# Patient Record
Sex: Female | Born: 1980
Health system: Southern US, Community
[De-identification: ages and names within clinical notes are randomized; demographics above are authoritative.]

## PROBLEM LIST (undated history)

## (undated) DIAGNOSIS — Z8669 Personal history of other diseases of the nervous system and sense organs: Secondary | ICD-10-CM

## (undated) DIAGNOSIS — I10 Essential (primary) hypertension: Secondary | ICD-10-CM

## (undated) DIAGNOSIS — N200 Calculus of kidney: Secondary | ICD-10-CM

## (undated) DIAGNOSIS — R519 Headache, unspecified: Secondary | ICD-10-CM

## (undated) HISTORY — PX: KIDNEY STONE SURGERY: SHX686

## (undated) HISTORY — DX: Personal history of other diseases of the nervous system and sense organs: Z86.69

---

## 2010-08-22 HISTORY — PX: TUBAL LIGATION: SHX77

## 2015-12-14 DIAGNOSIS — D259 Leiomyoma of uterus, unspecified: Secondary | ICD-10-CM | POA: Insufficient documentation

## 2015-12-14 DIAGNOSIS — Z8679 Personal history of other diseases of the circulatory system: Secondary | ICD-10-CM | POA: Insufficient documentation

## 2015-12-14 DIAGNOSIS — N83209 Unspecified ovarian cyst, unspecified side: Secondary | ICD-10-CM | POA: Insufficient documentation

## 2015-12-14 DIAGNOSIS — M222X1 Patellofemoral disorders, right knee: Secondary | ICD-10-CM | POA: Insufficient documentation

## 2016-04-28 ENCOUNTER — Other Ambulatory Visit: Payer: Self-pay | Admitting: Internal Medicine

## 2016-04-28 DIAGNOSIS — N6310 Unspecified lump in the right breast, unspecified quadrant: Secondary | ICD-10-CM

## 2016-05-03 ENCOUNTER — Ambulatory Visit
Admission: RE | Admit: 2016-05-03 | Discharge: 2016-05-03 | Disposition: A | Discharge status: Home / Self Care | Payer: Commercial Managed Care - PPO | Source: Ambulatory Visit | Attending: Internal Medicine | Admitting: Internal Medicine

## 2016-05-03 ENCOUNTER — Other Ambulatory Visit: Payer: Self-pay | Admitting: Internal Medicine

## 2016-05-03 DIAGNOSIS — N6310 Unspecified lump in the right breast, unspecified quadrant: Secondary | ICD-10-CM

## 2016-05-26 ENCOUNTER — Other Ambulatory Visit: Payer: Self-pay | Admitting: Internal Medicine

## 2016-05-26 DIAGNOSIS — N6310 Unspecified lump in the right breast, unspecified quadrant: Secondary | ICD-10-CM

## 2016-05-27 ENCOUNTER — Ambulatory Visit
Admission: RE | Admit: 2016-05-27 | Discharge: 2016-05-27 | Disposition: A | Discharge status: Home / Self Care | Payer: Commercial Managed Care - PPO | Source: Ambulatory Visit | Attending: Internal Medicine | Admitting: Internal Medicine

## 2016-05-27 DIAGNOSIS — N6310 Unspecified lump in the right breast, unspecified quadrant: Secondary | ICD-10-CM

## 2016-06-06 DIAGNOSIS — D241 Benign neoplasm of right breast: Secondary | ICD-10-CM | POA: Insufficient documentation

## 2016-11-04 DIAGNOSIS — G43719 Chronic migraine without aura, intractable, without status migrainosus: Secondary | ICD-10-CM | POA: Diagnosis not present

## 2016-11-04 DIAGNOSIS — M791 Myalgia: Secondary | ICD-10-CM | POA: Diagnosis not present

## 2016-11-04 DIAGNOSIS — M542 Cervicalgia: Secondary | ICD-10-CM | POA: Diagnosis not present

## 2016-11-04 DIAGNOSIS — R51 Headache: Secondary | ICD-10-CM | POA: Diagnosis not present

## 2016-12-05 DIAGNOSIS — Z124 Encounter for screening for malignant neoplasm of cervix: Secondary | ICD-10-CM | POA: Diagnosis not present

## 2016-12-05 DIAGNOSIS — Z01419 Encounter for gynecological examination (general) (routine) without abnormal findings: Secondary | ICD-10-CM | POA: Diagnosis not present

## 2017-01-04 DIAGNOSIS — N760 Acute vaginitis: Secondary | ICD-10-CM | POA: Diagnosis not present

## 2017-01-04 DIAGNOSIS — E559 Vitamin D deficiency, unspecified: Secondary | ICD-10-CM | POA: Diagnosis not present

## 2017-01-09 DIAGNOSIS — R05 Cough: Secondary | ICD-10-CM | POA: Diagnosis not present

## 2017-01-09 DIAGNOSIS — M79643 Pain in unspecified hand: Secondary | ICD-10-CM | POA: Diagnosis not present

## 2017-01-09 DIAGNOSIS — I10 Essential (primary) hypertension: Secondary | ICD-10-CM | POA: Diagnosis not present

## 2017-01-09 DIAGNOSIS — R7309 Other abnormal glucose: Secondary | ICD-10-CM | POA: Diagnosis not present

## 2017-01-09 DIAGNOSIS — M79642 Pain in left hand: Secondary | ICD-10-CM | POA: Diagnosis not present

## 2017-01-09 DIAGNOSIS — M79641 Pain in right hand: Secondary | ICD-10-CM | POA: Diagnosis not present

## 2017-01-31 DIAGNOSIS — M791 Myalgia: Secondary | ICD-10-CM | POA: Diagnosis not present

## 2017-01-31 DIAGNOSIS — R51 Headache: Secondary | ICD-10-CM | POA: Diagnosis not present

## 2017-01-31 DIAGNOSIS — G43719 Chronic migraine without aura, intractable, without status migrainosus: Secondary | ICD-10-CM | POA: Diagnosis not present

## 2017-01-31 DIAGNOSIS — M542 Cervicalgia: Secondary | ICD-10-CM | POA: Diagnosis not present

## 2017-02-02 DIAGNOSIS — G5600 Carpal tunnel syndrome, unspecified upper limb: Secondary | ICD-10-CM | POA: Diagnosis not present

## 2017-04-10 DIAGNOSIS — E559 Vitamin D deficiency, unspecified: Secondary | ICD-10-CM | POA: Diagnosis not present

## 2017-05-01 DIAGNOSIS — N760 Acute vaginitis: Secondary | ICD-10-CM | POA: Diagnosis not present

## 2017-05-01 DIAGNOSIS — Z0000 Encounter for general adult medical examination without abnormal findings: Secondary | ICD-10-CM | POA: Diagnosis not present

## 2017-05-05 DIAGNOSIS — M542 Cervicalgia: Secondary | ICD-10-CM | POA: Diagnosis not present

## 2017-05-05 DIAGNOSIS — R51 Headache: Secondary | ICD-10-CM | POA: Diagnosis not present

## 2017-05-05 DIAGNOSIS — G43719 Chronic migraine without aura, intractable, without status migrainosus: Secondary | ICD-10-CM | POA: Diagnosis not present

## 2017-05-05 DIAGNOSIS — M791 Myalgia: Secondary | ICD-10-CM | POA: Diagnosis not present

## 2017-08-11 DIAGNOSIS — M791 Myalgia, unspecified site: Secondary | ICD-10-CM | POA: Diagnosis not present

## 2017-08-11 DIAGNOSIS — M542 Cervicalgia: Secondary | ICD-10-CM | POA: Diagnosis not present

## 2017-08-11 DIAGNOSIS — G43719 Chronic migraine without aura, intractable, without status migrainosus: Secondary | ICD-10-CM | POA: Diagnosis not present

## 2017-08-11 DIAGNOSIS — R51 Headache: Secondary | ICD-10-CM | POA: Diagnosis not present

## 2017-10-12 DIAGNOSIS — G43719 Chronic migraine without aura, intractable, without status migrainosus: Secondary | ICD-10-CM | POA: Diagnosis not present

## 2017-10-12 DIAGNOSIS — M542 Cervicalgia: Secondary | ICD-10-CM | POA: Diagnosis not present

## 2017-10-12 DIAGNOSIS — R51 Headache: Secondary | ICD-10-CM | POA: Diagnosis not present

## 2017-10-12 DIAGNOSIS — M791 Myalgia, unspecified site: Secondary | ICD-10-CM | POA: Diagnosis not present

## 2017-11-21 DIAGNOSIS — M791 Myalgia, unspecified site: Secondary | ICD-10-CM | POA: Diagnosis not present

## 2017-11-21 DIAGNOSIS — M542 Cervicalgia: Secondary | ICD-10-CM | POA: Diagnosis not present

## 2017-11-21 DIAGNOSIS — G43719 Chronic migraine without aura, intractable, without status migrainosus: Secondary | ICD-10-CM | POA: Diagnosis not present

## 2017-11-21 DIAGNOSIS — R51 Headache: Secondary | ICD-10-CM | POA: Diagnosis not present

## 2017-12-11 DIAGNOSIS — Z01419 Encounter for gynecological examination (general) (routine) without abnormal findings: Secondary | ICD-10-CM | POA: Diagnosis not present

## 2017-12-11 DIAGNOSIS — Z124 Encounter for screening for malignant neoplasm of cervix: Secondary | ICD-10-CM | POA: Diagnosis not present

## 2017-12-11 DIAGNOSIS — E559 Vitamin D deficiency, unspecified: Secondary | ICD-10-CM | POA: Diagnosis not present

## 2017-12-11 DIAGNOSIS — R87610 Atypical squamous cells of undetermined significance on cytologic smear of cervix (ASC-US): Secondary | ICD-10-CM | POA: Diagnosis not present

## 2018-03-13 DIAGNOSIS — R51 Headache: Secondary | ICD-10-CM | POA: Diagnosis not present

## 2018-03-13 DIAGNOSIS — G43719 Chronic migraine without aura, intractable, without status migrainosus: Secondary | ICD-10-CM | POA: Diagnosis not present

## 2018-03-13 DIAGNOSIS — M542 Cervicalgia: Secondary | ICD-10-CM | POA: Diagnosis not present

## 2018-03-13 DIAGNOSIS — M791 Myalgia, unspecified site: Secondary | ICD-10-CM | POA: Diagnosis not present

## 2018-05-04 ENCOUNTER — Other Ambulatory Visit: Payer: Self-pay

## 2018-05-04 ENCOUNTER — Emergency Department (HOSPITAL_COMMUNITY)
Admission: EM | Admit: 2018-05-04 | Discharge: 2018-05-05 | Disposition: A | Discharge status: Home / Self Care | Payer: Commercial Managed Care - PPO | Attending: Emergency Medicine | Admitting: Emergency Medicine

## 2018-05-04 ENCOUNTER — Encounter (HOSPITAL_COMMUNITY): Payer: Self-pay | Admitting: Emergency Medicine

## 2018-05-04 DIAGNOSIS — R1031 Right lower quadrant pain: Secondary | ICD-10-CM | POA: Diagnosis present

## 2018-05-04 DIAGNOSIS — N200 Calculus of kidney: Secondary | ICD-10-CM | POA: Diagnosis not present

## 2018-05-04 HISTORY — DX: Calculus of kidney: N20.0

## 2018-05-04 LAB — LIPASE, BLOOD: LIPASE: 27 U/L (ref 11–51)

## 2018-05-04 LAB — COMPREHENSIVE METABOLIC PANEL
ALT: 17 U/L (ref 0–44)
AST: 23 U/L (ref 15–41)
Albumin: 4.1 g/dL (ref 3.5–5.0)
Alkaline Phosphatase: 63 U/L (ref 38–126)
Anion gap: 12 (ref 5–15)
BILIRUBIN TOTAL: 0.7 mg/dL (ref 0.3–1.2)
BUN: 14 mg/dL (ref 6–20)
CHLORIDE: 111 mmol/L (ref 98–111)
CO2: 17 mmol/L — ABNORMAL LOW (ref 22–32)
Calcium: 9.3 mg/dL (ref 8.9–10.3)
Creatinine, Ser: 1.24 mg/dL — ABNORMAL HIGH (ref 0.44–1.00)
GFR calc Af Amer: >60 mL/min (ref >60)
GFR calc non Af Amer: 55 mL/min — ABNORMAL LOW (ref >60)
GLUCOSE: 146 mg/dL — AB (ref 70–99)
POTASSIUM: 3.3 mmol/L — AB (ref 3.5–5.1)
Sodium: 140 mmol/L (ref 135–145)
TOTAL PROTEIN: 7.4 g/dL (ref 6.5–8.1)

## 2018-05-04 LAB — URINALYSIS, ROUTINE W REFLEX MICROSCOPIC
Bacteria, UA: NONE SEEN
Bilirubin Urine: NEGATIVE
GLUCOSE, UA: NEGATIVE mg/dL
Ketones, ur: NEGATIVE mg/dL
Leukocytes, UA: NEGATIVE
NITRITE: NEGATIVE
Protein, ur: NEGATIVE mg/dL
SPECIFIC GRAVITY, URINE: 1.013 (ref 1.005–1.030)
pH: 7 (ref 5.0–8.0)

## 2018-05-04 LAB — I-STAT BETA HCG BLOOD, ED (MC, WL, AP ONLY): I-stat hCG, quantitative: <5 m[IU]/mL (ref <5)

## 2018-05-04 LAB — CBC
HEMATOCRIT: 38.4 % (ref 36.0–46.0)
HEMOGLOBIN: 12.3 g/dL (ref 12.0–15.0)
MCH: 30.2 pg (ref 26.0–34.0)
MCHC: 32 g/dL (ref 30.0–36.0)
MCV: 94.3 fL (ref 78.0–100.0)
Platelets: 296 10*3/uL (ref 150–400)
RBC: 4.07 MIL/uL (ref 3.87–5.11)
RDW: 11.7 % (ref 11.5–15.5)
WBC: 15.7 10*3/uL — ABNORMAL HIGH (ref 4.0–10.5)

## 2018-05-04 MED ORDER — ONDANSETRON 4 MG PO TBDP
4.0000 mg | ORAL_TABLET | Freq: Once | ORAL | Status: AC | PRN
  Administered 2018-05-04: 4 mg via ORAL
  Filled 2018-05-04: qty 1

## 2018-05-04 MED ORDER — OXYCODONE-ACETAMINOPHEN 5-325 MG PO TABS
1.0000 | ORAL_TABLET | ORAL | Status: DC | PRN
  Administered 2018-05-04: 1 via ORAL
  Filled 2018-05-04: qty 1

## 2018-05-04 NOTE — ED Triage Notes (Signed)
Pt reports sharp, shooting RLQ pain, radiating R flank pain. Pt reports 13 episodes of emesis. Pt reports hx of kidney stones. Pt reports son was sick 1 week ago with same symptoms.

## 2018-05-05 ENCOUNTER — Emergency Department (HOSPITAL_COMMUNITY): Payer: Commercial Managed Care - PPO

## 2018-05-05 DIAGNOSIS — K449 Diaphragmatic hernia without obstruction or gangrene: Secondary | ICD-10-CM | POA: Diagnosis not present

## 2018-05-05 DIAGNOSIS — N200 Calculus of kidney: Secondary | ICD-10-CM | POA: Diagnosis not present

## 2018-05-05 MED ORDER — HYDROCODONE-ACETAMINOPHEN 5-325 MG PO TABS: 1.0000 | ORAL_TABLET | Freq: Four times a day (QID) | ORAL | 0 refills | Status: DC | PRN

## 2018-05-05 MED ORDER — KETOROLAC TROMETHAMINE 30 MG/ML IJ SOLN
30.0000 mg | Freq: Once | INTRAMUSCULAR | Status: AC
  Administered 2018-05-05: 30 mg via INTRAVENOUS
  Filled 2018-05-05: qty 1

## 2018-05-05 MED ORDER — ONDANSETRON HCL 4 MG/2ML IJ SOLN
4.0000 mg | Freq: Once | INTRAMUSCULAR | Status: AC
  Administered 2018-05-05: 4 mg via INTRAVENOUS
  Filled 2018-05-05: qty 2

## 2018-05-05 MED ORDER — ONDANSETRON 4 MG PO TBDP: 4.0000 mg | ORAL_TABLET | Freq: Three times a day (TID) | ORAL | 0 refills | Status: DC | PRN

## 2018-05-05 MED ORDER — HYDROMORPHONE HCL 1 MG/ML IJ SOLN
1.0000 mg | Freq: Once | INTRAMUSCULAR | Status: AC
  Administered 2018-05-05: 1 mg via INTRAVENOUS
  Filled 2018-05-05: qty 1

## 2018-05-05 NOTE — ED Provider Notes (Signed)
[facility] EMERGENCY DEPARTMENT Provider Note   CSN: [id number] Arrival date & time: 05/04/18  2116     History   Chief Complaint Chief Complaint  Patient presents with  . Abdominal Pain  . Emesis    HPI Wendy Parks is a 37 y.o. female.  Patient presents to the emergency department with a chief complaint of abdominal pain.  She reports having right flank pain.  States that the symptoms started today.  She rates her pain is a 10 out of 10.  She states that it feels like prior kidney stone.  She reports urinary hesitancy.  She denies any dysuria.  She reports associated nausea, but denies any vomiting or diarrhea.  She denies any vaginal discharge or bleeding.  She has not taken anything for symptoms.  She reports that her prior kidney stones required surgical intervention.  The history is provided by the patient. No language interpreter was used.    Past Medical History:  Diagnosis Date  . Kidney stone     There are no active problems to display for this patient.   Past Surgical History:  Procedure Laterality Date  . KIDNEY STONE SURGERY    . TUBAL LIGATION  2012     OB History   None      Home Medications    Prior to Admission medications   Not on File    Family History History reviewed. No pertinent family history.  Social History Social History   Tobacco Use  . Smoking status: Never Smoker  . Smokeless tobacco: Never Used  Substance Use Topics  . Alcohol use: Yes    Comment: occasionally  . Drug use: Never     Allergies   Patient has no known allergies.   Review of Systems Review of Systems  All other systems reviewed and are negative.    Physical Exam Updated Vital Signs BP 130/81   Pulse 71   Temp 98.6 F (37 C)   Resp 18   Ht 5\' 6"  (1.676 m)   Wt 79.8 kg   LMP 04/03/2018   SpO2 100%   BMI 28.41 kg/m   Physical Exam  Constitutional: She is oriented to person, place, and time. She appears  well-developed and well-nourished.  HENT:  Head: Normocephalic and atraumatic.  Eyes: Pupils are equal, round, and reactive to light. Conjunctivae and EOM are normal.  Neck: Normal range of motion. Neck supple.  Cardiovascular: Normal rate and regular rhythm. Exam reveals no gallop and no friction rub.  No murmur heard. Pulmonary/Chest: Effort normal and breath sounds normal. No respiratory distress. She has no wheezes. She has no rales. She exhibits no tenderness.  Abdominal: Soft. Bowel sounds are normal. She exhibits no distension and no mass. There is tenderness. There is no rebound and no guarding.  Generalized abdominal discomfort Right CVA tenderness  Musculoskeletal: Normal range of motion. She exhibits no edema or tenderness.  Neurological: She is alert and oriented to person, place, and time.  Skin: Skin is warm and dry.  Psychiatric: She has a normal mood and affect. Her behavior is normal. Judgment and thought content normal.  Nursing note and vitals reviewed.    ED Treatments / Results  Labs (all labs ordered are listed, but only abnormal results are displayed) Labs Reviewed  COMPREHENSIVE METABOLIC PANEL - Abnormal; Notable for the following components:      Result Value   Potassium 3.3 (*)    CO2 17 (*)    Glucose,  Bld 146 (*)    Creatinine, Ser 1.24 (*)    GFR calc non Af Amer 55 (*)    All other components within normal limits  CBC - Abnormal; Notable for the following components:   WBC 15.7 (*)    All other components within normal limits  URINALYSIS, ROUTINE W REFLEX MICROSCOPIC - Abnormal; Notable for the following components:   APPearance CLOUDY (*)    Hgb urine dipstick SMALL (*)    All other components within normal limits  LIPASE, BLOOD  I-STAT BETA HCG BLOOD, ED ([facility], [facility], [facility] ONLY)    EKG None  Radiology Ct Renal Stone Study  Result Date: 05/05/2018 CLINICAL DATA:  Right lower quadrant and right flank pain. Emesis. History kidney stones. EXAM: CT  ABDOMEN AND PELVIS WITHOUT CONTRAST TECHNIQUE: Multidetector CT imaging of the abdomen and pelvis was performed following the standard protocol without IV contrast. COMPARISON:  None. FINDINGS: Lower chest: Clear lung bases. Normal heart size without pericardial or pleural effusion. Hepatobiliary: Normal liver. Normal gallbladder, without biliary ductal dilatation. Pancreas: Normal, without mass or ductal dilatation. Spleen: Normal in size, without focal abnormality. Adrenals/Urinary Tract: Normal adrenal glands. No renal calculi. Mild to moderate right renal and perirenal edema. Right hydroureter and Periureteric edema. Hydroureter is followed to the level of a distal right ureteric 5 mm stone on image 79/3 and coronal image 58. No bladder calculi. Stomach/Bowel: Tiny hiatal hernia. Normal colon, appendix, and terminal ileum. Normal small bowel. Vascular/Lymphatic: Normal caliber of the aorta and branch vessels. No abdominopelvic adenopathy. Reproductive: Lobulated uterus, consistent with underlying fibroids. A uterine body lesion is exophytic inferiorly and measures 5.2 x 3.2 cm on sagittal image 60. No adnexal mass. Other: Trace free pelvic fluid is likely physiologic. Prior tubal ligation. Musculoskeletal: No acute osseous abnormality. IMPRESSION: 1. Mild to moderate urinary tract obstruction secondary to a distal right ureteric 5 mm stone. 2. Fibroid uterus. 3.  Tiny hiatal hernia. Electronically Signed   By: Abigail Miyamoto M.D.   On: 05/05/2018 03:02    Procedures Procedures (including critical care time)  Medications Ordered in ED Medications  oxyCODONE-acetaminophen (PERCOCET/ROXICET) 5-325 MG per tablet 1 tablet (1 tablet Oral Given 05/04/18 2132)  ondansetron (ZOFRAN-ODT) disintegrating tablet 4 mg (4 mg Oral Given 05/04/18 2132)  HYDROmorphone (DILAUDID) injection 1 mg (1 mg Intravenous Given 05/05/18 0233)  ondansetron (ZOFRAN) injection 4 mg (4 mg Intravenous Given 05/05/18 0233)     Initial  Impression / Assessment and Plan / ED Course  I have reviewed the triage vital signs and the nursing notes.  Pertinent labs & imaging results that were available during my care of the patient were reviewed by me and considered in my medical decision making (see chart for details).     Patient with right flank pain and generalized abdominal pain.  History of kidney stones. States that this feels similar.  Leukocytosis to 15.7.  He does have hemoglobin and RBCs in her urine.  Given that she has required surgical intervention for kidney stones in the past, and she does have significant abdominal tenderness, feel that CT is indicated for further differentiation.  CT shows right 5 mm ureteral stone.  Pain significantly improved in ED.  Urinalysis is not consistent with infection.  Leukocytosis likely reactive.  Will refer to urology.  Will discharge home with pain and nausea medicine.  Return precautions given.  Final Clinical Impressions(s) / ED Diagnoses   Final diagnoses:  Kidney stone    ED Discharge Orders  Ordered    HYDROcodone-acetaminophen (NORCO/VICODIN) 5-325 MG tablet  Every 6 hours PRN     05/05/18 0334    ondansetron (ZOFRAN ODT) 4 MG disintegrating tablet  Every 8 hours PRN     05/05/18 0334           Montine Circle, PA-C 05/05/18 0335    Veryl Speak, MD 05/05/18 [phone]

## 2018-05-08 DIAGNOSIS — Z09 Encounter for follow-up examination after completed treatment for conditions other than malignant neoplasm: Secondary | ICD-10-CM | POA: Diagnosis not present

## 2018-05-08 DIAGNOSIS — N200 Calculus of kidney: Secondary | ICD-10-CM | POA: Diagnosis not present

## 2018-05-08 DIAGNOSIS — Z1231 Encounter for screening mammogram for malignant neoplasm of breast: Secondary | ICD-10-CM | POA: Diagnosis not present

## 2018-05-08 DIAGNOSIS — Z0000 Encounter for general adult medical examination without abnormal findings: Secondary | ICD-10-CM | POA: Diagnosis not present

## 2018-05-09 DIAGNOSIS — N201 Calculus of ureter: Secondary | ICD-10-CM | POA: Diagnosis not present

## 2018-06-15 DIAGNOSIS — R51 Headache: Secondary | ICD-10-CM | POA: Diagnosis not present

## 2018-06-15 DIAGNOSIS — M542 Cervicalgia: Secondary | ICD-10-CM | POA: Diagnosis not present

## 2018-06-15 DIAGNOSIS — G43719 Chronic migraine without aura, intractable, without status migrainosus: Secondary | ICD-10-CM | POA: Diagnosis not present

## 2018-06-15 DIAGNOSIS — M791 Myalgia, unspecified site: Secondary | ICD-10-CM | POA: Diagnosis not present

## 2018-09-07 DIAGNOSIS — G518 Other disorders of facial nerve: Secondary | ICD-10-CM | POA: Diagnosis not present

## 2018-09-07 DIAGNOSIS — M542 Cervicalgia: Secondary | ICD-10-CM | POA: Diagnosis not present

## 2018-09-07 DIAGNOSIS — M791 Myalgia, unspecified site: Secondary | ICD-10-CM | POA: Diagnosis not present

## 2018-09-07 DIAGNOSIS — G43719 Chronic migraine without aura, intractable, without status migrainosus: Secondary | ICD-10-CM | POA: Diagnosis not present

## 2018-10-17 DIAGNOSIS — H04123 Dry eye syndrome of bilateral lacrimal glands: Secondary | ICD-10-CM | POA: Diagnosis not present

## 2018-10-17 DIAGNOSIS — H02824 Cysts of left upper eyelid: Secondary | ICD-10-CM | POA: Diagnosis not present

## 2018-11-29 DIAGNOSIS — H0014 Chalazion left upper eyelid: Secondary | ICD-10-CM | POA: Diagnosis not present

## 2018-11-29 DIAGNOSIS — D485 Neoplasm of uncertain behavior of skin: Secondary | ICD-10-CM | POA: Diagnosis not present

## 2018-12-07 DIAGNOSIS — M791 Myalgia, unspecified site: Secondary | ICD-10-CM | POA: Diagnosis not present

## 2018-12-07 DIAGNOSIS — M542 Cervicalgia: Secondary | ICD-10-CM | POA: Diagnosis not present

## 2018-12-07 DIAGNOSIS — G43719 Chronic migraine without aura, intractable, without status migrainosus: Secondary | ICD-10-CM | POA: Diagnosis not present

## 2018-12-07 DIAGNOSIS — G518 Other disorders of facial nerve: Secondary | ICD-10-CM | POA: Diagnosis not present

## 2018-12-25 DIAGNOSIS — Z124 Encounter for screening for malignant neoplasm of cervix: Secondary | ICD-10-CM | POA: Diagnosis not present

## 2018-12-25 DIAGNOSIS — E559 Vitamin D deficiency, unspecified: Secondary | ICD-10-CM | POA: Diagnosis not present

## 2018-12-25 DIAGNOSIS — B009 Herpesviral infection, unspecified: Secondary | ICD-10-CM | POA: Insufficient documentation

## 2018-12-25 DIAGNOSIS — R87610 Atypical squamous cells of undetermined significance on cytologic smear of cervix (ASC-US): Secondary | ICD-10-CM | POA: Diagnosis not present

## 2018-12-25 DIAGNOSIS — Z01419 Encounter for gynecological examination (general) (routine) without abnormal findings: Secondary | ICD-10-CM | POA: Diagnosis not present

## 2018-12-25 DIAGNOSIS — N920 Excessive and frequent menstruation with regular cycle: Secondary | ICD-10-CM | POA: Diagnosis not present

## 2018-12-25 DIAGNOSIS — Z113 Encounter for screening for infections with a predominantly sexual mode of transmission: Secondary | ICD-10-CM | POA: Diagnosis not present

## 2018-12-25 DIAGNOSIS — D259 Leiomyoma of uterus, unspecified: Secondary | ICD-10-CM | POA: Diagnosis not present

## 2018-12-25 HISTORY — DX: Herpesviral infection, unspecified: B00.9

## 2019-01-07 ENCOUNTER — Other Ambulatory Visit: Payer: Self-pay | Admitting: Obstetrics and Gynecology

## 2019-01-07 DIAGNOSIS — N920 Excessive and frequent menstruation with regular cycle: Secondary | ICD-10-CM | POA: Diagnosis not present

## 2019-01-21 DIAGNOSIS — Z1159 Encounter for screening for other viral diseases: Secondary | ICD-10-CM | POA: Diagnosis not present

## 2019-01-21 HISTORY — PX: ENDOMETRIAL ABLATION: SHX621

## 2019-01-25 ENCOUNTER — Other Ambulatory Visit: Payer: Self-pay | Admitting: Obstetrics and Gynecology

## 2019-01-25 DIAGNOSIS — N920 Excessive and frequent menstruation with regular cycle: Secondary | ICD-10-CM | POA: Diagnosis not present

## 2019-03-08 DIAGNOSIS — M791 Myalgia, unspecified site: Secondary | ICD-10-CM | POA: Diagnosis not present

## 2019-03-08 DIAGNOSIS — G518 Other disorders of facial nerve: Secondary | ICD-10-CM | POA: Diagnosis not present

## 2019-03-08 DIAGNOSIS — M542 Cervicalgia: Secondary | ICD-10-CM | POA: Diagnosis not present

## 2019-03-08 DIAGNOSIS — G43719 Chronic migraine without aura, intractable, without status migrainosus: Secondary | ICD-10-CM | POA: Diagnosis not present

## 2019-05-23 ENCOUNTER — Other Ambulatory Visit: Payer: Self-pay

## 2019-05-23 ENCOUNTER — Telehealth: Payer: Commercial Managed Care - PPO | Admitting: Nurse Practitioner

## 2019-05-23 DIAGNOSIS — Z20822 Contact with and (suspected) exposure to covid-19: Secondary | ICD-10-CM

## 2019-05-23 DIAGNOSIS — Z20828 Contact with and (suspected) exposure to other viral communicable diseases: Secondary | ICD-10-CM | POA: Diagnosis not present

## 2019-05-24 LAB — NOVEL CORONAVIRUS, NAA: SARS-CoV-2, NAA: NOT DETECTED

## 2019-05-27 ENCOUNTER — Encounter: Payer: Commercial Managed Care - PPO | Admitting: Internal Medicine

## 2019-06-14 DIAGNOSIS — M791 Myalgia, unspecified site: Secondary | ICD-10-CM | POA: Diagnosis not present

## 2019-06-14 DIAGNOSIS — G43719 Chronic migraine without aura, intractable, without status migrainosus: Secondary | ICD-10-CM | POA: Diagnosis not present

## 2019-06-14 DIAGNOSIS — M542 Cervicalgia: Secondary | ICD-10-CM | POA: Diagnosis not present

## 2019-06-14 DIAGNOSIS — G518 Other disorders of facial nerve: Secondary | ICD-10-CM | POA: Diagnosis not present

## 2019-06-19 ENCOUNTER — Encounter: Payer: Self-pay | Admitting: Internal Medicine

## 2019-06-20 ENCOUNTER — Other Ambulatory Visit: Payer: Self-pay

## 2019-06-20 ENCOUNTER — Other Ambulatory Visit: Payer: BC Managed Care – PPO

## 2019-06-24 ENCOUNTER — Other Ambulatory Visit: Payer: Self-pay

## 2019-06-24 ENCOUNTER — Ambulatory Visit: Payer: BC Managed Care – PPO | Admitting: Internal Medicine

## 2019-06-24 ENCOUNTER — Encounter: Payer: Self-pay | Admitting: Internal Medicine

## 2019-06-24 VITALS — BP 110/84 | HR 90 | Temp 98.7°F | Ht 66.5 in | Wt 192.2 lb

## 2019-06-24 DIAGNOSIS — Z0000 Encounter for general adult medical examination without abnormal findings: Secondary | ICD-10-CM

## 2019-06-24 DIAGNOSIS — N200 Calculus of kidney: Secondary | ICD-10-CM | POA: Insufficient documentation

## 2019-06-24 LAB — COMPREHENSIVE METABOLIC PANEL
ALT: 9 IU/L (ref 0–32)
AST: 12 IU/L (ref 0–40)
Albumin/Globulin Ratio: 1.6 (ref 1.2–2.2)
Albumin: 4.6 g/dL (ref 3.8–4.8)
Alkaline Phosphatase: 79 IU/L (ref 39–117)
BUN/Creatinine Ratio: 18 (ref 9–23)
BUN: 17 mg/dL (ref 6–20)
Bilirubin Total: 0.4 mg/dL (ref 0.0–1.2)
CO2: 19 mmol/L — ABNORMAL LOW (ref 20–29)
Calcium: 9.6 mg/dL (ref 8.7–10.2)
Chloride: 108 mmol/L — ABNORMAL HIGH (ref 96–106)
Creatinine, Ser: 0.96 mg/dL (ref 0.57–1.00)
GFR calc Af Amer: 87 mL/min/{1.73_m2} (ref >59)
GFR calc non Af Amer: 75 mL/min/{1.73_m2} (ref >59)
Globulin, Total: 2.9 g/dL (ref 1.5–4.5)
Glucose: 95 mg/dL (ref 65–99)
Potassium: 4.1 mmol/L (ref 3.5–5.2)
Sodium: 140 mmol/L (ref 134–144)
Total Protein: 7.5 g/dL (ref 6.0–8.5)

## 2019-06-24 LAB — LIPID PANEL W/O CHOL/HDL RATIO
Cholesterol, Total: 181 mg/dL (ref 100–199)
HDL: 65 mg/dL (ref >39)
LDL Chol Calc (NIH): 102 mg/dL — ABNORMAL HIGH (ref 0–99)
Triglycerides: 74 mg/dL (ref 0–149)
VLDL Cholesterol Cal: 14 mg/dL (ref 5–40)

## 2019-06-24 LAB — CBC
Hematocrit: 38.6 % (ref 34.0–46.6)
Hemoglobin: 13.1 g/dL (ref 11.1–15.9)
MCH: 29.7 pg (ref 26.6–33.0)
MCHC: 33.9 g/dL (ref 31.5–35.7)
MCV: 88 fL (ref 79–97)
Platelets: 302 10*3/uL (ref 150–450)
RBC: 4.41 x10E6/uL (ref 3.77–5.28)
RDW: 11.8 % (ref 11.7–15.4)
WBC: 11.1 10*3/uL — ABNORMAL HIGH (ref 3.4–10.8)

## 2019-06-24 LAB — POCT URINALYSIS DIPSTICK
Bilirubin, UA: NEGATIVE
Blood, UA: NEGATIVE
Glucose, UA: NEGATIVE
Ketones, UA: NEGATIVE
Leukocytes, UA: NEGATIVE
Nitrite, UA: NEGATIVE
Protein, UA: NEGATIVE
Spec Grav, UA: 1.025 (ref 1.010–1.025)
Urobilinogen, UA: 0.2 E.U./dL
pH, UA: 6.5 (ref 5.0–8.0)

## 2019-06-24 LAB — HGB A1C W/O EAG: Hgb A1c MFr Bld: 5.7 % — ABNORMAL HIGH (ref 4.8–5.6)

## 2019-06-24 NOTE — Progress Notes (Signed)
Subjective:     Patient ID: Wendy Parks , female    DOB: [birth year] , 38 y.o.   MRN: [id number]   Chief Complaint  Patient presents with  . Annual Exam    HPI  She is here today for a full physical examination.  She is followed by Dr. Mancel Bale for her Gyn care. She reports having pap smear performed Spring 2020 and had an endometrial ablation in June 2020. She has no specific concerns at this time. She does not wish to get the flu shot.     Past Medical History:  Diagnosis Date  . Kidney stone      History reviewed. No pertinent family history.   Current Outpatient Medications:  .  baclofen (LIORESAL) 10 MG tablet, Take 10 mg by mouth 2 (two) times daily as needed., Disp: , Rfl:  .  topiramate ER (QUDEXY XR) 200 MG CS24 sprinkle capsule, Qudexy XR 200 mg capsule sprinkle,extended release  TAKE 1 BY MOUTH ONCE DAILY, Disp: , Rfl:    No Known Allergies    The patient states she uses none for birth control. Last LMP was Patient's last menstrual period was 01/25/2019.. Negative for Dysmenorrhea @MAMMOFINDINGS @. Negative for: breast discharge, breast lump(s), breast pain and breast self exam. Associated symptoms include abnormal vaginal bleeding. Pertinent negatives include abnormal bleeding (hematology), anxiety, decreased libido, depression, difficulty falling sleep, dyspareunia, history of infertility, nocturia, sexual dysfunction, sleep disturbances, urinary incontinence, urinary urgency, vaginal discharge and vaginal itching. Diet regular.The patient states her exercise level is  intermittent.   . The patient's tobacco use is:  Social History   Tobacco Use  Smoking Status Never Smoker  Smokeless Tobacco Never Used  . She has been exposed to passive smoke. The patient's alcohol use is:  Social History   Substance and Sexual Activity  Alcohol Use Yes   Comment: occasionally  . Additional information: Last pap May 2020.    Review of Systems  Constitutional: Negative.    HENT: Negative.   Eyes: Negative.   Respiratory: Negative.   Cardiovascular: Negative.   Endocrine: Negative.   Genitourinary: Negative.   Musculoskeletal: Negative.   Skin: Negative.   Allergic/Immunologic: Negative.   Neurological: Negative.   Hematological: Negative.   Psychiatric/Behavioral: Negative.      Today's Vitals   06/24/19 1157  BP: 110/84  Pulse: 90  Temp: 98.7 F (37.1 C)  TempSrc: Oral  Weight: 192 lb 3.2 oz (87.2 kg)  Height: 5' 6.5" (1.689 m)   Body mass index is 30.56 kg/m.   Objective:  Physical Exam Vitals signs and nursing note reviewed.  Constitutional:      Appearance: Normal appearance.  HENT:     Head: Normocephalic and atraumatic.     Right Ear: Tympanic membrane, ear canal and external ear normal.     Left Ear: Tympanic membrane, ear canal and external ear normal.     Nose:     Comments: Deferred, masked    Mouth/Throat:     Comments: Deferred, masked.  Eyes:     Extraocular Movements: Extraocular movements intact.     Conjunctiva/sclera: Conjunctivae normal.     Pupils: Pupils are equal, round, and reactive to light.  Neck:     Musculoskeletal: Normal range of motion and neck supple.  Cardiovascular:     Rate and Rhythm: Normal rate and regular rhythm.     Pulses: Normal pulses.     Heart sounds: Normal heart sounds.  Pulmonary:     Effort: Pulmonary  effort is normal.     Breath sounds: Normal breath sounds.  Chest:     Breasts: Tanner Score is 5.        Right: Normal.        Left: Normal.  Abdominal:     General: Abdomen is flat. Bowel sounds are normal.     Palpations: Abdomen is soft.  Genitourinary:    Comments: deferred Musculoskeletal: Normal range of motion.  Skin:    General: Skin is warm and dry.  Neurological:     General: No focal deficit present.     Mental Status: She is alert and oriented to person, place, and time.  Psychiatric:        Mood and Affect: Mood normal.        Behavior: Behavior normal.          Assessment And Plan:     1. Routine general medical examination at health care facility  A full exam was performed.  Importance of monthly self breast exams was discussed with the patient. She had her labs drawn last week. We went over her results in full detail. All questions were answered to her satisfaction. She will rto in one year for her next physical exam, or sooner if needed.  PATIENT HAS BEEN ADVISED TO GET 30-45 MINUTES REGULAR EXERCISE NO LESS THAN FOUR TO FIVE DAYS PER WEEK - BOTH WEIGHTBEARING EXERCISES AND AEROBIC ARE RECOMMENDED.  HE/SHE WAS ADVISED TO FOLLOW A HEALTHY DIET WITH AT LEAST SIX FRUITS/VEGGIES PER DAY, DECREASE INTAKE OF RED MEAT, AND TO INCREASE FISH INTAKE TO TWO DAYS PER WEEK.  MEATS/FISH SHOULD NOT BE FRIED, BAKED OR BROILED IS PREFERABLE.  I SUGGEST WEARING SPF 50 SUNSCREEN ON EXPOSED PARTS AND ESPECIALLY WHEN IN THE DIRECT SUNLIGHT FOR AN EXTENDED PERIOD OF TIME.  PLEASE AVOID FAST FOOD RESTAURANTS AND INCREASE YOUR WATER INTAKE.  - POCT Urinalysis Dipstick (81002)   Maximino Greenland, MD    THE PATIENT IS ENCOURAGED TO PRACTICE SOCIAL DISTANCING DUE TO THE COVID-19 PANDEMIC.

## 2019-06-24 NOTE — Patient Instructions (Signed)
Health Maintenance, Female Adopting a healthy lifestyle and getting preventive care are important in promoting health and wellness. Ask your health care provider about:  The right schedule for you to have regular tests and exams.  Things you can do on your own to prevent diseases and keep yourself healthy. What should I know about diet, weight, and exercise? Eat a healthy diet   Eat a diet that includes plenty of vegetables, fruits, low-fat dairy products, and lean protein.  Do not eat a lot of foods that are high in solid fats, added sugars, or sodium. Maintain a healthy weight Body mass index (BMI) is used to identify weight problems. It estimates body fat based on height and weight. Your health care provider can help determine your BMI and help you achieve or maintain a healthy weight. Get regular exercise Get regular exercise. This is one of the most important things you can do for your health. Most adults should:  Exercise for at least 150 minutes each week. The exercise should increase your heart rate and make you sweat (moderate-intensity exercise).  Do strengthening exercises at least twice a week. This is in addition to the moderate-intensity exercise.  Spend less time sitting. Even light physical activity can be beneficial. Watch cholesterol and blood lipids Have your blood tested for lipids and cholesterol at 38 years of age, then have this test every 5 years. Have your cholesterol levels checked more often if:  Your lipid or cholesterol levels are high.  You are older than 38 years of age.  You are at high risk for heart disease. What should I know about cancer screening? Depending on your health history and family history, you may need to have cancer screening at various ages. This may include screening for:  Breast cancer.  Cervical cancer.  Colorectal cancer.  Skin cancer.  Lung cancer. What should I know about heart disease, diabetes, and high blood  pressure? Blood pressure and heart disease  High blood pressure causes heart disease and increases the risk of stroke. This is more likely to develop in people who have high blood pressure readings, are of African descent, or are overweight.  Have your blood pressure checked: ? Every 3-5 years if you are 18-39 years of age. ? Every year if you are 40 years old or older. Diabetes Have regular diabetes screenings. This checks your fasting blood sugar level. Have the screening done:  Once every three years after age 40 if you are at a normal weight and have a low risk for diabetes.  More often and at a younger age if you are overweight or have a high risk for diabetes. What should I know about preventing infection? Hepatitis B If you have a higher risk for hepatitis B, you should be screened for this virus. Talk with your health care provider to find out if you are at risk for hepatitis B infection. Hepatitis C Testing is recommended for:  Everyone born from 1945 through 1965.  Anyone with known risk factors for hepatitis C. Sexually transmitted infections (STIs)  Get screened for STIs, including gonorrhea and chlamydia, if: ? You are sexually active and are younger than 38 years of age. ? You are older than 38 years of age and your health care provider tells you that you are at risk for this type of infection. ? Your sexual activity has changed since you were last screened, and you are at increased risk for chlamydia or gonorrhea. Ask your health care provider if   you are at risk.  Ask your health care provider about whether you are at high risk for HIV. Your health care provider may recommend a prescription medicine to help prevent HIV infection. If you choose to take medicine to prevent HIV, you should first get tested for HIV. You should then be tested every 3 months for as long as you are taking the medicine. Pregnancy  If you are about to stop having your period (premenopausal) and  you may become pregnant, seek counseling before you get pregnant.  Take 400 to 800 micrograms (mcg) of folic acid every day if you become pregnant.  Ask for birth control (contraception) if you want to prevent pregnancy. Osteoporosis and menopause Osteoporosis is a disease in which the bones lose minerals and strength with aging. This can result in bone fractures. If you are 65 years old or older, or if you are at risk for osteoporosis and fractures, ask your health care provider if you should:  Be screened for bone loss.  Take a calcium or vitamin D supplement to lower your risk of fractures.  Be given hormone replacement therapy (HRT) to treat symptoms of menopause. Follow these instructions at home: Lifestyle  Do not use any products that contain nicotine or tobacco, such as cigarettes, e-cigarettes, and chewing tobacco. If you need help quitting, ask your health care provider.  Do not use street drugs.  Do not share needles.  Ask your health care provider for help if you need support or information about quitting drugs. Alcohol use  Do not drink alcohol if: ? Your health care provider tells you not to drink. ? You are pregnant, may be pregnant, or are planning to become pregnant.  If you drink alcohol: ? Limit how much you use to 0-1 drink a day. ? Limit intake if you are breastfeeding.  Be aware of how much alcohol is in your drink. In the [place] one drink equals one 12 oz bottle of beer (355 mL), one 5 oz glass of wine (148 mL), or one 1 oz glass of hard liquor (44 mL). General instructions  Schedule regular health, dental, and eye exams.  Stay current with your vaccines.  Tell your health care provider if: ? You often feel depressed. ? You have ever been abused or do not feel safe at home. Summary  Adopting a healthy lifestyle and getting preventive care are important in promoting health and wellness.  Follow your health care provider's instructions about healthy  diet, exercising, and getting tested or screened for diseases.  Follow your health care provider's instructions on monitoring your cholesterol and blood pressure. This information is not intended to replace advice given to you by your health care provider. Make sure you discuss any questions you have with your health care provider. Document Released: 02/21/2011 Document Revised: 08/01/2018 Document Reviewed: 08/01/2018 [employer]vier Patient Education  2020 [employer]  

## 2019-06-26 ENCOUNTER — Encounter: Payer: Self-pay | Admitting: Internal Medicine

## 2019-07-23 ENCOUNTER — Encounter: Payer: Commercial Managed Care - PPO | Admitting: Internal Medicine

## 2019-07-23 ENCOUNTER — Other Ambulatory Visit: Payer: Self-pay

## 2019-07-23 DIAGNOSIS — Z20822 Contact with and (suspected) exposure to covid-19: Secondary | ICD-10-CM

## 2019-07-25 ENCOUNTER — Encounter: Payer: Self-pay | Admitting: Internal Medicine

## 2019-07-25 ENCOUNTER — Other Ambulatory Visit: Payer: Self-pay

## 2019-07-25 ENCOUNTER — Telehealth (INDEPENDENT_AMBULATORY_CARE_PROVIDER_SITE_OTHER): Payer: BC Managed Care – PPO | Admitting: Internal Medicine

## 2019-07-25 VITALS — Temp 99.1°F

## 2019-07-25 DIAGNOSIS — U071 COVID-19: Secondary | ICD-10-CM | POA: Diagnosis not present

## 2019-07-25 DIAGNOSIS — R05 Cough: Secondary | ICD-10-CM

## 2019-07-25 DIAGNOSIS — R059 Cough, unspecified: Secondary | ICD-10-CM

## 2019-07-25 LAB — NOVEL CORONAVIRUS, NAA: SARS-CoV-2, NAA: DETECTED — AB

## 2019-07-25 MED ORDER — AZITHROMYCIN 250 MG PO TABS: ORAL_TABLET | ORAL | 0 refills | Status: AC

## 2019-07-25 NOTE — Patient Instructions (Signed)
Oscillococcinum - you may go to [facility]  COVID-19 Frequently Asked Questions COVID-19 (coronavirus disease) is an infection that is caused by a large family of viruses. Some viruses cause illness in people and others cause illness in animals like camels, cats, and bats. In some cases, the viruses that cause illness in animals can spread to humans. Where did the coronavirus come from? In December 2019, [place] told the [facility]) of several cases of lung disease (human respiratory illness). These cases were linked to an open seafood and livestock market in the city of [place]. The link to the seafood and livestock market suggests that the virus may have spread from animals to humans. However, since that first outbreak in December, the virus has also been shown to spread from person to person. What is the name of the disease and the virus? Disease name Early on, this disease was called novel coronavirus. This is because scientists determined that the disease was caused by a new (novel) respiratory virus. The World Health Organization [facility]) has now named the disease COVID-19, or coronavirus disease. Virus name The virus that causes the disease is called severe acute respiratory syndrome coronavirus 2 (SARS-CoV-2). More information on disease and virus naming World Health Organization [facility]): www.who.int/emergencies/diseases/novel-coronavirus-2019/technical-guidance/naming-the-coronavirus-disease-(covid-2019)-and-the-virus-that-causes-it Who is at risk for complications from coronavirus disease? Some people may be at higher risk for complications from coronavirus disease. This includes older adults and people who have chronic diseases, such as heart disease, diabetes, and lung disease. If you are at higher risk for complications, take these extra precautions:  Avoid close contact with people who are sick or have a fever or cough. Stay at least 3-6 ft (1-2 m) away from them, if  possible.  Wash your hands often with soap and water for at least 20 seconds.  Avoid touching your face, mouth, nose, or eyes.  Keep supplies on hand at home, such as food, medicine, and cleaning supplies.  Stay home as much as possible.  Avoid social gatherings and travel. How does coronavirus disease spread? The virus that causes coronavirus disease spreads easily from person to person (is contagious). There are also cases of community-spread disease. This means the disease has spread to:  People who have no known contact with other infected people.  People who have not traveled to areas where there are known cases. It appears to spread from one person to another through droplets from coughing or sneezing. Can I get the virus from touching surfaces or objects? There is still a lot that we do not know about the virus that causes coronavirus disease. Scientists are basing a lot of information on what they know about similar viruses, such as:  Viruses cannot generally survive on surfaces for long. They need a human body (host) to survive.  It is more likely that the virus is spread by close contact with people who are sick (direct contact), such as through: ? Shaking hands or hugging. ? Breathing in respiratory droplets that travel through the air. This can happen when an infected person coughs or sneezes on or near other people.  It is less likely that the virus is spread when a person touches a surface or object that has the virus on it (indirect contact). The virus may be able to enter the body if the person touches a surface or object and then touches his or her face, eyes, nose, or mouth. Can a person spread the virus without having symptoms of the disease? It may be possible  for the virus to spread before a person has symptoms of the disease, but this is most likely not the main way the virus is spreading. It is more likely for the virus to spread by being in close contact with  people who are sick and breathing in the respiratory droplets of a sick person's cough or sneeze. What are the symptoms of coronavirus disease? Symptoms vary from person to person and can range from mild to severe. Symptoms may include:  Fever.  Cough.  Tiredness, weakness, or fatigue.  Fast breathing or feeling short of breath. These symptoms can appear anywhere from 2 to 14 days after you have been exposed to the virus. If you develop symptoms, call your health care provider. People with severe symptoms may need hospital care. If I am exposed to the virus, how long does it take before symptoms start? Symptoms of coronavirus disease may appear anywhere from 2 to 14 days after a person has been exposed to the virus. If you develop symptoms, call your health care provider. Should I be tested for this virus? Your health care provider will decide whether to test you based on your symptoms, history of exposure, and your risk factors. How does a health care provider test for this virus? Health care providers will collect samples to send for testing. Samples may include:  Taking a swab of fluid from the nose.  Taking fluid from the lungs by having you cough up mucus (sputum) into a sterile cup.  Taking a blood sample.  Taking a stool or urine sample. Is there a treatment or vaccine for this virus? Currently, there is no vaccine to prevent coronavirus disease. Also, there are no medicines like antibiotics or antivirals to treat the virus. A person who becomes sick is given supportive care, which means rest and fluids. A person may also relieve his or her symptoms by using over-the-counter medicines that treat sneezing, coughing, and runny nose. These are the same medicines that a person takes for the common cold. If you develop symptoms, call your health care provider. People with severe symptoms may need hospital care. What can I do to protect myself and my family from this virus?     You  can protect yourself and your family by taking the same actions that you would take to prevent the spread of other viruses. Take the following actions:  Wash your hands often with soap and water for at least 20 seconds. If soap and water are not available, use alcohol-based hand sanitizer.  Avoid touching your face, mouth, nose, or eyes.  Cough or sneeze into a tissue, sleeve, or elbow. Do not cough or sneeze into your hand or the air. ? If you cough or sneeze into a tissue, throw it away immediately and wash your hands.  Disinfect objects and surfaces that you frequently touch every day.  Avoid close contact with people who are sick or have a fever or cough. Stay at least 3-6 ft (1-2 m) away from them, if possible.  Stay home if you are sick, except to get medical care. Call your health care provider before you get medical care.  Make sure your vaccines are up to date. Ask your health care provider what vaccines you need. What should I do if I need to travel? Follow travel recommendations from your local health authority, the CDC, and WHO. Travel information and advice  Centers for Disease Control and Prevention (CDC): BodyEditor.hu  World Health Organization [facility]): ThirdIncome.ca Know the  risks and take action to protect your health  You are at higher risk of getting coronavirus disease if you are traveling to areas with an outbreak or if you are exposed to travelers from areas with an outbreak.  Wash your hands often and practice good hygiene to lower the risk of catching or spreading the virus. What should I do if I am sick? General instructions to stop the spread of infection  Wash your hands often with soap and water for at least 20 seconds. If soap and water are not available, use alcohol-based hand sanitizer.  Cough or sneeze into a tissue, sleeve, or elbow. Do not cough or sneeze  into your hand or the air.  If you cough or sneeze into a tissue, throw it away immediately and wash your hands.  Stay home unless you must get medical care. Call your health care provider or local health authority before you get medical care.  Avoid public areas. Do not take public transportation, if possible.  If you can, wear a mask if you must go out of the house or if you are in close contact with someone who is not sick. Keep your home clean  Disinfect objects and surfaces that are frequently touched every day. This may include: ? Counters and tables. ? Doorknobs and light switches. ? Sinks and faucets. ? Electronics such as phones, remote controls, keyboards, computers, and tablets.  Wash dishes in hot, soapy water or use a dishwasher. Air-dry your dishes.  Wash laundry in hot water. Prevent infecting other household members  Let healthy household members care for children and pets, if possible. If you have to care for children or pets, wash your hands often and wear a mask.  Sleep in a different bedroom or bed, if possible.  Do not share personal items, such as razors, toothbrushes, deodorant, combs, brushes, towels, and washcloths. Where to find more information Centers for Disease Control and Prevention (CDC)  Information and news updates: https://www.butler-gonzalez.com/ World Health Organization [facility])  Information and news updates: MissExecutive.com.ee  Coronavirus health topic: https://www.castaneda.info/  Questions and answers on COVID-19: OpportunityDebt.at  Global tracker: who.sprinklr.com American Academy of Pediatrics (AAP)  Information for families: www.healthychildren.org/English/health-issues/conditions/chest-lungs/Pages/2019-Novel-Coronavirus.aspx The coronavirus situation is changing rapidly. Check your local health authority website or the CDC and [facility] websites for updates  and news. When should I contact a health care provider?  Contact your health care provider if you have symptoms of an infection, such as fever or cough, and you: ? Have been near anyone who is known to have coronavirus disease. ? Have come into contact with a person who is suspected to have coronavirus disease. ? Have traveled outside of the country. When should I get emergency medical care?  Get help right away by calling your local emergency services (911 in the [place] if you have: ? Trouble breathing. ? Pain or pressure in your chest. ? Confusion. ? Blue-tinged lips and fingernails. ? Difficulty waking from sleep. ? Symptoms that get worse. Let the emergency medical personnel know if you think you have coronavirus disease. Summary  A new respiratory virus is spreading from person to person and causing COVID-19 (coronavirus disease).  The virus that causes COVID-19 appears to spread easily. It spreads from one person to another through droplets from coughing or sneezing.  Older adults and those with chronic diseases are at higher risk of disease. If you are at higher risk for complications, take extra precautions.  There is currently no vaccine to prevent coronavirus  disease. There are no medicines, such as antibiotics or antivirals, to treat the virus.  You can protect yourself and your family by washing your hands often, avoiding touching your face, and covering your coughs and sneezes. This information is not intended to replace advice given to you by your health care provider. Make sure you discuss any questions you have with your health care provider. Document Released: 12/04/2018 Document Revised: 12/04/2018 Document Reviewed: 12/04/2018 Elsevier Patient Education  [place].

## 2019-07-25 NOTE — Progress Notes (Signed)
Virtual Visit via Video   This visit type was conducted due to national recommendations for restrictions regarding the COVID-19 Pandemic (e.g. social distancing) in an effort to limit this patient's exposure and mitigate transmission in our community.  Due to her co-morbid illnesses, this patient is at least at moderate risk for complications without adequate follow up.  This format is felt to be most appropriate for this patient at this time.  All issues noted in this document were discussed and addressed.  A limited physical exam was performed with this format.    This visit type was conducted due to national recommendations for restrictions regarding the COVID-19 Pandemic (e.g. social distancing) in an effort to limit this patient's exposure and mitigate transmission in our community.  Patients identity confirmed using two different identifiers.  This format is felt to be most appropriate for this patient at this time.  All issues noted in this document were discussed and addressed.  No physical exam was performed (except for noted visual exam findings with Video Visits).    Date:  07/25/2019   ID:  Wendy Parks, DOB 12-Apr-1981, MRN [id number]  Patient Location:  Home  Provider location:   Office     Chief Complaint:  "I have COVID"  History of Present Illness:    Wendy Parks is a 38 y.o. female who presents via video conferencing for a telehealth visit today.    The patient does have symptoms concerning for COVID-19 infection (fever, chills, cough, or new shortness of breath).   She presents today for virtual visit. She prefers this method of contact due to COVID-19 pandemic. She reports that she has tested positive for COVID-19. She reports having Thanksgiving dinner with family. The next day her cousin was diagnosed with COVID. She developed chest congestion, sinus congestion and headache on Friday. She works from home and lives with her husband.     Past Medical History:   Diagnosis Date  . Kidney stone    Past Surgical History:  Procedure Laterality Date  . KIDNEY STONE SURGERY    . TUBAL LIGATION  2012     No outpatient medications have been marked as taking for the 07/25/19 encounter (Telemedicine) with Glendale Chard, MD.     Allergies:   Patient has no known allergies.   Social History   Tobacco Use  . Smoking status: Never Smoker  . Smokeless tobacco: Never Used  Substance Use Topics  . Alcohol use: Yes    Comment: occasionally  . Drug use: Never     Family Hx: The patient's family history includes Cancer in her father; Dementia in her father; Healthy in her mother.  ROS:   Please see the history of present illness.    Review of Systems  Constitutional: Negative.   HENT: Positive for congestion and sinus pain.   Respiratory: Negative.   Cardiovascular: Negative.   Gastrointestinal: Negative.   Neurological: Negative.   Psychiatric/Behavioral: Negative.     All other systems reviewed and are negative.   Labs/Other Tests and Data Reviewed:    Recent Labs: 06/20/2019: ALT 9; BUN 17; Creatinine, Ser 0.96; Hemoglobin 13.1; Platelets 302; Potassium 4.1; Sodium 140   Recent Lipid Panel Lab Results  Component Value Date/Time   CHOL 181 06/20/2019 12:00 AM   TRIG 74 06/20/2019 12:00 AM   HDL 65 06/20/2019 12:00 AM   LDLCALC 102 (H) 06/20/2019 12:00 AM    Wt Readings from Last 3 Encounters:  06/24/19 192 lb 3.2 oz (87.2  kg)  05/04/18 176 lb (79.8 kg)     Exam:    Vital Signs:  Temp 99.1 F (37.3 C) Comment: pt provided    Physical Exam  Constitutional: She is oriented to person, place, and time and well-developed, well-nourished, and in no distress.  HENT:  Head: Normocephalic and atraumatic.  Neck: Normal range of motion.  Pulmonary/Chest: Effort normal.  Neurological: She is alert and oriented to person, place, and time.  Psychiatric: Affect normal.  Nursing note and vitals reviewed.   ASSESSMENT & PLAN:      1. COVID-19  She was given rx azithromycin, encouraged to take full abx course. She agrees to Mychart temperature monitoring. She is encouraged to go to ER if she develops worsening shortness of breath.  She is encouraged to quarantine from the rest of her family to keep virus from spreading within her household.  She agrees to touch base with me daily to let me know how she is doing. She was given a return to work note for 12/10. She will contact me at that time if she is still having symptoms.   - Temperature monitoring; Future  2. Cough  She will continue with Robitussin prn. She is encouraged to avoid dairy products as well.     COVID-19 Education: The signs and symptoms of COVID-19 were discussed with the patient and how to seek care for testing (follow up with PCP or arrange E-visit).  The importance of social distancing was discussed today.  Patient Risk:   After full review of this patients clinical status, I feel that they are at least moderate risk at this time.     Medication Adjustments/Labs and Tests Ordered: Current medicines are reviewed at length with the patient today.  Concerns regarding medicines are outlined above.   Tests Ordered: No orders of the defined types were placed in this encounter.   Medication Changes: Meds ordered this encounter  Medications  . azithromycin (ZITHROMAX) 250 MG tablet    Sig: Take 2 tablets (500 mg) on  Day 1,  followed by 1 tablet (250 mg) once daily on Days 2 through 5.    Dispense:  6 each    Refill:  0    Disposition:  Follow up prn  Signed, Maximino Greenland, MD

## 2019-07-26 ENCOUNTER — Encounter (INDEPENDENT_AMBULATORY_CARE_PROVIDER_SITE_OTHER): Payer: Self-pay

## 2019-07-26 ENCOUNTER — Telehealth: Payer: Self-pay | Admitting: *Deleted

## 2019-07-26 NOTE — Telephone Encounter (Signed)
Pt called due to BPA for worsening appetite. pt states worsening appetite has occurred since the start of symptoms. Pt states that she has been able to eat soup. Pt states that she had some nausea but not no vomiting. Pt encouraged to eat and drink fluids as tolerated. Pt advised to try food such as crackers, bread or boiled starches. Pt advised to notify PCP if unable to tolerate any foods or liquids and/or if vomiting occurs. Pt verbalized understanding. No other concerns or questions voiced at this time.

## 2019-07-27 ENCOUNTER — Encounter (INDEPENDENT_AMBULATORY_CARE_PROVIDER_SITE_OTHER): Payer: Self-pay

## 2019-07-28 ENCOUNTER — Encounter (INDEPENDENT_AMBULATORY_CARE_PROVIDER_SITE_OTHER): Payer: Self-pay

## 2019-07-28 ENCOUNTER — Telehealth: Payer: Self-pay

## 2019-07-28 ENCOUNTER — Encounter: Payer: Self-pay | Admitting: Internal Medicine

## 2019-07-28 ENCOUNTER — Other Ambulatory Visit: Payer: Self-pay | Admitting: Internal Medicine

## 2019-07-28 MED ORDER — ALBUTEROL SULFATE HFA 108 (90 BASE) MCG/ACT IN AERS: 2.0000 | INHALATION_SPRAY | Freq: Four times a day (QID) | RESPIRATORY_TRACT | 1 refills | Status: DC | PRN

## 2019-07-28 NOTE — Telephone Encounter (Signed)
Pt had 1 episode of vomiting last night. No more since. Denies nausea. Encouraged pt to drink fluids as tolerated and to slowly work up to a bland diet such as crackers, applesauce, toast, pretzels and boiled statrches. Advised pt that if she is unable to tolerate any food or fluids to call her PCP. Advised pt that if vomiting become severe (>6 times per day and/or greater than 8 hours) and/or having severe abdominal pain to call 911 and seek treatment in the ED. Pt verbalized understanding.

## 2019-07-28 NOTE — Telephone Encounter (Signed)
This encounter was created in error - please disregard.

## 2019-07-29 ENCOUNTER — Encounter (INDEPENDENT_AMBULATORY_CARE_PROVIDER_SITE_OTHER): Payer: Self-pay

## 2019-07-30 ENCOUNTER — Telehealth: Payer: Self-pay

## 2019-07-30 ENCOUNTER — Encounter (INDEPENDENT_AMBULATORY_CARE_PROVIDER_SITE_OTHER): Payer: Self-pay

## 2019-07-30 ENCOUNTER — Encounter: Payer: Self-pay | Admitting: Internal Medicine

## 2019-07-30 NOTE — Telephone Encounter (Signed)
Patient advise on diarrhea per protocol:   If diarrhea remains the same: encourage patient to drink oral fluids and bland foods.   Avoid alcohol, spicy foods, caffeine or fatty foods that could make diarrhea worse.   Continue to monitor for signs of dehydration (increased thirst decreased urine output, yellow urine, dry skin, headache or dizziness).   Advise patient to try OTC medication (Imodium, kaopectate, Pepto-Bismol) as per manufacturer's instructions.    If worsening diarrhea occurs and becomes severe (6-7 bowel movements a day): notify PCP   If diarrhea last greater than 7 days: notify PCP   IF SIGNS OF DEHYDRATION OCCUR (INCREASED THIRST, DECREASED URINE OUTPUT, YELLOW URINE, DRY SKIN, HEADACHE OR DIZZINESS) ADVISE PATIENT TO CALL 911 AND SEEK TREATMENT IN THE ED  Patient states that she has 3 bowel movements yesterday. Patient will continue to monitor and verbalized understanding

## 2019-07-31 ENCOUNTER — Encounter (INDEPENDENT_AMBULATORY_CARE_PROVIDER_SITE_OTHER): Payer: Self-pay

## 2019-08-01 ENCOUNTER — Encounter (INDEPENDENT_AMBULATORY_CARE_PROVIDER_SITE_OTHER): Payer: Self-pay

## 2019-08-02 ENCOUNTER — Encounter (INDEPENDENT_AMBULATORY_CARE_PROVIDER_SITE_OTHER): Payer: Self-pay

## 2019-08-03 ENCOUNTER — Encounter: Payer: Self-pay | Admitting: Internal Medicine

## 2019-08-03 ENCOUNTER — Encounter (INDEPENDENT_AMBULATORY_CARE_PROVIDER_SITE_OTHER): Payer: Self-pay

## 2019-08-04 ENCOUNTER — Encounter (INDEPENDENT_AMBULATORY_CARE_PROVIDER_SITE_OTHER): Payer: Self-pay

## 2019-08-05 ENCOUNTER — Encounter: Payer: Self-pay | Admitting: Internal Medicine

## 2019-08-05 ENCOUNTER — Encounter (INDEPENDENT_AMBULATORY_CARE_PROVIDER_SITE_OTHER): Payer: Self-pay

## 2019-08-05 ENCOUNTER — Other Ambulatory Visit: Payer: Self-pay | Admitting: Internal Medicine

## 2019-08-05 DIAGNOSIS — U071 COVID-19: Secondary | ICD-10-CM

## 2019-08-06 ENCOUNTER — Encounter (INDEPENDENT_AMBULATORY_CARE_PROVIDER_SITE_OTHER): Payer: Self-pay

## 2019-09-27 DIAGNOSIS — G43719 Chronic migraine without aura, intractable, without status migrainosus: Secondary | ICD-10-CM | POA: Diagnosis not present

## 2019-09-27 DIAGNOSIS — M791 Myalgia, unspecified site: Secondary | ICD-10-CM | POA: Diagnosis not present

## 2019-09-27 DIAGNOSIS — G501 Atypical facial pain: Secondary | ICD-10-CM | POA: Diagnosis not present

## 2019-09-27 DIAGNOSIS — M542 Cervicalgia: Secondary | ICD-10-CM | POA: Diagnosis not present

## 2019-09-27 DIAGNOSIS — G518 Other disorders of facial nerve: Secondary | ICD-10-CM | POA: Diagnosis not present

## 2019-12-27 DIAGNOSIS — M542 Cervicalgia: Secondary | ICD-10-CM | POA: Diagnosis not present

## 2019-12-27 DIAGNOSIS — G518 Other disorders of facial nerve: Secondary | ICD-10-CM | POA: Diagnosis not present

## 2019-12-27 DIAGNOSIS — G43719 Chronic migraine without aura, intractable, without status migrainosus: Secondary | ICD-10-CM | POA: Diagnosis not present

## 2019-12-27 DIAGNOSIS — M791 Myalgia, unspecified site: Secondary | ICD-10-CM | POA: Diagnosis not present

## 2020-01-02 DIAGNOSIS — E559 Vitamin D deficiency, unspecified: Secondary | ICD-10-CM | POA: Diagnosis not present

## 2020-01-02 DIAGNOSIS — N760 Acute vaginitis: Secondary | ICD-10-CM | POA: Diagnosis not present

## 2020-01-02 DIAGNOSIS — Z113 Encounter for screening for infections with a predominantly sexual mode of transmission: Secondary | ICD-10-CM | POA: Diagnosis not present

## 2020-01-02 DIAGNOSIS — R87610 Atypical squamous cells of undetermined significance on cytologic smear of cervix (ASC-US): Secondary | ICD-10-CM | POA: Diagnosis not present

## 2020-01-02 DIAGNOSIS — Z01419 Encounter for gynecological examination (general) (routine) without abnormal findings: Secondary | ICD-10-CM | POA: Diagnosis not present

## 2020-01-02 LAB — HM PAP SMEAR: HM Pap smear: POSITIVE

## 2020-04-03 DIAGNOSIS — M542 Cervicalgia: Secondary | ICD-10-CM | POA: Diagnosis not present

## 2020-04-03 DIAGNOSIS — G518 Other disorders of facial nerve: Secondary | ICD-10-CM | POA: Diagnosis not present

## 2020-04-03 DIAGNOSIS — G43719 Chronic migraine without aura, intractable, without status migrainosus: Secondary | ICD-10-CM | POA: Diagnosis not present

## 2020-04-03 DIAGNOSIS — M791 Myalgia, unspecified site: Secondary | ICD-10-CM | POA: Diagnosis not present

## 2020-07-01 ENCOUNTER — Ambulatory Visit (INDEPENDENT_AMBULATORY_CARE_PROVIDER_SITE_OTHER): Payer: BC Managed Care – PPO | Admitting: Internal Medicine

## 2020-07-01 ENCOUNTER — Encounter: Payer: Self-pay | Admitting: Internal Medicine

## 2020-07-01 ENCOUNTER — Other Ambulatory Visit: Payer: Self-pay

## 2020-07-01 VITALS — BP 114/72 | HR 69 | Temp 98.4°F | Ht 67.0 in | Wt 207.6 lb

## 2020-07-01 DIAGNOSIS — Z0000 Encounter for general adult medical examination without abnormal findings: Secondary | ICD-10-CM | POA: Diagnosis not present

## 2020-07-01 DIAGNOSIS — Z6832 Body mass index (BMI) 32.0-32.9, adult: Secondary | ICD-10-CM | POA: Diagnosis not present

## 2020-07-01 DIAGNOSIS — M21619 Bunion of unspecified foot: Secondary | ICD-10-CM | POA: Diagnosis not present

## 2020-07-01 DIAGNOSIS — E6609 Other obesity due to excess calories: Secondary | ICD-10-CM | POA: Diagnosis not present

## 2020-07-01 DIAGNOSIS — Z79899 Other long term (current) drug therapy: Secondary | ICD-10-CM | POA: Diagnosis not present

## 2020-07-01 DIAGNOSIS — R7309 Other abnormal glucose: Secondary | ICD-10-CM | POA: Diagnosis not present

## 2020-07-01 MED ORDER — MONTELUKAST SODIUM 10 MG PO TABS: 10.0000 mg | ORAL_TABLET | Freq: Every day | ORAL | 2 refills | Status: DC

## 2020-07-01 NOTE — Progress Notes (Signed)
I,Katawbba Wiggins,acting as a Education administrator for Maximino Greenland, MD.,have documented all relevant documentation on the behalf of Maximino Greenland, MD,as directed by  Maximino Greenland, MD while in the presence of Maximino Greenland, MD.  This visit occurred during the SARS-CoV-2 public health emergency.  Safety protocols were in place, including screening questions prior to the visit, additional usage of staff PPE, and extensive cleaning of exam room while observing appropriate contact time as indicated for disinfecting solutions.  Subjective:     Patient ID: Wendy Parks , female    DOB: [birth year] , 39 y.o.   MRN: [id number]   Chief Complaint  Patient presents with  . Annual Exam    HPI  She is here today for a full physical examination.  She is followed by Dr. Mancel Bale for her Gyn care. She reports having pap smear performed May 2021 and had an endometrial ablation in June 2020.  The patient would like to discuss a possible bunion on her right foot. She does not wish to get the flu shot.     Past Medical History:  Diagnosis Date  . Kidney stone      Family History  Problem Relation Age of Onset  . Healthy Mother   . Dementia Father   . Cancer Father      Current Outpatient Medications:  .  albuterol (VENTOLIN HFA) 108 (90 Base) MCG/ACT inhaler, Inhale 2 puffs into the lungs every 6 (six) hours as needed for wheezing or shortness of breath., Disp: 18 g, Rfl: 1 .  baclofen (LIORESAL) 10 MG tablet, Take 10 mg by mouth 2 (two) times daily as needed., Disp: , Rfl:  .  montelukast (SINGULAIR) 10 MG tablet, Take 1 tablet (10 mg total) by mouth daily., Disp: 30 tablet, Rfl: 2 .  topiramate ER (QUDEXY XR) 200 MG CS24 sprinkle capsule, Qudexy XR 200 mg capsule sprinkle,extended release  TAKE 1 BY MOUTH ONCE DAILY, Disp: , Rfl:    No Known Allergies    The patient states she uses none for birth control. Last LMP was No LMP recorded. Patient has had an ablation.. Negative for Dysmenorrhea.  Negative for: breast discharge, breast lump(s), breast pain and breast self exam. Associated symptoms include abnormal vaginal bleeding. Pertinent negatives include abnormal bleeding (hematology), anxiety, decreased libido, depression, difficulty falling sleep, dyspareunia, history of infertility, nocturia, sexual dysfunction, sleep disturbances, urinary incontinence, urinary urgency, vaginal discharge and vaginal itching. Diet regular.The patient states her exercise level is  intermittent.  . The patient's tobacco use is:  Social History   Tobacco Use  Smoking Status Never Smoker  Smokeless Tobacco Never Used  . She has been exposed to passive smoke. The patient's alcohol use is:  Social History   Substance and Sexual Activity  Alcohol Use Yes   Comment: occasionally      Review of Systems  Constitutional: Negative.   HENT: Negative.   Eyes: Negative.   Respiratory: Negative.   Cardiovascular: Negative.   Gastrointestinal: Negative.   Endocrine: Negative.   Genitourinary: Negative.   Musculoskeletal: Positive for arthralgias.       She c/o right foot pain. She has a bunion on this foot. She reports her sx started back in May. Has dull, throbbing pain at night.   Skin: Negative.   Allergic/Immunologic: Negative.   Neurological: Negative.   Hematological: Negative.   Psychiatric/Behavioral: Negative.      Today's Vitals   07/01/20 0945  BP: 114/72  Pulse: 69  Temp: 98.4  F (36.9 C)  TempSrc: Oral  Weight: 207 lb 9.6 oz (94.2 kg)  Height: 5' 7"  (1.702 m)   Body mass index is 32.51 kg/m.  Wt Readings from Last 3 Encounters:  07/01/20 207 lb 9.6 oz (94.2 kg)  06/24/19 192 lb 3.2 oz (87.2 kg)  05/04/18 176 lb (79.8 kg)   Objective:  Physical Exam Constitutional:      General: She is not in acute distress.    Appearance: Normal appearance. She is well-developed. She is obese.  HENT:     Head: Normocephalic and atraumatic.     Right Ear: Hearing, tympanic membrane,  ear canal and external ear normal. There is no impacted cerumen.     Left Ear: Hearing, tympanic membrane, ear canal and external ear normal. There is no impacted cerumen.     Nose:     Comments: Deferred, masked    Mouth/Throat:     Comments: Deferred, masked Eyes:     General: Lids are normal.     Extraocular Movements: Extraocular movements intact.     Conjunctiva/sclera: Conjunctivae normal.     Pupils: Pupils are equal, round, and reactive to light.     Funduscopic exam:    Right eye: No papilledema.        Left eye: No papilledema.  Neck:     Thyroid: No thyroid mass.     Vascular: No carotid bruit.  Cardiovascular:     Rate and Rhythm: Normal rate and regular rhythm.     Pulses: Normal pulses.     Heart sounds: Normal heart sounds. No murmur heard.   Pulmonary:     Effort: Pulmonary effort is normal.     Breath sounds: Normal breath sounds.  Chest:     Breasts: Tanner Score is 5.        Right: Normal.        Left: Normal.  Abdominal:     General: Abdomen is flat. Bowel sounds are normal. There is no distension.     Palpations: Abdomen is soft.     Tenderness: There is no abdominal tenderness.  Musculoskeletal:        General: No swelling. Normal range of motion.     Cervical back: Full passive range of motion without pain, normal range of motion and neck supple.     Right lower leg: No edema.     Left lower leg: No edema.     Right foot: Bunion present.  Skin:    General: Skin is warm and dry.     Capillary Refill: Capillary refill takes less than 2 seconds.  Neurological:     General: No focal deficit present.     Mental Status: She is alert and oriented to person, place, and time.     Cranial Nerves: No cranial nerve deficit.     Sensory: No sensory deficit.  Psychiatric:        Mood and Affect: Mood normal.        Behavior: Behavior normal.        Thought Content: Thought content normal.        Judgment: Judgment normal.         Assessment And Plan:      1. Routine general medical examination at health care facility Comments: A full exam was performed. Importance of monthy self breast exams was discussed with the patient. PATIENT IS ADVISED TO GET 30-45 MINUTES REGULAR EXERCISE NO LESS THAN FOUR TO FIVE DAYS PER WEEK - BOTH  WEIGHTBEARING EXERCISES AND AEROBIC ARE RECOMMENDED.  PATIENT IS ADVISED TO FOLLOW A HEALTHY DIET WITH AT LEAST SIX FRUITS/VEGGIES PER DAY, DECREASE INTAKE OF RED MEAT, AND TO INCREASE FISH INTAKE TO TWO DAYS PER WEEK.  MEATS/FISH SHOULD NOT BE FRIED, BAKED OR BROILED IS PREFERABLE.  I SUGGEST WEARING SPF 50 SUNSCREEN ON EXPOSED PARTS AND ESPECIALLY WHEN IN THE DIRECT SUNLIGHT FOR AN EXTENDED PERIOD OF TIME.  PLEASE AVOID FAST FOOD RESTAURANTS AND INCREASE YOUR WATER INTAKE.  - Hemoglobin A1c - Hepatitis C antibody - CBC - CMP14+EGFR - Lipid panel - TSH - HIV antibody (with reflex)  2. Bunion of great toe Comments: I will refer her to Pofiatry for further evaluation.  She was advised to avoid pointy-toed shoes. - Ambulatory referral to Podiatry  3. Class 1 obesity due to excess calories without serious comorbidity with body mass index (BMI) of 32.0 to 32.9 in adult Comments: She is aware of 15 pound weight loss in the past year. She is encouraged to resume regular exercise regimen and to remove processed foods from her diet.      Patient was given opportunity to ask questions. Patient verbalized understanding of the plan and was able to repeat key elements of the plan. All questions were answered to their satisfaction.   Maximino Greenland, MD   I, Maximino Greenland, MD, have reviewed all documentation for this visit. The documentation on 07/02/20 for the exam, diagnosis, procedures, and orders are all accurate and complete.  THE PATIENT IS ENCOURAGED TO PRACTICE SOCIAL DISTANCING DUE TO THE COVID-19 PANDEMIC.

## 2020-07-01 NOTE — Patient Instructions (Signed)
Health Maintenance, Female Adopting a healthy lifestyle and getting preventive care are important in promoting health and wellness. Ask your health care provider about:  The right schedule for you to have regular tests and exams.  Things you can do on your own to prevent diseases and keep yourself healthy. What should I know about diet, weight, and exercise? Eat a healthy diet   Eat a diet that includes plenty of vegetables, fruits, low-fat dairy products, and lean protein.  Do not eat a lot of foods that are high in solid fats, added sugars, or sodium. Maintain a healthy weight Body mass index (BMI) is used to identify weight problems. It estimates body fat based on height and weight. Your health care provider can help determine your BMI and help you achieve or maintain a healthy weight. Get regular exercise Get regular exercise. This is one of the most important things you can do for your health. Most adults should:  Exercise for at least 150 minutes each week. The exercise should increase your heart rate and make you sweat (moderate-intensity exercise).  Do strengthening exercises at least twice a week. This is in addition to the moderate-intensity exercise.  Spend less time sitting. Even light physical activity can be beneficial. Watch cholesterol and blood lipids Have your blood tested for lipids and cholesterol at 39 years of age, then have this test every 5 years. Have your cholesterol levels checked more often if:  Your lipid or cholesterol levels are high.  You are older than 40 years of age.  You are at high risk for heart disease. What should I know about cancer screening? Depending on your health history and family history, you may need to have cancer screening at various ages. This may include screening for:  Breast cancer.  Cervical cancer.  Colorectal cancer.  Skin cancer.  Lung cancer. What should I know about heart disease, diabetes, and high blood  pressure? Blood pressure and heart disease  High blood pressure causes heart disease and increases the risk of stroke. This is more likely to develop in people who have high blood pressure readings, are of African descent, or are overweight.  Have your blood pressure checked: ? Every 3-5 years if you are 18-39 years of age. ? Every year if you are 40 years old or older. Diabetes Have regular diabetes screenings. This checks your fasting blood sugar level. Have the screening done:  Once every three years after age 40 if you are at a normal weight and have a low risk for diabetes.  More often and at a younger age if you are overweight or have a high risk for diabetes. What should I know about preventing infection? Hepatitis B If you have a higher risk for hepatitis B, you should be screened for this virus. Talk with your health care provider to find out if you are at risk for hepatitis B infection. Hepatitis C Testing is recommended for:  Everyone born from 1945 through 1965.  Anyone with known risk factors for hepatitis C. Sexually transmitted infections (STIs)  Get screened for STIs, including gonorrhea and chlamydia, if: ? You are sexually active and are younger than 39 years of age. ? You are older than 39 years of age and your health care provider tells you that you are at risk for this type of infection. ? Your sexual activity has changed since you were last screened, and you are at increased risk for chlamydia or gonorrhea. Ask your health care provider if   you are at risk.  Ask your health care provider about whether you are at high risk for HIV. Your health care provider may recommend a prescription medicine to help prevent HIV infection. If you choose to take medicine to prevent HIV, you should first get tested for HIV. You should then be tested every 3 months for as long as you are taking the medicine. Pregnancy  If you are about to stop having your period (premenopausal) and  you may become pregnant, seek counseling before you get pregnant.  Take 400 to 800 micrograms (mcg) of folic acid every day if you become pregnant.  Ask for birth control (contraception) if you want to prevent pregnancy. Osteoporosis and menopause Osteoporosis is a disease in which the bones lose minerals and strength with aging. This can result in bone fractures. If you are 65 years old or older, or if you are at risk for osteoporosis and fractures, ask your health care provider if you should:  Be screened for bone loss.  Take a calcium or vitamin D supplement to lower your risk of fractures.  Be given hormone replacement therapy (HRT) to treat symptoms of menopause. Follow these instructions at home: Lifestyle  Do not use any products that contain nicotine or tobacco, such as cigarettes, e-cigarettes, and chewing tobacco. If you need help quitting, ask your health care provider.  Do not use street drugs.  Do not share needles.  Ask your health care provider for help if you need support or information about quitting drugs. Alcohol use  Do not drink alcohol if: ? Your health care provider tells you not to drink. ? You are pregnant, may be pregnant, or are planning to become pregnant.  If you drink alcohol: ? Limit how much you use to 0-1 drink a day. ? Limit intake if you are breastfeeding.  Be aware of how much alcohol is in your drink. In the [place] one drink equals one 12 oz bottle of beer (355 mL), one 5 oz glass of wine (148 mL), or one 1 oz glass of hard liquor (44 mL). General instructions  Schedule regular health, dental, and eye exams.  Stay current with your vaccines.  Tell your health care provider if: ? You often feel depressed. ? You have ever been abused or do not feel safe at home. Summary  Adopting a healthy lifestyle and getting preventive care are important in promoting health and wellness.  Follow your health care provider's instructions about healthy  diet, exercising, and getting tested or screened for diseases.  Follow your health care provider's instructions on monitoring your cholesterol and blood pressure. This information is not intended to replace advice given to you by your health care provider. Make sure you discuss any questions you have with your health care provider. Document Revised: 08/01/2018 Document Reviewed: 08/01/2018 Elsevier Patient Education  2020 Elsevier Inc.  

## 2020-07-02 LAB — CMP14+EGFR
ALT: 10 IU/L (ref 0–32)
AST: 15 IU/L (ref 0–40)
Albumin/Globulin Ratio: 1.5 (ref 1.2–2.2)
Albumin: 4.2 g/dL (ref 3.8–4.8)
Alkaline Phosphatase: 89 IU/L (ref 44–121)
BUN/Creatinine Ratio: 13 (ref 9–23)
BUN: 11 mg/dL (ref 6–20)
Bilirubin Total: 0.5 mg/dL (ref 0.0–1.2)
CO2: 19 mmol/L — ABNORMAL LOW (ref 20–29)
Calcium: 9 mg/dL (ref 8.7–10.2)
Chloride: 105 mmol/L (ref 96–106)
Creatinine, Ser: 0.84 mg/dL (ref 0.57–1.00)
GFR calc Af Amer: 101 mL/min/{1.73_m2} (ref >59)
GFR calc non Af Amer: 88 mL/min/{1.73_m2} (ref >59)
Globulin, Total: 2.8 g/dL (ref 1.5–4.5)
Glucose: 93 mg/dL (ref 65–99)
Potassium: 4.2 mmol/L (ref 3.5–5.2)
Sodium: 137 mmol/L (ref 134–144)
Total Protein: 7 g/dL (ref 6.0–8.5)

## 2020-07-02 LAB — CBC
Hematocrit: 37.9 % (ref 34.0–46.6)
Hemoglobin: 13 g/dL (ref 11.1–15.9)
MCH: 30.9 pg (ref 26.6–33.0)
MCHC: 34.3 g/dL (ref 31.5–35.7)
MCV: 90 fL (ref 79–97)
Platelets: 288 10*3/uL (ref 150–450)
RBC: 4.21 x10E6/uL (ref 3.77–5.28)
RDW: 11.8 % (ref 11.7–15.4)
WBC: 11.8 10*3/uL — ABNORMAL HIGH (ref 3.4–10.8)

## 2020-07-02 LAB — HEPATITIS C ANTIBODY: Hep C Virus Ab: <0.1 s/co ratio (ref 0.0–0.9)

## 2020-07-02 LAB — LIPID PANEL
Chol/HDL Ratio: 2.7 ratio (ref 0.0–4.4)
Cholesterol, Total: 172 mg/dL (ref 100–199)
HDL: 63 mg/dL (ref >39)
LDL Chol Calc (NIH): 89 mg/dL (ref 0–99)
Triglycerides: 114 mg/dL (ref 0–149)
VLDL Cholesterol Cal: 20 mg/dL (ref 5–40)

## 2020-07-02 LAB — HEMOGLOBIN A1C
Est. average glucose Bld gHb Est-mCnc: 123 mg/dL
Hgb A1c MFr Bld: 5.9 % — ABNORMAL HIGH (ref 4.8–5.6)

## 2020-07-02 LAB — HIV ANTIBODY (ROUTINE TESTING W REFLEX): HIV Screen 4th Generation wRfx: NONREACTIVE

## 2020-07-02 LAB — TSH: TSH: 1.43 u[IU]/mL (ref 0.450–4.500)

## 2020-07-10 DIAGNOSIS — G518 Other disorders of facial nerve: Secondary | ICD-10-CM | POA: Diagnosis not present

## 2020-07-10 DIAGNOSIS — G43719 Chronic migraine without aura, intractable, without status migrainosus: Secondary | ICD-10-CM | POA: Diagnosis not present

## 2020-07-10 DIAGNOSIS — M791 Myalgia, unspecified site: Secondary | ICD-10-CM | POA: Diagnosis not present

## 2020-07-10 DIAGNOSIS — M542 Cervicalgia: Secondary | ICD-10-CM | POA: Diagnosis not present

## 2020-07-23 ENCOUNTER — Ambulatory Visit: Payer: BC Managed Care – PPO | Admitting: Sports Medicine

## 2020-07-23 ENCOUNTER — Other Ambulatory Visit: Payer: Self-pay

## 2020-07-23 ENCOUNTER — Encounter: Payer: Self-pay | Admitting: Sports Medicine

## 2020-07-23 ENCOUNTER — Other Ambulatory Visit: Payer: Self-pay | Admitting: Sports Medicine

## 2020-07-23 ENCOUNTER — Ambulatory Visit (INDEPENDENT_AMBULATORY_CARE_PROVIDER_SITE_OTHER): Payer: BC Managed Care – PPO

## 2020-07-23 DIAGNOSIS — M778 Other enthesopathies, not elsewhere classified: Secondary | ICD-10-CM

## 2020-07-23 DIAGNOSIS — M21611 Bunion of right foot: Secondary | ICD-10-CM

## 2020-07-23 DIAGNOSIS — M779 Enthesopathy, unspecified: Secondary | ICD-10-CM | POA: Diagnosis not present

## 2020-07-23 DIAGNOSIS — M79671 Pain in right foot: Secondary | ICD-10-CM | POA: Diagnosis not present

## 2020-07-23 MED ORDER — MELOXICAM 15 MG PO TABS: 15.0000 mg | ORAL_TABLET | Freq: Every day | ORAL | 0 refills | Status: DC

## 2020-07-23 NOTE — Progress Notes (Addendum)
Subjective: Wendy Parks is a 39 y.o. female patient who presents to office for evaluation of Right> Left bunion pain. Patient complains of progressive pain especially over the several months since April reports that there is pain with pressure to the ball and with range of motion at the right great toe joint.  Patient now has difficulty fitting shoes comfortably.  Reports that she has tried changing shoes and she is at the point where she is staying in pain even with her tennis shoes patient also reports that she has tried Voltaren topical without relief. Patient denies any other pedal complaints.   Review of systems noncontributory  Patient Active Problem List   Diagnosis Date Noted  . Nephrolithiasis 06/24/2019    Current Outpatient Medications on File Prior to Visit  Medication Sig Dispense Refill  . albuterol (VENTOLIN HFA) 108 (90 Base) MCG/ACT inhaler Inhale 2 puffs into the lungs every 6 (six) hours as needed for wheezing or shortness of breath. 18 g 1  . baclofen (LIORESAL) 10 MG tablet Take 10 mg by mouth 2 (two) times daily as needed.    . montelukast (SINGULAIR) 10 MG tablet Take 1 tablet (10 mg total) by mouth daily. 30 tablet 2  . topiramate ER (QUDEXY XR) 200 MG CS24 sprinkle capsule Qudexy XR 200 mg capsule sprinkle,extended release  TAKE 1 BY MOUTH ONCE DAILY     No current facility-administered medications on file prior to visit.    No Known Allergies   Family History  Problem Relation Age of Onset  . Healthy Mother   . Dementia Father   . Cancer Father     Social History   Socioeconomic History  . Marital status: Married    Spouse name: Not on file  . Number of children: Not on file  . Years of education: Not on file  . Highest education level: Not on file  Occupational History  . Not on file  Tobacco Use  . Smoking status: Never Smoker  . Smokeless tobacco: Never Used  Vaping Use  . Vaping Use: Never used  Substance and Sexual Activity  . Alcohol  use: Yes    Comment: occasionally  . Drug use: Never  . Sexual activity: Not on file  Other Topics Concern  . Not on file  Social History Narrative  . Not on file   Social Determinants of Health   Financial Resource Strain: Not on file  Food Insecurity: Not on file  Transportation Needs: Not on file  Physical Activity: Not on file  Stress: Not on file  Social Connections: Not on file    Past Surgical History:  Procedure Laterality Date  . ENDOMETRIAL ABLATION  01/2019   Dr. Mancel Bale  . KIDNEY STONE SURGERY    . TUBAL LIGATION  2012    Objective:  General: Alert and oriented x3 in no acute distress  Dermatology: No open lesions bilateral lower extremities, no webspace macerations, no ecchymosis bilateral, all nails x 10 are well manicured.  Vascular: Dorsalis Pedis and Posterior Tibial pedal pulses 2/4, Capillary Fill Time 3 seconds, (+) pedal hair growth bilateral, no edema bilateral lower extremities, Temperature gradient within normal limits.  Neurology: Gross sensation intact via light touch bilateral.  Musculoskeletal: Mild to moderate tenderness with palpation right bunion deformity, no limitation or crepitus with range of motion, deformity reducible, tracking not trackbound, there is no 1st ray hypermobility noted bilateral. Midtarsal, Subtalar joint, and ankle joint range of motion is within normal limits. On weightbearing exam,  there is decreased 1st MTPJ rom Right>Left with functional limitus noted, there is medial arch collapse Right> Left on weightbearing, rearfoot slight valgus, forefoot slight abduction with HAV deformity supported on ground with no second toe crossover deformity noted.   Xrays  Right foot   Impression: Intermetatarsal angle above normal limits midtarsal breech supportive of pes planus deformity no other acute osseous findings noted.      Assessment and Plan: Problem List Items Addressed This Visit    None    Visit Diagnoses    Bunion, right  foot    -  Primary   Relevant Orders   DG Foot Complete Right   Right foot pain       Capsulitis           -Complete examination performed -Xrays reviewed -Discussed treatement options; discussed HAV deformity;conservative and  Surgical management; risks, benefits, alternatives discussed. All patient's questions answered. -Dispensed bunion shield for patient to use as instructed -Rx meloxicam for patient to take as instructed -Recommend continue with good supportive shoes and inserts.  -Patient to return to office after 1 month for consideration for surgery for right bunion or sooner if condition worsens.  Landis Martins, DPM

## 2020-08-13 ENCOUNTER — Other Ambulatory Visit: Payer: Self-pay | Admitting: Sports Medicine

## 2020-09-10 ENCOUNTER — Ambulatory Visit: Payer: BC Managed Care – PPO | Admitting: Sports Medicine

## 2020-09-16 ENCOUNTER — Other Ambulatory Visit: Payer: Self-pay | Admitting: Sports Medicine

## 2020-09-16 NOTE — Telephone Encounter (Signed)
Please advise 

## 2020-09-18 DIAGNOSIS — G43719 Chronic migraine without aura, intractable, without status migrainosus: Secondary | ICD-10-CM | POA: Diagnosis not present

## 2020-09-18 DIAGNOSIS — M791 Myalgia, unspecified site: Secondary | ICD-10-CM | POA: Diagnosis not present

## 2020-09-18 DIAGNOSIS — G518 Other disorders of facial nerve: Secondary | ICD-10-CM | POA: Diagnosis not present

## 2020-09-18 DIAGNOSIS — M542 Cervicalgia: Secondary | ICD-10-CM | POA: Diagnosis not present

## 2020-09-24 ENCOUNTER — Encounter: Payer: Self-pay | Admitting: Sports Medicine

## 2020-09-24 ENCOUNTER — Other Ambulatory Visit: Payer: Self-pay

## 2020-09-24 ENCOUNTER — Ambulatory Visit: Payer: BC Managed Care – PPO | Admitting: Sports Medicine

## 2020-09-24 DIAGNOSIS — M21611 Bunion of right foot: Secondary | ICD-10-CM

## 2020-09-24 DIAGNOSIS — M79671 Pain in right foot: Secondary | ICD-10-CM

## 2020-09-24 NOTE — Progress Notes (Signed)
Subjective: Wendy Parks is a 40 y.o. female patient who returns office for follow-up evaluation of bunion pain patient reports that bunion still hurts especially with direct pressure to the toe and toe joint on the right foot.  Patient reports that previous dispensing of the bunion pad and anti-inflammatories did not provide complete relief and is interested in further discussing surgery.  Patient Active Problem List   Diagnosis Date Noted  . Nephrolithiasis 06/24/2019    Current Outpatient Medications on File Prior to Visit  Medication Sig Dispense Refill  . albuterol (VENTOLIN HFA) 108 (90 Base) MCG/ACT inhaler Inhale 2 puffs into the lungs every 6 (six) hours as needed for wheezing or shortness of breath. 18 g 1  . baclofen (LIORESAL) 10 MG tablet Take 10 mg by mouth 2 (two) times daily as needed.    . meloxicam (MOBIC) 15 MG tablet TAKE 1 TABLET BY MOUTH EVERY DAY 30 tablet 0  . montelukast (SINGULAIR) 10 MG tablet Take 1 tablet (10 mg total) by mouth daily. 30 tablet 2  . topiramate ER (QUDEXY XR) 200 MG CS24 sprinkle capsule Qudexy XR 200 mg capsule sprinkle,extended release  TAKE 1 BY MOUTH ONCE DAILY     No current facility-administered medications on file prior to visit.    No Known Allergies   Family History  Problem Relation Age of Onset  . Healthy Mother   . Dementia Father   . Cancer Father     Social History   Socioeconomic History  . Marital status: Married    Spouse name: Not on file  . Number of children: Not on file  . Years of education: Not on file  . Highest education level: Not on file  Occupational History  . Not on file  Tobacco Use  . Smoking status: Never Smoker  . Smokeless tobacco: Never Used  Vaping Use  . Vaping Use: Never used  Substance and Sexual Activity  . Alcohol use: Yes    Comment: occasionally  . Drug use: Never  . Sexual activity: Not on file  Other Topics Concern  . Not on file  Social History Narrative  . Not on file    Social Determinants of Health   Financial Resource Strain: Not on file  Food Insecurity: Not on file  Transportation Needs: Not on file  Physical Activity: Not on file  Stress: Not on file  Social Connections: Not on file    Past Surgical History:  Procedure Laterality Date  . ENDOMETRIAL ABLATION  01/2019   Dr. Mancel Bale  . KIDNEY STONE SURGERY    . TUBAL LIGATION  2012    Objective:  General: Alert and oriented x3 in no acute distress  Dermatology: No open lesions bilateral lower extremities, no webspace macerations, no ecchymosis bilateral, all nails x 10 are well manicured.  Vascular: Dorsalis Pedis and Posterior Tibial pedal pulses 2/4, Capillary Fill Time 3 seconds, (+) pedal hair growth bilateral, no edema bilateral lower extremities, Temperature gradient within normal limits.  Neurology: Gross sensation intact via light touch bilateral.  Musculoskeletal: There is continued tenderness with palpation right bunion deformity, no limitation or crepitus with range of motion, deformity reducible, tracking not trackbound, there is no 1st ray hypermobility noted bilateral. Midtarsal, Subtalar joint, and ankle joint range of motion is within normal limits. On weightbearing exam, there is decreased 1st MTPJ rom Right>Left with functional limitus noted, 45 degrees dorsiflexion and 10 degrees plantarflexion on the right first MPJ, there is medial arch collapse Right> Left  on weightbearing, rearfoot slight valgus, forefoot slight abduction with HAV deformity supported on ground with no second toe crossover deformity noted.        Assessment and Plan: Problem List Items Addressed This Visit   None   Visit Diagnoses    Bunion, right foot    -  Primary   Right foot pain           -Complete examination performed -Previous Xrays reviewed -Discussed treatement options; discussed HAV deformity;conservative and  Surgical management; risks, benefits, alternatives discussed. All patient's  questions answered. --Patient ot for surgical management. Consent obtained for right bunionectomy with internal fixation. Pre and Post op course explained. Risks, benefits, alternatives explained. No guarantees given or implied. Surgical booking slip submitted and provided patient with Surgical packet and info for [place] -To dispense crutches and Cam boot at surgical center -Patient will be nonweightbearing for less than 4 weeks and is low risk for DVT no history of PE or DVT in family or personal, no current smoking, no current contraceptive use or birth control -Patient to return to office after surgery or sooner if condition worsens.  Landis Martins, DPM

## 2020-09-26 ENCOUNTER — Other Ambulatory Visit: Payer: Self-pay | Admitting: Internal Medicine

## 2020-10-11 ENCOUNTER — Other Ambulatory Visit: Payer: Self-pay | Admitting: Sports Medicine

## 2020-10-11 NOTE — Telephone Encounter (Signed)
Please advise 

## 2020-11-03 ENCOUNTER — Telehealth: Payer: Self-pay | Admitting: Urology

## 2020-11-03 NOTE — Telephone Encounter (Signed)
DOS: 11/16/20   [facility] OSTEOTOMY RIGHT --- [id number] [address] RIGHT --- [phone]   SPOKE WITH TONYA WITH [employer] SHE STATED THAT PRIOR AUTH IS NOT NEEDED FOR CPT CODES 18335 AND 82518.   REF # [id number]

## 2020-11-09 ENCOUNTER — Other Ambulatory Visit: Payer: Self-pay | Admitting: Sports Medicine

## 2020-11-09 NOTE — Telephone Encounter (Signed)
Please advise 

## 2020-11-11 DIAGNOSIS — M791 Myalgia, unspecified site: Secondary | ICD-10-CM | POA: Diagnosis not present

## 2020-11-11 DIAGNOSIS — G518 Other disorders of facial nerve: Secondary | ICD-10-CM | POA: Diagnosis not present

## 2020-11-11 DIAGNOSIS — G43719 Chronic migraine without aura, intractable, without status migrainosus: Secondary | ICD-10-CM | POA: Diagnosis not present

## 2020-11-11 DIAGNOSIS — M542 Cervicalgia: Secondary | ICD-10-CM | POA: Diagnosis not present

## 2020-11-15 ENCOUNTER — Other Ambulatory Visit: Payer: Self-pay | Admitting: Sports Medicine

## 2020-11-15 DIAGNOSIS — Z9889 Other specified postprocedural states: Secondary | ICD-10-CM

## 2020-11-15 NOTE — Progress Notes (Signed)
Post op meds entered 

## 2020-11-16 ENCOUNTER — Encounter: Payer: Self-pay | Admitting: Sports Medicine

## 2020-11-16 DIAGNOSIS — M2041 Other hammer toe(s) (acquired), right foot: Secondary | ICD-10-CM | POA: Diagnosis not present

## 2020-11-16 DIAGNOSIS — M2011 Hallux valgus (acquired), right foot: Secondary | ICD-10-CM | POA: Diagnosis not present

## 2020-11-16 DIAGNOSIS — M25571 Pain in right ankle and joints of right foot: Secondary | ICD-10-CM | POA: Diagnosis not present

## 2020-11-16 DIAGNOSIS — M205X1 Other deformities of toe(s) (acquired), right foot: Secondary | ICD-10-CM | POA: Diagnosis not present

## 2020-11-16 DIAGNOSIS — M21611 Bunion of right foot: Secondary | ICD-10-CM | POA: Diagnosis not present

## 2020-11-16 HISTORY — PX: BUNIONECTOMY: SHX129

## 2020-11-16 MED ORDER — PROMETHAZINE HCL 25 MG PO TABS: 25.0000 mg | ORAL_TABLET | Freq: Three times a day (TID) | ORAL | 0 refills | Status: DC | PRN

## 2020-11-16 MED ORDER — HYDROCODONE-ACETAMINOPHEN 10-325 MG PO TABS: 1.0000 | ORAL_TABLET | Freq: Four times a day (QID) | ORAL | 0 refills | Status: AC | PRN

## 2020-11-16 MED ORDER — DOCUSATE SODIUM 100 MG PO CAPS
100.0000 mg | ORAL_CAPSULE | Freq: Two times a day (BID) | ORAL | 0 refills | Status: DC
Start: 2020-11-16 — End: 2021-07-12

## 2020-11-16 MED ORDER — IBUPROFEN 800 MG PO TABS
800.0000 mg | ORAL_TABLET | Freq: Three times a day (TID) | ORAL | 0 refills | Status: DC | PRN
Start: 2020-11-16 — End: 2020-11-26

## 2020-11-17 ENCOUNTER — Telehealth: Payer: Self-pay | Admitting: Sports Medicine

## 2020-11-17 NOTE — Telephone Encounter (Signed)
Postoperative check phone call made to patient.  Patient reports that she is doing okay the pain has slowly started to hit her.  Patient reports that she is currently icing and has taken a pain pill to help and is getting some relief.  Patient denies nausea vomiting fever or chills.  I reminded patient to continue with rest ice elevation and protection using her cam boot and crutches when she is attempting to ambulate.  I advised patient to call office if there are any postoperative concerns or if there are issues with her dressing to her foot.  Reminded patient to leave dressing in place and to keep clean dry and intact.  Patient to return to office for post operative visit as scheduled. -Dr. Cannon Kettle

## 2020-11-26 ENCOUNTER — Encounter: Payer: Self-pay | Admitting: Sports Medicine

## 2020-11-26 ENCOUNTER — Ambulatory Visit (INDEPENDENT_AMBULATORY_CARE_PROVIDER_SITE_OTHER): Payer: BC Managed Care – PPO

## 2020-11-26 ENCOUNTER — Other Ambulatory Visit: Payer: Self-pay

## 2020-11-26 ENCOUNTER — Ambulatory Visit (INDEPENDENT_AMBULATORY_CARE_PROVIDER_SITE_OTHER): Payer: BC Managed Care – PPO | Admitting: Sports Medicine

## 2020-11-26 DIAGNOSIS — Z6836 Body mass index (BMI) 36.0-36.9, adult: Secondary | ICD-10-CM | POA: Insufficient documentation

## 2020-11-26 DIAGNOSIS — R030 Elevated blood-pressure reading, without diagnosis of hypertension: Secondary | ICD-10-CM | POA: Insufficient documentation

## 2020-11-26 DIAGNOSIS — R300 Dysuria: Secondary | ICD-10-CM | POA: Insufficient documentation

## 2020-11-26 DIAGNOSIS — Z124 Encounter for screening for malignant neoplasm of cervix: Secondary | ICD-10-CM | POA: Insufficient documentation

## 2020-11-26 DIAGNOSIS — Z6834 Body mass index (BMI) 34.0-34.9, adult: Secondary | ICD-10-CM | POA: Insufficient documentation

## 2020-11-26 DIAGNOSIS — M25559 Pain in unspecified hip: Secondary | ICD-10-CM | POA: Insufficient documentation

## 2020-11-26 DIAGNOSIS — R7309 Other abnormal glucose: Secondary | ICD-10-CM | POA: Insufficient documentation

## 2020-11-26 DIAGNOSIS — M21611 Bunion of right foot: Secondary | ICD-10-CM

## 2020-11-26 DIAGNOSIS — Z01818 Encounter for other preprocedural examination: Secondary | ICD-10-CM | POA: Insufficient documentation

## 2020-11-26 DIAGNOSIS — K219 Gastro-esophageal reflux disease without esophagitis: Secondary | ICD-10-CM

## 2020-11-26 DIAGNOSIS — Z7189 Other specified counseling: Secondary | ICD-10-CM | POA: Insufficient documentation

## 2020-11-26 DIAGNOSIS — N898 Other specified noninflammatory disorders of vagina: Secondary | ICD-10-CM | POA: Insufficient documentation

## 2020-11-26 DIAGNOSIS — E669 Obesity, unspecified: Secondary | ICD-10-CM | POA: Insufficient documentation

## 2020-11-26 DIAGNOSIS — Z9889 Other specified postprocedural states: Secondary | ICD-10-CM

## 2020-11-26 DIAGNOSIS — N912 Amenorrhea, unspecified: Secondary | ICD-10-CM | POA: Insufficient documentation

## 2020-11-26 DIAGNOSIS — L723 Sebaceous cyst: Secondary | ICD-10-CM | POA: Insufficient documentation

## 2020-11-26 DIAGNOSIS — M25449 Effusion, unspecified hand: Secondary | ICD-10-CM | POA: Insufficient documentation

## 2020-11-26 DIAGNOSIS — N644 Mastodynia: Secondary | ICD-10-CM | POA: Insufficient documentation

## 2020-11-26 DIAGNOSIS — J309 Allergic rhinitis, unspecified: Secondary | ICD-10-CM | POA: Insufficient documentation

## 2020-11-26 DIAGNOSIS — Z09 Encounter for follow-up examination after completed treatment for conditions other than malignant neoplasm: Secondary | ICD-10-CM | POA: Insufficient documentation

## 2020-11-26 DIAGNOSIS — N201 Calculus of ureter: Secondary | ICD-10-CM | POA: Insufficient documentation

## 2020-11-26 DIAGNOSIS — N915 Oligomenorrhea, unspecified: Secondary | ICD-10-CM | POA: Insufficient documentation

## 2020-11-26 DIAGNOSIS — I10 Essential (primary) hypertension: Secondary | ICD-10-CM | POA: Insufficient documentation

## 2020-11-26 DIAGNOSIS — M542 Cervicalgia: Secondary | ICD-10-CM | POA: Insufficient documentation

## 2020-11-26 DIAGNOSIS — R079 Chest pain, unspecified: Secondary | ICD-10-CM | POA: Insufficient documentation

## 2020-11-26 DIAGNOSIS — K2970 Gastritis, unspecified, without bleeding: Secondary | ICD-10-CM

## 2020-11-26 DIAGNOSIS — R109 Unspecified abdominal pain: Secondary | ICD-10-CM | POA: Insufficient documentation

## 2020-11-26 DIAGNOSIS — R102 Pelvic and perineal pain: Secondary | ICD-10-CM | POA: Insufficient documentation

## 2020-11-26 DIAGNOSIS — J329 Chronic sinusitis, unspecified: Secondary | ICD-10-CM | POA: Insufficient documentation

## 2020-11-26 DIAGNOSIS — M222X9 Patellofemoral disorders, unspecified knee: Secondary | ICD-10-CM | POA: Insufficient documentation

## 2020-11-26 HISTORY — DX: Gastritis, unspecified, without bleeding: K29.70

## 2020-11-26 HISTORY — DX: Gastro-esophageal reflux disease without esophagitis: K21.9

## 2020-11-26 MED ORDER — HYDROCODONE-ACETAMINOPHEN 10-325 MG PO TABS: 1.0000 | ORAL_TABLET | Freq: Four times a day (QID) | ORAL | 0 refills | Status: AC | PRN

## 2020-11-26 MED ORDER — IBUPROFEN 800 MG PO TABS: 800.0000 mg | ORAL_TABLET | Freq: Three times a day (TID) | ORAL | 0 refills | Status: DC | PRN

## 2020-11-26 NOTE — Progress Notes (Signed)
Subjective: Wendy Parks is a 40 y.o. female patient seen today in office for POV #1 (DOS 11/17/2019), S/P right [name] bunionectomy.  Patient admits some pain at surgical site 8 out of 10, denies calf pain, denies headache, chest pain, shortness of breath, nausea, vomiting, fever, or chills currently but did have one episode of nausea after taking pain medicine once. No other issues noted.   Patient Active Problem List   Diagnosis Date Noted  . Allergic rhinitis 11/26/2020  . Amenorrhea 11/26/2020  . Benign essential hypertension 11/26/2020  . Body mass index (BMI) 34.0-34.9, adult 11/26/2020  . Obesity, unspecified 11/26/2020  . Other specified counseling 11/26/2020  . Dysuria 11/26/2020  . Effusion of hand joint 11/26/2020  . Elevated blood-pressure reading without diagnosis of hypertension 11/26/2020  . Esophageal reflux 11/26/2020  . Gastritis 11/26/2020  . Neck pain 11/26/2020  . Oligomenorrhea 11/26/2020  . Other abnormal glucose 11/26/2020  . Pain in joint, pelvic region and thigh 11/26/2020  . Patellofemoral syndrome 11/26/2020  . Abdominal pain 11/26/2020  . Breast pain 11/26/2020  . Chest pain 11/26/2020  . Pelvic pain 11/26/2020  . Pre-operative examination 11/26/2020  . Screening for malignant neoplasm of cervix 11/26/2020  . Sebaceous cyst 11/26/2020  . Sinusitis 11/26/2020  . Surgery follow-up examination 11/26/2020  . Ureteral stone 11/26/2020  . Vaginal discharge 11/26/2020  . Nephrolithiasis 06/24/2019  . Herpes simplex 12/25/2018  . Cyst of ovary 12/14/2015  . History of hypertension 12/14/2015  . Patellofemoral syndrome of right knee 12/14/2015  . Uterine leiomyoma 12/14/2015    Current Outpatient Medications on File Prior to Visit  Medication Sig Dispense Refill  . albuterol (VENTOLIN HFA) 108 (90 Base) MCG/ACT inhaler Inhale 2 puffs into the lungs every 6 (six) hours as needed for wheezing or shortness of breath. 18 g 1  . baclofen (LIORESAL) 10  MG tablet Take 10 mg by mouth 2 (two) times daily as needed.    . docusate sodium (COLACE) 100 MG capsule Take 1 capsule (100 mg total) by mouth 2 (two) times daily. 10 capsule 0  . EMGALITY 120 MG/ML SOAJ Inject into the skin.    Eduard Roux (AIMOVIG) 140 MG/ML SOAJ Aimovig Autoinjector 140 mg/mL subcutaneous auto-injector    . meloxicam (MOBIC) 15 MG tablet TAKE 1 TABLET BY MOUTH EVERY DAY 30 tablet 0  . montelukast (SINGULAIR) 10 MG tablet TAKE 1 TABLET BY MOUTH EVERY DAY 90 tablet 1  . phentermine (ADIPEX-P) 37.5 MG tablet Take 37.5 mg by mouth daily.    . promethazine (PHENERGAN) 25 MG tablet Take 1 tablet (25 mg total) by mouth every 8 (eight) hours as needed for nausea or vomiting. 20 tablet 0  . tinidazole (TINDAMAX) 500 MG tablet tinidazole 500 mg tablet    . topiramate (TOPAMAX) 200 MG tablet Take 200 mg by mouth daily.    Marland Kitchen topiramate ER (QUDEXY XR) 200 MG CS24 sprinkle capsule Qudexy XR 200 mg capsule sprinkle,extended release  TAKE 1 BY MOUTH ONCE DAILY     No current facility-administered medications on file prior to visit.    No Known Allergies  Objective: There were no vitals filed for this visit.  General: No acute distress, AAOx3  Right foot: Sutures intact with no gapping or dehiscence at surgical site, mild swelling to right forefoot, no erythema, no warmth, no drainage, no signs of infection noted, Capillary fill time <3 seconds in all digits, gross sensation present via light touch to right foot.  Mild guarding with range  of motion at right first MPJ due to swelling and pain.  No pain with calf compression.   Post Op Xray, right foot: Excellent alignment and position. Osteotomy site healing. Hardware intact. Soft tissue swelling within normal limits for post op status.   Assessment and Plan:  Problem List Items Addressed This Visit   None   Visit Diagnoses    S/P foot surgery, right    -  Primary   Relevant Medications   ibuprofen (ADVIL) 800 MG tablet    HYDROcodone-acetaminophen (NORCO) 10-325 MG tablet   Other Relevant Orders   DG Foot Complete Right   Bunion, right foot       Relevant Orders   DG Foot Complete Right       -Patient seen and evaluated -X-rays reviewed -Applied dry sterile dressing to surgical site right foot secured with ACE wrap and stockinet  -Advised patient to make sure to keep dressings clean, dry, and intact to right surgical site, adjusting the ACE as needed  -Advised patient to continue with cam boot and crutches and nonweightbearing for 3 more weeks -Advised patient to limit activity to necessity  -Advised patient to ice and elevate as instructed -Refilled Motrin and hydrocodone for pain -Will plan for suture removal at next office visit. In the meantime, patient to call office if any issues or problems arise.   Landis Martins, DPM

## 2020-11-30 ENCOUNTER — Telehealth: Payer: Self-pay | Admitting: Sports Medicine

## 2020-11-30 NOTE — Telephone Encounter (Signed)
Patient called and stated she was seen on Thursday and Dr. Cannon Kettle sent in a medication to her pharmacy. The pharmacy has called the patient and that she needs a prior auth for this medication. Please advise

## 2020-11-30 NOTE — Telephone Encounter (Signed)
Ok it may be for her pain medication. I will submit the PA once I get the info off the fax or from her pharmacy to do so. It may take 5-7 days for her insurance to give [place] an answer if they will approve more pain medication. For the time being patient may use her motrin and may add on extra strength tylenol to help. -Dr. Chauncey Cruel

## 2020-12-03 ENCOUNTER — Ambulatory Visit (INDEPENDENT_AMBULATORY_CARE_PROVIDER_SITE_OTHER): Payer: BC Managed Care – PPO | Admitting: Sports Medicine

## 2020-12-03 ENCOUNTER — Encounter: Payer: Self-pay | Admitting: Sports Medicine

## 2020-12-03 ENCOUNTER — Other Ambulatory Visit: Payer: Self-pay

## 2020-12-03 DIAGNOSIS — Z9889 Other specified postprocedural states: Secondary | ICD-10-CM

## 2020-12-03 DIAGNOSIS — M21611 Bunion of right foot: Secondary | ICD-10-CM

## 2020-12-03 DIAGNOSIS — M79671 Pain in right foot: Secondary | ICD-10-CM

## 2020-12-03 NOTE — Progress Notes (Signed)
Subjective: Wendy Parks is a 40 y.o. female patient seen today in office for POV #2 (DOS 11/17/2019), S/P right [name] bunionectomy.  Patient admits some pain at surgical site worse at night throbbing in nature, denies calf pain, denies headache, chest pain, shortness of breath, nausea, vomiting, fever, or chills. Reports that she is having a little pain on her left ankle from using crutches No other issues noted.   Patient Active Problem List   Diagnosis Date Noted  . Allergic rhinitis 11/26/2020  . Amenorrhea 11/26/2020  . Benign essential hypertension 11/26/2020  . Body mass index (BMI) 34.0-34.9, adult 11/26/2020  . Obesity, unspecified 11/26/2020  . Other specified counseling 11/26/2020  . Dysuria 11/26/2020  . Effusion of hand joint 11/26/2020  . Elevated blood-pressure reading without diagnosis of hypertension 11/26/2020  . Esophageal reflux 11/26/2020  . Gastritis 11/26/2020  . Neck pain 11/26/2020  . Oligomenorrhea 11/26/2020  . Other abnormal glucose 11/26/2020  . Pain in joint, pelvic region and thigh 11/26/2020  . Patellofemoral syndrome 11/26/2020  . Abdominal pain 11/26/2020  . Breast pain 11/26/2020  . Chest pain 11/26/2020  . Pelvic pain 11/26/2020  . Pre-operative examination 11/26/2020  . Screening for malignant neoplasm of cervix 11/26/2020  . Sebaceous cyst 11/26/2020  . Sinusitis 11/26/2020  . Surgery follow-up examination 11/26/2020  . Ureteral stone 11/26/2020  . Vaginal discharge 11/26/2020  . Nephrolithiasis 06/24/2019  . Herpes simplex 12/25/2018  . Cyst of ovary 12/14/2015  . History of hypertension 12/14/2015  . Patellofemoral syndrome of right knee 12/14/2015  . Uterine leiomyoma 12/14/2015    Current Outpatient Medications on File Prior to Visit  Medication Sig Dispense Refill  . albuterol (VENTOLIN HFA) 108 (90 Base) MCG/ACT inhaler Inhale 2 puffs into the lungs every 6 (six) hours as needed for wheezing or shortness of breath. 18 g 1  .  baclofen (LIORESAL) 10 MG tablet Take 10 mg by mouth 2 (two) times daily as needed.    . docusate sodium (COLACE) 100 MG capsule Take 1 capsule (100 mg total) by mouth 2 (two) times daily. 10 capsule 0  . EMGALITY 120 MG/ML SOAJ Inject into the skin.    Eduard Roux (AIMOVIG) 140 MG/ML SOAJ Aimovig Autoinjector 140 mg/mL subcutaneous auto-injector    . HYDROcodone-acetaminophen (NORCO) 10-325 MG tablet Take 1 tablet by mouth every 6 (six) hours as needed for up to 7 days. 28 tablet 0  . ibuprofen (ADVIL) 800 MG tablet Take 1 tablet (800 mg total) by mouth every 8 (eight) hours as needed. 30 tablet 0  . meloxicam (MOBIC) 15 MG tablet TAKE 1 TABLET BY MOUTH EVERY DAY 30 tablet 0  . montelukast (SINGULAIR) 10 MG tablet TAKE 1 TABLET BY MOUTH EVERY DAY 90 tablet 1  . phentermine (ADIPEX-P) 37.5 MG tablet Take 37.5 mg by mouth daily.    . promethazine (PHENERGAN) 25 MG tablet Take 1 tablet (25 mg total) by mouth every 8 (eight) hours as needed for nausea or vomiting. 20 tablet 0  . tinidazole (TINDAMAX) 500 MG tablet tinidazole 500 mg tablet    . topiramate (TOPAMAX) 200 MG tablet Take 200 mg by mouth daily.    Marland Kitchen topiramate ER (QUDEXY XR) 200 MG CS24 sprinkle capsule Qudexy XR 200 mg capsule sprinkle,extended release  TAKE 1 BY MOUTH ONCE DAILY     No current facility-administered medications on file prior to visit.    No Known Allergies  Objective: There were no vitals filed for this visit.  General: No  acute distress, AAOx3  Right foot: Sutures intact with no gapping or dehiscence at surgical site, mild swelling to right forefoot, no erythema, no warmth, no drainage, no signs of infection noted, Capillary fill time <3 seconds in all digits, gross sensation present via light touch to right foot.  Mild guarding with range of motion at right first MPJ due to swelling and pain.  No pain with calf compression.   Assessment and Plan:  Problem List Items Addressed This Visit   None   Visit  Diagnoses    S/P foot surgery, right    -  Primary   Bunion, right foot       Right foot pain           -Patient seen and evaluated -Sutures removed. Applied dry sterile dressing to surgical site right foot secured with ACE wrap and stockinet  -Advised patient to make sure to keep dressings clean, dry, and intact to right surgical site until next week and then may remove for a shower -Advised patient to continue with cam boot and crutches and nonweightbearing for 2 more weeks -Advised patient to limit activity to necessity  -Advised patient to ice and elevate as instructed -Continue with Motrin and hydrocodone for pain; patient to pick up this Rx; PA was done on Monday  -Advised patient to use ace wrap for left foot/ankle to help with any over use pain -Will plan for xrays on right foot and discuss possibility of weightbearing at next office visit. In the meantime, patient to call office if any issues or problems arise.   Landis Martins, DPM

## 2020-12-03 NOTE — Telephone Encounter (Signed)
Spoke to patient, she will be in the office for an appointment today as well

## 2020-12-04 ENCOUNTER — Encounter: Payer: Self-pay | Admitting: Sports Medicine

## 2020-12-04 NOTE — Telephone Encounter (Signed)
Please advise 

## 2020-12-10 ENCOUNTER — Encounter: Payer: Self-pay | Admitting: Sports Medicine

## 2020-12-13 ENCOUNTER — Other Ambulatory Visit: Payer: Self-pay | Admitting: Sports Medicine

## 2020-12-14 NOTE — Telephone Encounter (Signed)
Please advise 

## 2020-12-17 ENCOUNTER — Other Ambulatory Visit: Payer: Self-pay

## 2020-12-17 ENCOUNTER — Encounter: Payer: Self-pay | Admitting: Sports Medicine

## 2020-12-17 ENCOUNTER — Ambulatory Visit (INDEPENDENT_AMBULATORY_CARE_PROVIDER_SITE_OTHER): Payer: BC Managed Care – PPO

## 2020-12-17 ENCOUNTER — Ambulatory Visit (INDEPENDENT_AMBULATORY_CARE_PROVIDER_SITE_OTHER): Payer: BC Managed Care – PPO | Admitting: Sports Medicine

## 2020-12-17 DIAGNOSIS — Z9889 Other specified postprocedural states: Secondary | ICD-10-CM

## 2020-12-17 DIAGNOSIS — M79671 Pain in right foot: Secondary | ICD-10-CM

## 2020-12-17 DIAGNOSIS — M21611 Bunion of right foot: Secondary | ICD-10-CM

## 2020-12-17 NOTE — Progress Notes (Addendum)
Subjective: Wendy Parks is a 40 y.o. female patient seen today in office for POV #3 (DOS 11/17/2019), S/P right [name] bunionectomy.  Patient admits some pain at surgical site of throbbing, denies headache, chest pain, shortness of breath, nausea, vomiting, fever, or chills. No other issues noted.  Patient Active Problem List   Diagnosis Date Noted  . Allergic rhinitis 11/26/2020  . Amenorrhea 11/26/2020  . Benign essential hypertension 11/26/2020  . Body mass index (BMI) 34.0-34.9, adult 11/26/2020  . Obesity, unspecified 11/26/2020  . Other specified counseling 11/26/2020  . Dysuria 11/26/2020  . Effusion of hand joint 11/26/2020  . Elevated blood-pressure reading without diagnosis of hypertension 11/26/2020  . Esophageal reflux 11/26/2020  . Gastritis 11/26/2020  . Neck pain 11/26/2020  . Oligomenorrhea 11/26/2020  . Other abnormal glucose 11/26/2020  . Pain in joint, pelvic region and thigh 11/26/2020  . Patellofemoral syndrome 11/26/2020  . Abdominal pain 11/26/2020  . Breast pain 11/26/2020  . Chest pain 11/26/2020  . Pelvic pain 11/26/2020  . Pre-operative examination 11/26/2020  . Screening for malignant neoplasm of cervix 11/26/2020  . Sebaceous cyst 11/26/2020  . Sinusitis 11/26/2020  . Surgery follow-up examination 11/26/2020  . Ureteral stone 11/26/2020  . Vaginal discharge 11/26/2020  . Nephrolithiasis 06/24/2019  . Herpes simplex 12/25/2018  . Cyst of ovary 12/14/2015  . History of hypertension 12/14/2015  . Patellofemoral syndrome of right knee 12/14/2015  . Uterine leiomyoma 12/14/2015    Current Outpatient Medications on File Prior to Visit  Medication Sig Dispense Refill  . albuterol (VENTOLIN HFA) 108 (90 Base) MCG/ACT inhaler Inhale 2 puffs into the lungs every 6 (six) hours as needed for wheezing or shortness of breath. 18 g 1  . baclofen (LIORESAL) 10 MG tablet Take 10 mg by mouth 2 (two) times daily as needed.    . docusate sodium (COLACE)  100 MG capsule Take 1 capsule (100 mg total) by mouth 2 (two) times daily. 10 capsule 0  . EMGALITY 120 MG/ML SOAJ Inject into the skin.    Eduard Roux (AIMOVIG) 140 MG/ML SOAJ Aimovig Autoinjector 140 mg/mL subcutaneous auto-injector    . HYDROcodone-acetaminophen (NORCO) 10-325 MG tablet Take 1 tablet by mouth every 6 (six) hours as needed.    Marland Kitchen ibuprofen (ADVIL) 800 MG tablet Take 1 tablet (800 mg total) by mouth every 8 (eight) hours as needed. 30 tablet 0  . meloxicam (MOBIC) 15 MG tablet TAKE 1 TABLET BY MOUTH EVERY DAY 30 tablet 0  . montelukast (SINGULAIR) 10 MG tablet TAKE 1 TABLET BY MOUTH EVERY DAY 90 tablet 1  . phentermine (ADIPEX-P) 37.5 MG tablet Take 37.5 mg by mouth daily.    . promethazine (PHENERGAN) 25 MG tablet Take 1 tablet (25 mg total) by mouth every 8 (eight) hours as needed for nausea or vomiting. 20 tablet 0  . tinidazole (TINDAMAX) 500 MG tablet tinidazole 500 mg tablet    . topiramate (TOPAMAX) 200 MG tablet Take 200 mg by mouth daily.    Marland Kitchen topiramate ER (QUDEXY XR) 200 MG CS24 sprinkle capsule Qudexy XR 200 mg capsule sprinkle,extended release  TAKE 1 BY MOUTH ONCE DAILY     No current facility-administered medications on file prior to visit.    No Known Allergies  Objective: There were no vitals filed for this visit.  General: No acute distress, AAOx3  Right foot: Incision healing well, no gapping or dehiscence at surgical site, mild swelling to right forefoot, no erythema, no warmth, no drainage, no signs  of infection noted, Capillary fill time <3 seconds in all digits, gross sensation present via light touch to right foot.  Mild guarding with range of motion at right first MPJ due to swelling and pain but able to get 25df and 10pf.  No pain with calf compression.   Assessment and Plan:  Problem List Items Addressed This Visit   None   Visit Diagnoses    S/P foot surgery, right    -  Primary   Relevant Orders   DG Foot Complete Right   Bunion,  right foot       Right foot pain         -Patient seen and evaluated -Xrays reviewed -May weightbear with boot and then transition after 1 week to post op shoe -Dispensed surgical anklet to use as directed -Advised patient to limit activity to necessity  -Advised patient to ice and elevate as instructed -Continue with Mobic for inflammation and tylenol as needed  -Will plan for xrays and increasing range of motion exercises at next visit. Patient to call office if any issues or problems arise.   Landis Martins, DPM

## 2020-12-22 ENCOUNTER — Encounter: Payer: Self-pay | Admitting: Sports Medicine

## 2020-12-22 NOTE — Progress Notes (Signed)
Work excuse

## 2020-12-23 ENCOUNTER — Encounter: Payer: Self-pay | Admitting: Sports Medicine

## 2020-12-23 NOTE — Telephone Encounter (Signed)
Letter sent.

## 2020-12-30 DIAGNOSIS — G43719 Chronic migraine without aura, intractable, without status migrainosus: Secondary | ICD-10-CM | POA: Diagnosis not present

## 2020-12-30 DIAGNOSIS — M791 Myalgia, unspecified site: Secondary | ICD-10-CM | POA: Diagnosis not present

## 2020-12-30 DIAGNOSIS — G518 Other disorders of facial nerve: Secondary | ICD-10-CM | POA: Diagnosis not present

## 2020-12-30 DIAGNOSIS — M542 Cervicalgia: Secondary | ICD-10-CM | POA: Diagnosis not present

## 2021-01-02 ENCOUNTER — Emergency Department (HOSPITAL_COMMUNITY)
Admission: EM | Admit: 2021-01-02 | Discharge: 2021-01-03 | Disposition: A | Discharge status: Home / Self Care | Payer: BC Managed Care – PPO | Attending: Emergency Medicine | Admitting: Emergency Medicine

## 2021-01-02 ENCOUNTER — Encounter (HOSPITAL_COMMUNITY): Payer: Self-pay | Admitting: Emergency Medicine

## 2021-01-02 ENCOUNTER — Other Ambulatory Visit: Payer: Self-pay

## 2021-01-02 DIAGNOSIS — J028 Acute pharyngitis due to other specified organisms: Secondary | ICD-10-CM | POA: Diagnosis not present

## 2021-01-02 DIAGNOSIS — Z79899 Other long term (current) drug therapy: Secondary | ICD-10-CM | POA: Insufficient documentation

## 2021-01-02 DIAGNOSIS — B9789 Other viral agents as the cause of diseases classified elsewhere: Secondary | ICD-10-CM | POA: Insufficient documentation

## 2021-01-02 DIAGNOSIS — I10 Essential (primary) hypertension: Secondary | ICD-10-CM | POA: Diagnosis not present

## 2021-01-02 DIAGNOSIS — J0191 Acute recurrent sinusitis, unspecified: Secondary | ICD-10-CM | POA: Diagnosis not present

## 2021-01-02 DIAGNOSIS — U071 COVID-19: Secondary | ICD-10-CM | POA: Diagnosis not present

## 2021-01-02 DIAGNOSIS — J029 Acute pharyngitis, unspecified: Secondary | ICD-10-CM | POA: Diagnosis not present

## 2021-01-02 MED ORDER — ALUM & MAG HYDROXIDE-SIMETH 200-200-20 MG/5ML PO SUSP
30.0000 mL | Freq: Once | ORAL | Status: AC
  Administered 2021-01-02: 30 mL via ORAL
  Filled 2021-01-02: qty 30

## 2021-01-02 MED ORDER — LIDOCAINE VISCOUS HCL 2 % MT SOLN
15.0000 mL | Freq: Once | OROMUCOSAL | Status: AC
  Administered 2021-01-02: 15 mL via ORAL
  Filled 2021-01-02: qty 15

## 2021-01-02 NOTE — ED Triage Notes (Signed)
Patient reports sore throat and fever x2 days.

## 2021-01-02 NOTE — ED Provider Notes (Signed)
[facility] DEPT Provider Note   CSN: [id number] Arrival date & time: 01/02/21  2237     History Chief Complaint  Patient presents with  . Sore Throat    Wendy Parks is a 40 y.o. female presents to the Emergency Department complaining of gradual, persistent, progressively worsening nasal congestion, rhinorrhea and postnasal drip acutely worsening 3 days ago.  Patient reports she believes this was allergies and was taking Singulair and Flonase without significant relief.  Reports that 3 days ago she developed fever to 101 at home, cough, severe sore throat, malaise, fatigue.  Patient has been taking Tylenol with moderate improvement in her fevers.  Denies known sick contacts or known COVID contacts.  Is vaccinated for COVID.  Reports that sore throat and coughing keeps her up at night.  Additionally she has been taking Sudafed for her congestion and cough.    The history is provided by the patient and medical records. No language interpreter was used.       Past Medical History:  Diagnosis Date  . Kidney stone     Patient Active Problem List   Diagnosis Date Noted  . Allergic rhinitis 11/26/2020  . Amenorrhea 11/26/2020  . Benign essential hypertension 11/26/2020  . Body mass index (BMI) 34.0-34.9, adult 11/26/2020  . Obesity, unspecified 11/26/2020  . Other specified counseling 11/26/2020  . Dysuria 11/26/2020  . Effusion of hand joint 11/26/2020  . Elevated blood-pressure reading without diagnosis of hypertension 11/26/2020  . Esophageal reflux 11/26/2020  . Gastritis 11/26/2020  . Neck pain 11/26/2020  . Oligomenorrhea 11/26/2020  . Other abnormal glucose 11/26/2020  . Pain in joint, pelvic region and thigh 11/26/2020  . Patellofemoral syndrome 11/26/2020  . Abdominal pain 11/26/2020  . Breast pain 11/26/2020  . Chest pain 11/26/2020  . Pelvic pain 11/26/2020  . Pre-operative examination 11/26/2020  . Screening for malignant  neoplasm of cervix 11/26/2020  . Sebaceous cyst 11/26/2020  . Sinusitis 11/26/2020  . Surgery follow-up examination 11/26/2020  . Ureteral stone 11/26/2020  . Vaginal discharge 11/26/2020  . Nephrolithiasis 06/24/2019  . Herpes simplex 12/25/2018  . Cyst of ovary 12/14/2015  . History of hypertension 12/14/2015  . Patellofemoral syndrome of right knee 12/14/2015  . Uterine leiomyoma 12/14/2015    Past Surgical History:  Procedure Laterality Date  . ENDOMETRIAL ABLATION  01/2019   Dr. Mancel Bale  . KIDNEY STONE SURGERY    . TUBAL LIGATION  2012     OB History   No obstetric history on file.     Family History  Problem Relation Age of Onset  . Healthy Mother   . Dementia Father   . Cancer Father     Social History   Tobacco Use  . Smoking status: Never Smoker  . Smokeless tobacco: Never Used  Vaping Use  . Vaping Use: Never used  Substance Use Topics  . Alcohol use: Yes    Comment: occasionally  . Drug use: Never    Home Medications Prior to Admission medications   Medication Sig Start Date End Date Taking? Authorizing Provider  amoxicillin (AMOXIL) 500 MG capsule Take 1 capsule (500 mg total) by mouth 2 (two) times daily for 10 days. 01/03/21 01/13/21 Yes Godwin Tedesco, Jarrett Soho, PA-C  albuterol (VENTOLIN HFA) 108 (90 Base) MCG/ACT inhaler Inhale 2 puffs into the lungs every 6 (six) hours as needed for wheezing or shortness of breath. 07/28/19   Glendale Chard, MD  baclofen (LIORESAL) 10 MG tablet Take 10 mg by mouth  2 (two) times daily as needed. 06/14/19   [provider]  docusate sodium (COLACE) 100 MG capsule Take 1 capsule (100 mg total) by mouth 2 (two) times daily. 11/16/20   Stover, Titorya, DPM  EMGALITY 120 MG/ML SOAJ Inject into the skin. 11/11/20   [provider]  Erenumab-aooe (AIMOVIG) 140 MG/ML SOAJ Aimovig Autoinjector 140 mg/mL subcutaneous auto-injector    [provider]  HYDROcodone-acetaminophen (NORCO) 10-325 MG tablet  Take 1 tablet by mouth every 6 (six) hours as needed. 12/04/20   [provider]  ibuprofen (ADVIL) 800 MG tablet Take 1 tablet (800 mg total) by mouth every 8 (eight) hours as needed. 11/26/20   Landis Martins, DPM  meloxicam (MOBIC) 15 MG tablet TAKE 1 TABLET BY MOUTH EVERY DAY 12/14/20   Landis Martins, DPM  montelukast (SINGULAIR) 10 MG tablet TAKE 1 TABLET BY MOUTH EVERY DAY 09/28/20   Glendale Chard, MD  phentermine (ADIPEX-P) 37.5 MG tablet Take 37.5 mg by mouth daily. 11/12/20   [provider]  promethazine (PHENERGAN) 25 MG tablet Take 1 tablet (25 mg total) by mouth every 8 (eight) hours as needed for nausea or vomiting. 11/16/20   Landis Martins, DPM  tinidazole (TINDAMAX) 500 MG tablet tinidazole 500 mg tablet    [provider]  topiramate (TOPAMAX) 200 MG tablet Take 200 mg by mouth daily. 11/11/20   [provider]  topiramate ER (QUDEXY XR) 200 MG CS24 sprinkle capsule Qudexy XR 200 mg capsule sprinkle,extended release  TAKE 1 BY MOUTH ONCE DAILY    [provider]    Allergies    Patient has no known allergies.  Review of Systems   Review of Systems  Constitutional: Positive for chills, fatigue and fever.  HENT: Positive for congestion, rhinorrhea, sinus pressure and sore throat.   Eyes: Negative for visual disturbance.  Respiratory: Positive for cough. Negative for shortness of breath and wheezing.   Cardiovascular: Negative for leg swelling.  Gastrointestinal: Negative for abdominal pain, nausea and vomiting.  Genitourinary: Negative for dysuria.  Musculoskeletal: Negative for back pain, neck pain and neck stiffness.  Skin: Negative for rash.  Allergic/Immunologic: Negative for immunocompromised state.  Neurological: Negative for dizziness and syncope.  Hematological: Negative for adenopathy. Does not bruise/bleed easily.  Psychiatric/Behavioral: The patient is not nervous/anxious.     Physical Exam Updated Vital Signs BP  (!) 175/102   Pulse 93   Temp 99.7 F (37.6 C)   Resp 18   SpO2 98%   Physical Exam Vitals and nursing note reviewed.  Constitutional:      General: She is not in acute distress.    Appearance: She is not diaphoretic.  HENT:     Head: Normocephalic.     Mouth/Throat:     Mouth: Mucous membranes are moist.     Pharynx: Uvula midline. Posterior oropharyngeal erythema present. No pharyngeal swelling or uvula swelling.     Comments: Mild posterior oropharyngeal erythema without exudate or edema. Eyes:     General: No scleral icterus.    Conjunctiva/sclera: Conjunctivae normal.  Cardiovascular:     Rate and Rhythm: Normal rate and regular rhythm.     Pulses: Normal pulses.          Radial pulses are 2+ on the right side and 2+ on the left side.  Pulmonary:     Effort: No tachypnea, accessory muscle usage, prolonged expiration, respiratory distress or retractions.     Breath sounds: No stridor.     Comments:  Equal chest rise. No increased work of breathing. Abdominal:     General: There is no distension.     Palpations: Abdomen is soft.     Tenderness: There is no abdominal tenderness. There is no guarding or rebound.  Musculoskeletal:     Cervical back: Normal range of motion.     Comments: Moves all extremities equally and without difficulty.  Skin:    General: Skin is warm and dry.     Capillary Refill: Capillary refill takes less than 2 seconds.  Neurological:     Mental Status: She is alert.     GCS: GCS eye subscore is 4. GCS verbal subscore is 5. GCS motor subscore is 6.     Comments: Speech is clear and goal oriented.  Psychiatric:        Mood and Affect: Mood normal.     ED Results / Procedures / Treatments   Labs (all labs ordered are listed, but only abnormal results are displayed) Labs Reviewed  GROUP A STREP BY PCR  SARS CORONAVIRUS 2 (TAT 6-24 HRS)    EKG None  Radiology No results found.  Procedures Procedures   Medications Ordered in  ED Medications  alum & mag hydroxide-simeth (MAALOX/MYLANTA) 200-200-20 MG/5ML suspension 30 mL (30 mLs Oral Given 01/02/21 2331)    And  lidocaine (XYLOCAINE) 2 % viscous mouth solution 15 mL (15 mLs Oral Given 01/02/21 2331)    ED Course  I have reviewed the triage vital signs and the nursing notes.  Pertinent labs & imaging results that were available during my care of the patient were reviewed by me and considered in my medical decision making (see chart for details).  Clinical Course as of 01/03/21 0051  Sat Jan 02, 2021  2328 BP(!): 175/102 Pt noted to be hypertensive in triage; pt has been taking pseudoephedrine for her nasal congestion. [HM]  Sun Jan 03, 2021  0043 BP: 135/90 improved [HM]    Clinical Course User Index [HM] Jayson Waterhouse, Gwenlyn Perking   MDM Rules/Calculators/A&P                          Patient presents with nasal congestion for several weeks, symptoms most consistent with COVID/URI.  Patient concerned she may have strep throat.  Clinically not consistent however will swab.  Symptomatic therapy here in the emergency department.  12:43 AM Strep test negative.  Considering more than 2 weeks of nasal congestion now with fevers and worsening symptoms we will treat for acute sinusitis.  COVID and influenza test pending.  Patient is well-appearing tolerating p.o. without difficulty.  Hypertension has resolved without intervention.  She will need close follow-up with primary care for repeat blood pressure check.  Recommend decreasing Sudafed usage.  Final Clinical Impression(s) / ED Diagnoses Final diagnoses:  Viral pharyngitis  Acute recurrent sinusitis, unspecified location    Rx / DC Orders ED Discharge Orders         Ordered    amoxicillin (AMOXIL) 500 MG capsule  2 times daily        01/03/21 0045           Odaliz Mcqueary, Gwenlyn Perking 01/03/21 0051    Fatima Blank, MD 01/04/21 2123

## 2021-01-03 LAB — GROUP A STREP BY PCR: Group A Strep by PCR: NOT DETECTED

## 2021-01-03 LAB — SARS CORONAVIRUS 2 (TAT 6-24 HRS): SARS Coronavirus 2: POSITIVE — AB

## 2021-01-03 MED ORDER — AMOXICILLIN 500 MG PO CAPS: 500.0000 mg | ORAL_CAPSULE | Freq: Two times a day (BID) | ORAL | 0 refills | Status: AC

## 2021-01-03 NOTE — Discharge Instructions (Addendum)
1. Medications: Amoxicillin, usual home medications 2. Treatment: rest, drink plenty of fluids, Tylenol and ibuprofen for fever and pain control 3. Follow Up: Please followup with your primary doctor in 3-5 days for discussion of your diagnoses and further evaluation after today's visit; if you do not have a primary care doctor use the resource guide provided to find one; Please return to the ER for worsening symptoms, inability to swallow or other concerns

## 2021-01-04 ENCOUNTER — Encounter: Payer: Self-pay | Admitting: Nurse Practitioner

## 2021-01-04 ENCOUNTER — Other Ambulatory Visit: Payer: Self-pay

## 2021-01-04 ENCOUNTER — Telehealth (INDEPENDENT_AMBULATORY_CARE_PROVIDER_SITE_OTHER): Payer: BC Managed Care – PPO | Admitting: Nurse Practitioner

## 2021-01-04 VITALS — BP 142/82 | HR 80 | Temp 98.2°F

## 2021-01-04 DIAGNOSIS — R059 Cough, unspecified: Secondary | ICD-10-CM

## 2021-01-04 DIAGNOSIS — J018 Other acute sinusitis: Secondary | ICD-10-CM

## 2021-01-04 DIAGNOSIS — U071 COVID-19: Secondary | ICD-10-CM | POA: Diagnosis not present

## 2021-01-04 MED ORDER — BENZONATATE 100 MG PO CAPS: 100.0000 mg | ORAL_CAPSULE | Freq: Four times a day (QID) | ORAL | 1 refills | Status: AC | PRN

## 2021-01-04 NOTE — Addendum Note (Signed)
Addended by: Minette Brine F on: 01/04/2021 05:53 PM   Modules accepted: Orders

## 2021-01-04 NOTE — Patient Instructions (Signed)

## 2021-01-04 NOTE — Progress Notes (Signed)
Virtual Visit via My Chart   This visit type was conducted due to national recommendations for restrictions regarding the COVID-19 Pandemic (e.g. social distancing) in an effort to limit this patient's exposure and mitigate transmission in our community.  Due to her co-morbid illnesses, this patient is at least at moderate risk for complications without adequate follow up.  This format is felt to be most appropriate for this patient at this time.  All issues noted in this document were discussed and addressed.  A limited physical exam was performed with this format.    This visit type was conducted due to national recommendations for restrictions regarding the COVID-19 Pandemic (e.g. social distancing) in an effort to limit this patient's exposure and mitigate transmission in our community.  Patients identity confirmed using two different identifiers.  This format is felt to be most appropriate for this patient at this time.  All issues noted in this document were discussed and addressed.  No physical exam was performed (except for noted visual exam findings with Video Visits).    Date:  01/04/2021   ID:  Wendy Parks, DOB 29-Jan-1981, MRN [id number]  Patient Location:  Home - spoke with Wendy Parks  Provider location:   Office    Chief Complaint:  Positive covid  History of Present Illness:    Wendy Parks is a 40 y.o. female who presents via video conferencing for a telehealth visit today.    The patient does have symptoms concerning for COVID-19 infection (fever, chills, cough, or new shortness of breath).   Went to ER on Saturday - fever, runny nose/post nasal drainage. She was also diagnosed with sinus infection and cough. Amoxicillin for sinus infection. She has an inhaler. She had covid in December 2020.  She is eating better - eating mostly soups, she is down to 199 lbs, she was 219 lbs 2 weeks ago. She had sinus symptoms 2 weeks ago then her 2 kids had covid and when taking  care of them she was infected with covid. Saturday her symptoms worsen. 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 She has been back at work 2 weeks after foot surgery. She did not work today due to not feeling well.     Past Medical History:  Diagnosis Date  . Kidney stone    Past Surgical History:  Procedure Laterality Date  . ENDOMETRIAL ABLATION  01/2019   Dr. Mancel Bale  . KIDNEY STONE SURGERY    . TUBAL LIGATION  2012     Current Meds  Medication Sig  . albuterol (VENTOLIN HFA) 108 (90 Base) MCG/ACT inhaler Inhale 2 puffs into the lungs every 6 (six) hours as needed for wheezing or shortness of breath.  Marland Kitchen amoxicillin (AMOXIL) 500 MG capsule Take 1 capsule (500 mg total) by mouth 2 (two) times daily for 10 days.  . baclofen (LIORESAL) 10 MG tablet Take 10 mg by mouth 2 (two) times daily as needed.  . benzonatate (TESSALON PERLES) 100 MG capsule Take 1 capsule (100 mg total) by mouth every 6 (six) hours as needed.  . docusate sodium (COLACE) 100 MG capsule Take 1 capsule (100 mg total) by mouth 2 (two) times daily.  Marland Kitchen EMGALITY 120 MG/ML SOAJ Inject into the skin.  Marland Kitchen ibuprofen (ADVIL) 800 MG tablet Take 1 tablet (800 mg total) by mouth every 8 (eight) hours as needed.  . meloxicam (MOBIC) 15 MG tablet TAKE 1 TABLET BY MOUTH EVERY DAY  . montelukast (SINGULAIR) 10 MG tablet TAKE 1 TABLET  BY MOUTH EVERY DAY  . promethazine (PHENERGAN) 25 MG tablet Take 1 tablet (25 mg total) by mouth every 8 (eight) hours as needed for nausea or vomiting.  . topiramate (TOPAMAX) 200 MG tablet Take 200 mg by mouth daily.     Allergies:   Patient has no known allergies.   Social History   Tobacco Use  . Smoking status: Never Smoker  . Smokeless tobacco: Never Used  Vaping Use  . Vaping Use: Never used  Substance Use Topics  . Alcohol use: Yes    Comment: occasionally  . Drug use: Never     Family Hx: The patient's family history includes Cancer in her father; Dementia in her father; Healthy in her  mother.  ROS:   Please see the history of present illness.    Review of Systems  Constitutional: Positive for malaise/fatigue. Negative for fever.  Respiratory: Positive for cough and shortness of breath (intermittent mild shortness of breath). Negative for sputum production.   Cardiovascular: Negative for chest pain.  Neurological: Negative for dizziness and tingling.  Psychiatric/Behavioral: Negative for depression.    All other systems reviewed and are negative.   Labs/Other Tests and Data Reviewed:    Recent Labs: 07/01/2020: ALT 10; BUN 11; Creatinine, Ser 0.84; Hemoglobin 13.0; Platelets 288; Potassium 4.2; Sodium 137; TSH 1.430   Recent Lipid Panel Lab Results  Component Value Date/Time   CHOL 172 07/01/2020 10:28 AM   TRIG 114 07/01/2020 10:28 AM   HDL 63 07/01/2020 10:28 AM   CHOLHDL 2.7 07/01/2020 10:28 AM   LDLCALC 89 07/01/2020 10:28 AM    Wt Readings from Last 3 Encounters:  07/01/20 207 lb 9.6 oz (94.2 kg)  06/24/19 192 lb 3.2 oz (87.2 kg)  05/04/18 176 lb (79.8 kg)     Exam:    Vital Signs:  BP (!) 142/82   Pulse 80   Temp 98.2 F (36.8 C) (Oral)     Physical Exam Vitals reviewed.  Constitutional:      General: She is not in acute distress.    Appearance: She is obese.  Pulmonary:     Effort: Pulmonary effort is normal. No respiratory distress.     Breath sounds: No wheezing.     Comments: Heard patient cough a couple times during visit, slightly productive. She does seem to have mild intermittent dyspnea when talking.  Neurological:     General: No focal deficit present.     Mental Status: She is alert and oriented to person, place, and time.     Cranial Nerves: No cranial nerve deficit.     Motor: No weakness.  Psychiatric:        Mood and Affect: Mood and affect normal.        Behavior: Behavior normal.        Thought Content: Thought content normal.        Cognition and Memory: Memory normal.        Judgment: Judgment normal.      ASSESSMENT & PLAN:     1. COVID-19  She was diagnosed on Saturday at ER and she will need to remain out of work until May 23rd and in quarantine  2. Cough  Sent Rx for tessalon perles  She is also on amoxicillin for a sinus infection - benzonatate (TESSALON PERLES) 100 MG capsule; Take 1 capsule (100 mg total) by mouth every 6 (six) hours as needed.  Dispense: 30 capsule; Refill: 1  3. Acute non-recurrent sinusitis of  other sinus  She is given amoxicillin from the ER       COVID-19 Education: The signs and symptoms of COVID-19 were discussed with the patient and how to seek care for testing (follow up with PCP or arrange E-visit).  The importance of social distancing was discussed today.  Patient Risk:   After full review of this patients clinical status, I feel that they are at least moderate risk at this time.  Time:   Today, I have spent 11 minutes/ seconds with the patient with telehealth technology discussing above diagnoses.     Medication Adjustments/Labs and Tests Ordered: Current medicines are reviewed at length with the patient today.  Concerns regarding medicines are outlined above.   Tests Ordered: No orders of the defined types were placed in this encounter.   Medication Changes: Meds ordered this encounter  Medications  . benzonatate (TESSALON PERLES) 100 MG capsule    Sig: Take 1 capsule (100 mg total) by mouth every 6 (six) hours as needed.    Dispense:  30 capsule    Refill:  1    Disposition:  Follow up prn  Signed, Minette Brine, FNP

## 2021-01-14 ENCOUNTER — Ambulatory Visit (INDEPENDENT_AMBULATORY_CARE_PROVIDER_SITE_OTHER): Payer: BC Managed Care – PPO

## 2021-01-14 ENCOUNTER — Encounter: Payer: Self-pay | Admitting: Sports Medicine

## 2021-01-14 ENCOUNTER — Ambulatory Visit (INDEPENDENT_AMBULATORY_CARE_PROVIDER_SITE_OTHER): Payer: BC Managed Care – PPO | Admitting: Sports Medicine

## 2021-01-14 ENCOUNTER — Other Ambulatory Visit: Payer: Self-pay

## 2021-01-14 DIAGNOSIS — M21611 Bunion of right foot: Secondary | ICD-10-CM

## 2021-01-14 DIAGNOSIS — M79671 Pain in right foot: Secondary | ICD-10-CM

## 2021-01-14 DIAGNOSIS — Z9889 Other specified postprocedural states: Secondary | ICD-10-CM

## 2021-01-14 NOTE — Progress Notes (Signed)
Subjective: Wendy Parks is a 40 y.o. female patient seen today in office for POV #4 (DOS 11/17/2019), S/P right [name] bunionectomy.  Patient reports that she is doing good, toe feels weak to bend, denies headache, chest pain, shortness of breath, nausea, vomiting, fever, or chills. No other issues noted.  Patient Active Problem List   Diagnosis Date Noted  . Allergic rhinitis 11/26/2020  . Amenorrhea 11/26/2020  . Benign essential hypertension 11/26/2020  . Body mass index (BMI) 34.0-34.9, adult 11/26/2020  . Obesity, unspecified 11/26/2020  . Other specified counseling 11/26/2020  . Dysuria 11/26/2020  . Effusion of hand joint 11/26/2020  . Elevated blood-pressure reading without diagnosis of hypertension 11/26/2020  . Esophageal reflux 11/26/2020  . Gastritis 11/26/2020  . Neck pain 11/26/2020  . Oligomenorrhea 11/26/2020  . Other abnormal glucose 11/26/2020  . Pain in joint, pelvic region and thigh 11/26/2020  . Patellofemoral syndrome 11/26/2020  . Abdominal pain 11/26/2020  . Breast pain 11/26/2020  . Chest pain 11/26/2020  . Pelvic pain 11/26/2020  . Pre-operative examination 11/26/2020  . Screening for malignant neoplasm of cervix 11/26/2020  . Sebaceous cyst 11/26/2020  . Sinusitis 11/26/2020  . Surgery follow-up examination 11/26/2020  . Ureteral stone 11/26/2020  . Vaginal discharge 11/26/2020  . Nephrolithiasis 06/24/2019  . Herpes simplex 12/25/2018  . Cyst of ovary 12/14/2015  . History of hypertension 12/14/2015  . Patellofemoral syndrome of right knee 12/14/2015  . Uterine leiomyoma 12/14/2015    Current Outpatient Medications on File Prior to Visit  Medication Sig Dispense Refill  . albuterol (VENTOLIN HFA) 108 (90 Base) MCG/ACT inhaler Inhale 2 puffs into the lungs every 6 (six) hours as needed for wheezing or shortness of breath. 18 g 1  . baclofen (LIORESAL) 10 MG tablet Take 10 mg by mouth 2 (two) times daily as needed.    . benzonatate  (TESSALON PERLES) 100 MG capsule Take 1 capsule (100 mg total) by mouth every 6 (six) hours as needed. 30 capsule 1  . BOTOX 100 units SOLR injection     . docusate sodium (COLACE) 100 MG capsule Take 1 capsule (100 mg total) by mouth 2 (two) times daily. 10 capsule 0  . EMGALITY 120 MG/ML SOAJ Inject into the skin.    Marland Kitchen ibuprofen (ADVIL) 800 MG tablet Take 1 tablet (800 mg total) by mouth every 8 (eight) hours as needed. 30 tablet 0  . meloxicam (MOBIC) 15 MG tablet TAKE 1 TABLET BY MOUTH EVERY DAY 30 tablet 0  . montelukast (SINGULAIR) 10 MG tablet TAKE 1 TABLET BY MOUTH EVERY DAY 90 tablet 1  . promethazine (PHENERGAN) 25 MG tablet Take 1 tablet (25 mg total) by mouth every 8 (eight) hours as needed for nausea or vomiting. 20 tablet 0  . topiramate (TOPAMAX) 200 MG tablet Take 200 mg by mouth daily.     No current facility-administered medications on file prior to visit.    No Known Allergies  Objective: There were no vitals filed for this visit.  General: No acute distress, AAOx3  Right foot: Incision well healed, no gapping or dehiscence at surgical site, mild swelling to right forefoot, no erythema, no warmth, no drainage, no signs of infection noted, Capillary fill time <3 seconds in all digits, gross sensation present via light touch to right foot.  Minimal garding with range of motion at right first MPJ due to swelling and pain but able to get 30df and 10pf.  No pain with calf compression.   Right  foot x-ray hardware intact osteotomy site appears to be healed on lateral view  Assessment and Plan:  Problem List Items Addressed This Visit   None   Visit Diagnoses    Bunion, right foot    -  Primary   Relevant Orders   DG Foot Complete Right (Completed)   S/P foot surgery, right       Right foot pain         -Patient seen and evaluated -Xrays reviewed -May slowly transition to weightbearing with tennis shoe -Encouraged gentle range of motion exercises and scar  treatment -Advised patient to limit activity to tolerance -Advised patient to ice and elevate as needed -Patient to return in 1 month for postoperative check and x-rays.  Landis Martins, DPM

## 2021-01-20 DIAGNOSIS — Z01419 Encounter for gynecological examination (general) (routine) without abnormal findings: Secondary | ICD-10-CM | POA: Diagnosis not present

## 2021-01-20 DIAGNOSIS — Z124 Encounter for screening for malignant neoplasm of cervix: Secondary | ICD-10-CM | POA: Diagnosis not present

## 2021-01-20 DIAGNOSIS — Z1231 Encounter for screening mammogram for malignant neoplasm of breast: Secondary | ICD-10-CM | POA: Diagnosis not present

## 2021-01-20 DIAGNOSIS — Z113 Encounter for screening for infections with a predominantly sexual mode of transmission: Secondary | ICD-10-CM | POA: Diagnosis not present

## 2021-01-20 LAB — HM PAP SMEAR

## 2021-01-22 ENCOUNTER — Encounter: Payer: Self-pay | Admitting: Internal Medicine

## 2021-01-28 ENCOUNTER — Encounter: Payer: Self-pay | Admitting: Nurse Practitioner

## 2021-01-28 ENCOUNTER — Other Ambulatory Visit: Payer: Self-pay

## 2021-01-28 ENCOUNTER — Ambulatory Visit (INDEPENDENT_AMBULATORY_CARE_PROVIDER_SITE_OTHER): Payer: BC Managed Care – PPO | Admitting: Nurse Practitioner

## 2021-01-28 VITALS — BP 138/78 | HR 80 | Temp 98.6°F | Ht 66.4 in | Wt 197.0 lb

## 2021-01-28 DIAGNOSIS — Z23 Encounter for immunization: Secondary | ICD-10-CM

## 2021-01-28 DIAGNOSIS — Z6831 Body mass index (BMI) 31.0-31.9, adult: Secondary | ICD-10-CM

## 2021-01-28 DIAGNOSIS — E6609 Other obesity due to excess calories: Secondary | ICD-10-CM

## 2021-01-28 DIAGNOSIS — R030 Elevated blood-pressure reading, without diagnosis of hypertension: Secondary | ICD-10-CM

## 2021-01-28 DIAGNOSIS — Z8616 Personal history of COVID-19: Secondary | ICD-10-CM

## 2021-01-28 NOTE — Progress Notes (Signed)
I,Wendy Parks,acting as a Education administrator for [employer], NP.,have documented all relevant documentation on the behalf of [employer], NP,as directed by  Wendy Castilla, NP while in the presence of Wendy Castilla, NP.  This visit occurred during the SARS-CoV-2 public health emergency.  Safety protocols were in place, including screening questions prior to the visit, additional usage of staff PPE, and extensive cleaning of exam room while observing appropriate contact time as indicated for disinfecting solutions.  Subjective:     Patient ID: Wendy Parks , female    DOB: October 26, 1980 , 40 y.o.   MRN: [id number]   Chief Complaint  Patient presents with   Hypertension    HPI  She was in the ED for Covid and her BP was high or elevated but went down without any intervention.  She thinks it is her weight and when she is pain her BP is elevated.  Patient denies SOB and chest pain.   Diet: She is trying to eat healthier.  Exercise:  She is going to start exercise. She is starting soon because she recently had a foot surgery.     Past Medical History:  Diagnosis Date   Kidney stone      Family History  Problem Relation Age of Onset   Healthy Mother    Dementia Father    Cancer Father      Current Outpatient Medications:    albuterol (VENTOLIN HFA) 108 (90 Base) MCG/ACT inhaler, Inhale 2 puffs into the lungs every 6 (six) hours as needed for wheezing or shortness of breath., Disp: 18 g, Rfl: 1   baclofen (LIORESAL) 10 MG tablet, Take 10 mg by mouth 2 (two) times daily as needed., Disp: , Rfl:    benzonatate (TESSALON PERLES) 100 MG capsule, Take 1 capsule (100 mg total) by mouth every 6 (six) hours as needed., Disp: 30 capsule, Rfl: 1   BOTOX 100 units SOLR injection, , Disp: , Rfl:    docusate sodium (COLACE) 100 MG capsule, Take 1 capsule (100 mg total) by mouth 2 (two) times daily., Disp: 10 capsule, Rfl: 0   EMGALITY 120 MG/ML SOAJ, Inject into the skin., Disp: , Rfl:     ibuprofen (ADVIL) 800 MG tablet, Take 1 tablet (800 mg total) by mouth every 8 (eight) hours as needed., Disp: 30 tablet, Rfl: 0   meloxicam (MOBIC) 15 MG tablet, TAKE 1 TABLET BY MOUTH EVERY DAY, Disp: 30 tablet, Rfl: 0   montelukast (SINGULAIR) 10 MG tablet, TAKE 1 TABLET BY MOUTH EVERY DAY, Disp: 90 tablet, Rfl: 1   promethazine (PHENERGAN) 25 MG tablet, Take 1 tablet (25 mg total) by mouth every 8 (eight) hours as needed for nausea or vomiting., Disp: 20 tablet, Rfl: 0   topiramate (TOPAMAX) 200 MG tablet, Take 200 mg by mouth daily., Disp: , Rfl:    No Known Allergies   Review of Systems  Constitutional:  Negative for chills and fever.  HENT:  Negative for congestion, sinus pressure and sinus pain.   Respiratory:  Negative for cough, chest tightness and shortness of breath.   Cardiovascular:  Negative for chest pain and palpitations.  Neurological:  Negative for headaches.    Today's Vitals   01/28/21 0904  BP: 138/78  Pulse: 80  Temp: 98.6 F (37 C)  TempSrc: Oral  Weight: 197 lb (89.4 kg)  Height: 5' 6.4" (1.687 m)   Body mass index is 31.41 kg/m.  Wt Readings from Last 3 Encounters:  01/28/21 197 lb (89.4 kg)  07/01/20 207 lb 9.6 oz (94.2 kg)  06/24/19 192 lb 3.2 oz (87.2 kg)    Objective:  Physical Exam Constitutional:      Appearance: Normal appearance. She is obese.  Cardiovascular:     Rate and Rhythm: Normal rate and regular rhythm.     Pulses: Normal pulses.     Heart sounds: Normal heart sounds. No murmur heard. Skin:    General: Skin is warm and dry.  Neurological:     Mental Status: She is alert.        Assessment And Plan:     1. Elevated blood-pressure reading without diagnosis of hypertension BP today in office was 138/78.  Patient states when she is pain or taking a lot of NSAID's and pain meds her BP increases. She was recently in the ED for COVID and her BP was up but came down without any intervention. She is here for a follow up. Patient  does not want to take med right now as she is taking a lot of other meds. She is going to work on her diet and exercise to see if it will help her BP. -She will send her BP reading for the next 1 week on mychart  -Follow up if her BP is elevated. Patient agrees to the plan.  -Limit the intake of processed foods and salt intake. You should increase your intake of green vegetables and fruits. Limit the use of alcohol. Limit fast foods and fried foods. Avoid high fatty saturated and trans fat foods. Keep yourself hydrated with drinking water. Avoid red meats. Eat lean meats instead. Exercise for atleast 30-45 min for atleast 4-5 times a week.    2. Need for Tdap vaccination - Tdap vaccine greater than or equal to 7yo IM- given today in office.   3. Class 1 obesity due to excess calories without serious comorbidity with body mass index (BMI) of 31.0 to 31.9 in adult Advised patient on a healthy diet including avoiding fast food and red meats. Increase the intake of lean meats including grilled chicken and [place].  Drink a lot of water. Decrease intake of fatty foods. Exercise for 30-45 min. 4-5 a week to decrease the risk of cardiac event.   Follow up: if symptoms persist or do not get better.   The patient was encouraged to call or send a message through [place] for any questions or concerns.   Patient was given opportunity to ask questions. Patient verbalized understanding of the plan and was able to repeat key elements of the plan. All questions were answered to their satisfaction.  Wendy Starlin Steib, DNP   I, Wendy Parks have reviewed all documentation for this visit. The documentation on 12/31/20 for the exam, diagnosis, procedures, and orders are all accurate and complete.     IF YOU HAVE BEEN REFERRED TO A SPECIALIST, IT MAY TAKE 1-2 WEEKS TO SCHEDULE/PROCESS THE REFERRAL. IF YOU HAVE NOT HEARD FROM US/SPECIALIST IN TWO WEEKS, PLEASE GIVE [place] A CALL AT [phone].   THE PATIENT IS  ENCOURAGED TO PRACTICE SOCIAL DISTANCING DUE TO THE COVID-19 PANDEMIC.

## 2021-01-28 NOTE — Patient Instructions (Signed)

## 2021-02-10 DIAGNOSIS — G518 Other disorders of facial nerve: Secondary | ICD-10-CM | POA: Diagnosis not present

## 2021-02-10 DIAGNOSIS — M791 Myalgia, unspecified site: Secondary | ICD-10-CM | POA: Diagnosis not present

## 2021-02-10 DIAGNOSIS — M542 Cervicalgia: Secondary | ICD-10-CM | POA: Diagnosis not present

## 2021-02-10 DIAGNOSIS — G43719 Chronic migraine without aura, intractable, without status migrainosus: Secondary | ICD-10-CM | POA: Diagnosis not present

## 2021-02-18 ENCOUNTER — Other Ambulatory Visit: Payer: Self-pay

## 2021-02-18 ENCOUNTER — Ambulatory Visit (INDEPENDENT_AMBULATORY_CARE_PROVIDER_SITE_OTHER): Payer: BC Managed Care – PPO

## 2021-02-18 ENCOUNTER — Ambulatory Visit (INDEPENDENT_AMBULATORY_CARE_PROVIDER_SITE_OTHER): Payer: BC Managed Care – PPO | Admitting: Sports Medicine

## 2021-02-18 DIAGNOSIS — M21611 Bunion of right foot: Secondary | ICD-10-CM

## 2021-02-18 DIAGNOSIS — M79671 Pain in right foot: Secondary | ICD-10-CM

## 2021-02-18 DIAGNOSIS — Z9889 Other specified postprocedural states: Secondary | ICD-10-CM

## 2021-02-18 NOTE — Progress Notes (Signed)
Subjective: Wendy Parks is a 39 y.o. female patient seen today in office for POV # 5 (DOS 11/17/2019), S/P right [name] bunionectomy.  Patient reports that she is doing good, little swelling occasionally and some pain when she does a lot but otherwise is doing much better, denies headache, chest pain, shortness of breath, nausea, vomiting, fever, or chills. No other issues noted.  Patient Active Problem List   Diagnosis Date Noted   Allergic rhinitis 11/26/2020   Amenorrhea 11/26/2020   Benign essential hypertension 11/26/2020   Body mass index (BMI) 34.0-34.9, adult 11/26/2020   Obesity, unspecified 11/26/2020   Other specified counseling 11/26/2020   Dysuria 11/26/2020   Effusion of hand joint 11/26/2020   Elevated blood-pressure reading without diagnosis of hypertension 11/26/2020   Esophageal reflux 11/26/2020   Gastritis 11/26/2020   Neck pain 11/26/2020   Oligomenorrhea 11/26/2020   Other abnormal glucose 11/26/2020   Pain in joint, pelvic region and thigh 11/26/2020   Patellofemoral syndrome 11/26/2020   Abdominal pain 11/26/2020   Breast pain 11/26/2020   Chest pain 11/26/2020   Pelvic pain 11/26/2020   Pre-operative examination 11/26/2020   Screening for malignant neoplasm of cervix 11/26/2020   Sebaceous cyst 11/26/2020   Sinusitis 11/26/2020   Surgery follow-up examination 11/26/2020   Ureteral stone 11/26/2020   Vaginal discharge 11/26/2020   Nephrolithiasis 06/24/2019   Herpes simplex 12/25/2018   Cyst of ovary 12/14/2015   History of hypertension 12/14/2015   Patellofemoral syndrome of right knee 12/14/2015   Uterine leiomyoma 12/14/2015    Current Outpatient Medications on File Prior to Visit  Medication Sig Dispense Refill   albuterol (VENTOLIN HFA) 108 (90 Base) MCG/ACT inhaler Inhale 2 puffs into the lungs every 6 (six) hours as needed for wheezing or shortness of breath. 18 g 1   baclofen (LIORESAL) 10 MG tablet Take 10 mg by mouth 2 (two) times  daily as needed.     benzonatate (TESSALON PERLES) 100 MG capsule Take 1 capsule (100 mg total) by mouth every 6 (six) hours as needed. 30 capsule 1   BOTOX 100 units SOLR injection      docusate sodium (COLACE) 100 MG capsule Take 1 capsule (100 mg total) by mouth 2 (two) times daily. 10 capsule 0   EMGALITY 120 MG/ML SOAJ Inject into the skin.     ibuprofen (ADVIL) 800 MG tablet Take 1 tablet (800 mg total) by mouth every 8 (eight) hours as needed. 30 tablet 0   meloxicam (MOBIC) 15 MG tablet TAKE 1 TABLET BY MOUTH EVERY DAY 30 tablet 0   montelukast (SINGULAIR) 10 MG tablet TAKE 1 TABLET BY MOUTH EVERY DAY 90 tablet 1   promethazine (PHENERGAN) 25 MG tablet Take 1 tablet (25 mg total) by mouth every 8 (eight) hours as needed for nausea or vomiting. 20 tablet 0   topiramate (TOPAMAX) 200 MG tablet Take 200 mg by mouth daily.     No current facility-administered medications on file prior to visit.    No Known Allergies  Objective: There were no vitals filed for this visit.  General: No acute distress, AAOx3  Right foot: Incision well healed, no gapping or dehiscence at surgical site, mild swelling to right forefoot, no erythema, no warmth, no drainage, no signs of infection noted, Capillary fill time <3 seconds in all digits, gross sensation present via light touch to right foot.  55 dorsiflexion and 10 degrees plantarflexion noted at the right first MPJ with no significant pain.  No  pain with calf compression.   Right foot x-ray hardware intact osteotomy site appears to be healed   Assessment and Plan:  Problem List Items Addressed This Visit   None Visit Diagnoses     Bunion, right foot    -  Primary   Relevant Orders   DG Foot Complete Right (Completed)   S/P foot surgery, right       Right foot pain          -Patient seen and evaluated -Xrays reviewed -May slowly increase activities to tolerance -Encouraged gentle range of motion exercises and scar treatment like  previous -Advised patient to ice and elevate as needed for swelling -Patient to return as needed.  Patient reports that she will come back later in September for surgery consult for her left foot.  Landis Martins, DPM

## 2021-03-03 ENCOUNTER — Other Ambulatory Visit: Payer: Self-pay | Admitting: Sports Medicine

## 2021-03-03 ENCOUNTER — Encounter: Payer: Self-pay | Admitting: Sports Medicine

## 2021-03-03 DIAGNOSIS — Z9889 Other specified postprocedural states: Secondary | ICD-10-CM

## 2021-03-03 MED ORDER — IBUPROFEN 800 MG PO TABS: 800.0000 mg | ORAL_TABLET | Freq: Three times a day (TID) | ORAL | 0 refills | Status: DC | PRN

## 2021-03-03 NOTE — Progress Notes (Signed)
Refill motrin 

## 2021-03-30 ENCOUNTER — Other Ambulatory Visit: Payer: Self-pay | Admitting: Internal Medicine

## 2021-03-31 DIAGNOSIS — G43719 Chronic migraine without aura, intractable, without status migrainosus: Secondary | ICD-10-CM | POA: Diagnosis not present

## 2021-03-31 DIAGNOSIS — M542 Cervicalgia: Secondary | ICD-10-CM | POA: Diagnosis not present

## 2021-03-31 DIAGNOSIS — M791 Myalgia, unspecified site: Secondary | ICD-10-CM | POA: Diagnosis not present

## 2021-03-31 DIAGNOSIS — G518 Other disorders of facial nerve: Secondary | ICD-10-CM | POA: Diagnosis not present

## 2021-05-14 DIAGNOSIS — G43719 Chronic migraine without aura, intractable, without status migrainosus: Secondary | ICD-10-CM | POA: Diagnosis not present

## 2021-05-14 DIAGNOSIS — M791 Myalgia, unspecified site: Secondary | ICD-10-CM | POA: Diagnosis not present

## 2021-05-14 DIAGNOSIS — G518 Other disorders of facial nerve: Secondary | ICD-10-CM | POA: Diagnosis not present

## 2021-05-14 DIAGNOSIS — M542 Cervicalgia: Secondary | ICD-10-CM | POA: Diagnosis not present

## 2021-05-27 ENCOUNTER — Other Ambulatory Visit: Payer: Self-pay

## 2021-05-27 ENCOUNTER — Encounter: Payer: Self-pay | Admitting: Sports Medicine

## 2021-05-27 ENCOUNTER — Ambulatory Visit (INDEPENDENT_AMBULATORY_CARE_PROVIDER_SITE_OTHER): Payer: BC Managed Care – PPO | Admitting: Sports Medicine

## 2021-05-27 DIAGNOSIS — M21611 Bunion of right foot: Secondary | ICD-10-CM

## 2021-05-27 DIAGNOSIS — M79671 Pain in right foot: Secondary | ICD-10-CM | POA: Diagnosis not present

## 2021-05-27 DIAGNOSIS — M779 Enthesopathy, unspecified: Secondary | ICD-10-CM

## 2021-05-27 DIAGNOSIS — Z9889 Other specified postprocedural states: Secondary | ICD-10-CM

## 2021-05-27 NOTE — Progress Notes (Signed)
Subjective: Wendy Parks is a 40 y.o. female patient seen today in office for POV # 6 (DOS 11/17/2019), S/P right [name] bunionectomy.  Patient reports that she is having a little soreness states that she can walk about a mile before she has some soreness at the big toe joint states that she still is experiencing swelling off and on but is a little worried because she has excepted a new job at [employer], [place] and knows that there will be a lot of walking and standing. No other issues noted.  Patient Active Problem List   Diagnosis Date Noted   Allergic rhinitis 11/26/2020   Amenorrhea 11/26/2020   Benign essential hypertension 11/26/2020   Body mass index (BMI) 34.0-34.9, adult 11/26/2020   Obesity, unspecified 11/26/2020   Other specified counseling 11/26/2020   Dysuria 11/26/2020   Effusion of hand joint 11/26/2020   Elevated blood-pressure reading without diagnosis of hypertension 11/26/2020   Esophageal reflux 11/26/2020   Gastritis 11/26/2020   Neck pain 11/26/2020   Oligomenorrhea 11/26/2020   Other abnormal glucose 11/26/2020   Pain in joint, pelvic region and thigh 11/26/2020   Patellofemoral syndrome 11/26/2020   Abdominal pain 11/26/2020   Breast pain 11/26/2020   Chest pain 11/26/2020   Pelvic pain 11/26/2020   Pre-operative examination 11/26/2020   Screening for malignant neoplasm of cervix 11/26/2020   Sebaceous cyst 11/26/2020   Sinusitis 11/26/2020   Surgery follow-up examination 11/26/2020   Ureteral stone 11/26/2020   Vaginal discharge 11/26/2020   Nephrolithiasis 06/24/2019   Herpes simplex 12/25/2018   Fibroadenoma of right breast 06/06/2016   Cyst of ovary 12/14/2015   History of hypertension 12/14/2015   Patellofemoral syndrome of right knee 12/14/2015   Uterine leiomyoma 12/14/2015    Current Outpatient Medications on File Prior to Visit  Medication Sig Dispense Refill   albuterol (VENTOLIN HFA) 108 (90 Base) MCG/ACT inhaler Inhale 2 puffs into  the lungs every 6 (six) hours as needed for wheezing or shortness of breath. 18 g 1   baclofen (LIORESAL) 10 MG tablet Take 10 mg by mouth 2 (two) times daily as needed.     benzonatate (TESSALON PERLES) 100 MG capsule Take 1 capsule (100 mg total) by mouth every 6 (six) hours as needed. 30 capsule 1   BOTOX 100 units SOLR injection      docusate sodium (COLACE) 100 MG capsule Take 1 capsule (100 mg total) by mouth 2 (two) times daily. 10 capsule 0   EMGALITY 120 MG/ML SOAJ Inject into the skin.     Erenumab-aooe (AIMOVIG) 140 MG/ML SOAJ Aimovig Autoinjector 140 mg/mL subcutaneous auto-injector  USE ONE 140 MG SUBCUTANEOUSLY AUTOINJECTOR ONCE MONTHLY FOR MIGRAINE PREVENTION     HYDROcodone-acetaminophen (NORCO) 10-325 MG tablet hydrocodone 10 mg-acetaminophen 325 mg tablet  TAKE 1 TABLET BY MOUTH EVERY 6 HOURS AS NEEDED FOR UP TO 7 DAYS.     ibuprofen (ADVIL) 800 MG tablet Take 1 tablet (800 mg total) by mouth every 8 (eight) hours as needed. 30 tablet 0   meloxicam (MOBIC) 15 MG tablet TAKE 1 TABLET BY MOUTH EVERY DAY 30 tablet 0   montelukast (SINGULAIR) 10 MG tablet TAKE 1 TABLET BY MOUTH EVERY DAY 90 tablet 1   phentermine (ADIPEX-P) 37.5 MG tablet phentermine 37.5 mg tablet  TAKE 1 TABLET BY MOUTH ONCE DAILY     promethazine (PHENERGAN) 25 MG tablet Take 1 tablet (25 mg total) by mouth every 8 (eight) hours as needed for nausea or vomiting. Advance  tablet 0   tinidazole (TINDAMAX) 500 MG tablet tinidazole 500 mg tablet  TAKE 4 TABLETS EVERY DAY BY ORAL ROUTE FOR 2 DAYS.     topiramate (TOPAMAX) 200 MG tablet Take 200 mg by mouth daily.     topiramate ER (QUDEXY XR) 200 MG CS24 sprinkle capsule Qudexy XR 200 mg capsule sprinkle,extended release  TAKE 1 CAPSULE BY MOUTH EVERY DAY     No current facility-administered medications on file prior to visit.    No Known Allergies  Objective: There were no vitals filed for this visit.  General: No acute distress, AAOx3  Right foot: Incision well  healed, no gapping or dehiscence at surgical site, mild swelling to right forefoot, no erythema, no warmth, no drainage, no signs of infection noted, Capillary fill time <3 seconds in all digits, gross sensation present via light touch to right foot.  50 dorsiflexion and 10 degrees plantarflexion noted at the right first MPJ with no significant pain however this range of motion is slightly less than before.  No pain with calf compression.   Assessment and Plan:  Problem List Items Addressed This Visit   None Visit Diagnoses     Bunion, right foot    -  Primary   Capsulitis       S/P foot surgery, right       Relevant Orders   Ambulatory referral to Physical Therapy   Right foot pain          -Patient seen and evaluated -Encouraged again gentle range of motion exercises and scar treatment like previous however since patient is still struggling with range of motion and prescribed physical therapy at [facility] patient to ice and elevate as needed for swelling -Continue with compression as needed for swelling -Continue with good supportive shoes especially now since patient has a new job working in [employer], [place] -Patient to return as scheduled in 2 months we will plan to do new x-rays of the right foot at next visit. Landis Martins, DPM

## 2021-06-04 ENCOUNTER — Ambulatory Visit: Payer: BC Managed Care – PPO | Attending: Sports Medicine

## 2021-06-04 ENCOUNTER — Other Ambulatory Visit: Payer: Self-pay

## 2021-06-04 DIAGNOSIS — M6281 Muscle weakness (generalized): Secondary | ICD-10-CM | POA: Diagnosis not present

## 2021-06-04 DIAGNOSIS — R262 Difficulty in walking, not elsewhere classified: Secondary | ICD-10-CM | POA: Diagnosis not present

## 2021-06-04 DIAGNOSIS — R2689 Other abnormalities of gait and mobility: Secondary | ICD-10-CM

## 2021-06-04 DIAGNOSIS — Z9889 Other specified postprocedural states: Secondary | ICD-10-CM | POA: Diagnosis not present

## 2021-06-04 DIAGNOSIS — R2681 Unsteadiness on feet: Secondary | ICD-10-CM

## 2021-06-04 NOTE — Patient Instructions (Signed)
  [id number]

## 2021-06-04 NOTE — Therapy (Signed)
[place], [place], [address] Phone: [phone]   Fax:  [phone]  Physical Therapy Evaluation  Patient Details  Name: Wendy Parks MRN: [id number] Date of Birth: 1981-05-15 Referring Provider (PT): Landis Martins, [place]   Encounter Date: 06/04/2021   PT End of Session - 06/04/21 1214     Visit Number 1    Number of Visits 9    Date for PT Re-Evaluation 07/30/21    Authorization Type BCBS    Authorization Time Period FOTO v6, v10    PT Start Time 1130    PT Stop Time 1215    PT Time Calculation (min) 45 min    Activity Tolerance Patient tolerated treatment well    Behavior During Therapy  [facility] for tasks assessed/performed             Past Medical History:  Diagnosis Date   Kidney stone     Past Surgical History:  Procedure Laterality Date   BUNIONECTOMY  11/16/2020   ENDOMETRIAL ABLATION  01/2019   Dr. Mancel Bale   KIDNEY STONE SURGERY     TUBAL LIGATION  2012    There were no vitals filed for this visit.    Subjective Assessment - 06/04/21 1127     Subjective Pt reports s/p Rt Austin Akin bunionectomy on 11/16/2020. She reports that her remaining impairments include decreased R great toe AROM and swelling at her incision site and lateral ankle. She reports being able to walk about 1 mile before the swelling prevents her form doing more. She has not attempting running yet. She states that once the swelling increases, it remains that way for about a week. She also reports that she still sleeps with a DF night splint and uses compression stocking to help alleviate the swelling. She has performed marble pick-ups and tennis ball roll-outs at home for exercises. She denies any nausea/ vomiting, N/T, changes in bowel/ bladder changes. However, she does report increased night pain which can wake her up at night. Additionally, she reports that she occasionally experiences a "charlie horse" sensation in  her R great toe when sleeping, which requires her to manually "pop it back."    Pertinent History Rt [name] bunionectomy 11/16/2020    Limitations Walking;Standing;House hold activities    How long can you sit comfortably? Unlimited    How long can you stand comfortably? 15 minute    How long can you walk comfortably? 15 minutes    Patient Stated Goals Walk/ run for exercise, household chores    Currently in Pain? Yes    Pain Score 2     Pain Location Toe (Comment which one)   Great toe   Pain Orientation Right    Pain Descriptors / Indicators Aching;Sharp;Radiating    Pain Type Chronic pain;Surgical pain    Pain Radiating Towards up foot to lateral ankle/ heel    Pain Onset More than a month ago    Pain Frequency Intermittent    Aggravating Factors  walking, standing, moving great toe    Pain Relieving Factors rest, pain medication, ice    Effect of Pain on Daily Activities See pt goals                [facility] PT Assessment - 06/04/21 0001       Assessment   Medical Diagnosis S/P foot surgery, right (M27.078)    Referring Provider (PT) Landis Martins, DPM    Onset Date/Surgical Date 11/16/20  Hand Dominance Right    Next MD Visit Unknown    Prior Therapy Yes for knee about 9 years ago      Precautions   Precautions None      Restrictions   Weight Bearing Restrictions No      Balance Screen   Has the patient fallen in the past 6 months No    Has the patient had a decrease in activity level because of a fear of falling?  No    Is the patient reluctant to leave their home because of a fear of falling?  No      Home Social worker Private residence    Living Arrangements Spouse/significant other;Children    Available Help at Discharge Family;Friend(s)    Type of [place] to enter    Entrance Stairs-Rails Right;Left;Can reach both    [place] Two level    Alternate Level Stairs-Number of Steps 12    Alternate Level  Stairs-Rails Right;Left;Can reach both    Home Equipment None      Prior Function   Level of Independence Independent    Vocation Full time employment    Scientist, research (physical sciences)    Leisure Walking      Cognition   Overall Cognitive Status Within Functional Limits for tasks assessed      Observation/Other Assessments   Focus on Therapeutic Outcomes (FOTO)  52%, projected 71% in 15 visits      Observation/Other Assessments-Edema    Edema Figure 8      Figure 8 Edema   Figure 8 - Right  56cm    Figure 8 - Left  54cm      Functional Tests   Functional tests Squat;Single leg stance      Squat   Comments Pain at R great toe      Single Leg Stance   Comments L: WNL; R: 3 step errors in 15 seconds with R great toe pain      AROM   Overall AROM Comments L toe extension: 46; R toe extension: 35    Right Ankle Dorsiflexion 3    Right Ankle Plantar Flexion 44    Right Ankle Inversion 25    Right Ankle Eversion 8    Left Ankle Dorsiflexion 3    Left Ankle Plantar Flexion 44    Left Ankle Inversion 33    Left Ankle Eversion 15      PROM   Overall PROM Comments L toe extension: 65; R toe extension: 55 with pain      Strength   Overall Strength Comments BIL great toe flexion/ extension: 5/5    Right Hip Flexion 5/5    Left Hip Flexion 5/5    Right Knee Flexion 5/5    Right Knee Extension 5/5    Left Knee Flexion 5/5    Left Knee Extension 5/5    Right Ankle Dorsiflexion 4+/5   pain over incision   Right Ankle Plantar Flexion 5/5    Right Ankle Inversion 5/5    Right Ankle Eversion 5/5    Left Ankle Dorsiflexion 5/5    Left Ankle Plantar Flexion 5/5    Left Ankle Inversion 5/5    Left Ankle Eversion 5/5      Flexibility   Soft Tissue Assessment /Muscle Length yes   Moderately limited BIL gastrocnemius and soleus     Palpation   Palpation comment TTP to surgical  incision on R foot                        Objective measurements  completed on examination: See above findings.                PT Education - 06/04/21 1214     Education Details Discussed POC, underlying pathophysiology, and proper form with HEP    Person(s) Educated Patient    Methods Explanation;Demonstration;Handout    Comprehension Verbalized understanding;Returned demonstration              PT Short Term Goals - 06/04/21 1315       PT SHORT TERM GOAL #1   Title Pt will report understanding and adherence to her HEP in order to promote independence in the management of her primary sxs.    Baseline HEP provided at eval    Time 4    Period Weeks    Status New    Target Date 07/02/21               PT Long Term Goals - 06/04/21 1316       PT LONG TERM GOAL #1   Title Pt will achieve a FOT score of 71% in order to demonstrate improved functional ability as it relates to her great toe pain.    Baseline 52%    Time 8    Period Weeks    Status New    Target Date 07/30/21      PT LONG TERM GOAL #2   Title Pt will achieve R great toe extension AROM of 45 degrees or greater in order to promote WNL gait with decreased pain.    Baseline 35 degrees    Time 8    Period Weeks    Status New    Target Date 07/30/21      PT LONG TERM GOAL #3   Title Pt will report ability to walk 30 minutes with 0-1/10 pain in order to return to walking for exercise without limitation due to pain.    Baseline Pt reports >4/10 pain after 15 minutes of walking    Time 8    Period Weeks    Status New    Target Date 07/30/21      PT LONG TERM GOAL #4   Title Pt will achieve 10 full-depth squats with 0-1/10 pain in order to progress her independent LE strengthening program without limitation.    Baseline >4/10 pain with 1 squat    Time 8    Period Weeks    Status New    Target Date 07/30/21      PT LONG TERM GOAL #5   Title Pt will achieve R single leg stance of 15 seconds or longer in order to promote functional balance.    Baseline 3  step errors in 15 seconds    Time 8    Period Weeks    Status New    Target Date 07/30/21                    Plan - 06/04/21 1215     Clinical Impression Statement Pt is a pleasant 40yo F who is over 6 months s/p R Austin Akin bunionectomy on 11/16/2020 with primary c/o R great toe stiffness, pain, and swelling. Upon assessment, her primary impairments include R foot/ ankle swelling, TTP to R surgical incision, limited R great toe extension AROM, limited and painful R great  toe extension PROM, pain with squatting, poor single leg balance on the R, limited and painful R ankle DF MMT, and moderately limited BIL gastrocnemius and soleus extensibility. She will benefit from skilled PT to address these impairments and return to her prior level of function without limitation.    Personal Factors and Comorbidities Time since onset of injury/illness/exacerbation    Examination-Activity Limitations Sleep;Squat;Stairs;Stand;Locomotion Level    Examination-Participation Restrictions Community Activity;Shop    Stability/Clinical Decision Making Evolving/Moderate complexity    Clinical Decision Making Moderate    Rehab Potential Good    PT Frequency 1x / week    PT Duration 8 weeks    PT Treatment/Interventions ADLs/Self Care Home Management;Cryotherapy;Gait training;Stair training;Functional mobility training;Therapeutic activities;Therapeutic exercise;Balance training;Neuromuscular re-education;Manual techniques;Passive range of motion;Scar mobilization;Compression bandaging;Patient/family education;Taping;Vasopneumatic Device;Manual lymph drainage    PT Next Visit Plan Progress closed-chain ankle/ great toe exercises    PT Home Exercise Plan [id number]    Consulted and Agree with Plan of Care Patient             Patient will benefit from skilled therapeutic intervention in order to improve the following deficits and impairments:  Difficulty walking, Decreased range of motion, Pain,  Impaired flexibility, Decreased mobility, Decreased strength, Increased edema  Visit Diagnosis: Difficulty in walking, not elsewhere classified  Muscle weakness (generalized)  Other abnormalities of gait and mobility  Unsteadiness on feet     Problem List Patient Active Problem List   Diagnosis Date Noted   Allergic rhinitis 11/26/2020   Amenorrhea 11/26/2020   Benign essential hypertension 11/26/2020   Body mass index (BMI) 34.0-34.9, adult 11/26/2020   Obesity, unspecified 11/26/2020   Other specified counseling 11/26/2020   Dysuria 11/26/2020   Effusion of hand joint 11/26/2020   Elevated blood-pressure reading without diagnosis of hypertension 11/26/2020   Esophageal reflux 11/26/2020   Gastritis 11/26/2020   Neck pain 11/26/2020   Oligomenorrhea 11/26/2020   Other abnormal glucose 11/26/2020   Pain in joint, pelvic region and thigh 11/26/2020   Patellofemoral syndrome 11/26/2020   Abdominal pain 11/26/2020   Breast pain 11/26/2020   Chest pain 11/26/2020   Pelvic pain 11/26/2020   Pre-operative examination 11/26/2020   Screening for malignant neoplasm of cervix 11/26/2020   Sebaceous cyst 11/26/2020   Sinusitis 11/26/2020   Surgery follow-up examination 11/26/2020   Ureteral stone 11/26/2020   Vaginal discharge 11/26/2020   Nephrolithiasis 06/24/2019   Herpes simplex 12/25/2018   Fibroadenoma of right breast 06/06/2016   Cyst of ovary 12/14/2015   History of hypertension 12/14/2015   Patellofemoral syndrome of right knee 12/14/2015   Uterine leiomyoma 12/14/2015    Vanessa Tamora, PT, DPT 06/04/21 1:22 PM   [facility] [address] [place], [place], [address] Phone: [phone]   Fax:  [phone]  Name: Odessa Nishi MRN: [id number] Date of Birth: [birth year]

## 2021-06-25 ENCOUNTER — Ambulatory Visit: Payer: BC Managed Care – PPO | Admitting: Physical Therapy

## 2021-06-29 ENCOUNTER — Ambulatory Visit: Payer: BC Managed Care – PPO | Attending: Sports Medicine

## 2021-06-29 ENCOUNTER — Other Ambulatory Visit: Payer: Self-pay

## 2021-06-29 DIAGNOSIS — R262 Difficulty in walking, not elsewhere classified: Secondary | ICD-10-CM | POA: Insufficient documentation

## 2021-06-29 DIAGNOSIS — R2689 Other abnormalities of gait and mobility: Secondary | ICD-10-CM | POA: Diagnosis not present

## 2021-06-29 DIAGNOSIS — M6281 Muscle weakness (generalized): Secondary | ICD-10-CM | POA: Insufficient documentation

## 2021-06-29 DIAGNOSIS — R2681 Unsteadiness on feet: Secondary | ICD-10-CM | POA: Insufficient documentation

## 2021-06-29 NOTE — Therapy (Addendum)
[place], [place], [address] Phone: [phone]   Fax:  [phone]  Physical Therapy Treatment  Patient Details  Name: Wendy Parks MRN: [id number] Date of Birth: 1980-12-19 Referring Provider (PT): Landis Martins, [place]   Encounter Date: 06/29/2021   PT End of Session - 06/29/21 1848     Visit Number 2    Number of Visits 9    Date for PT Re-Evaluation 07/30/21    Authorization Type BCBS    Authorization Time Period FOTO v6, v10    PT Start Time 1700    PT Stop Time 1758   15 minutes for gameready   PT Time Calculation (min) 58 min    Activity Tolerance Patient tolerated treatment well    Behavior During Therapy [facility] for tasks assessed/performed             Past Medical History:  Diagnosis Date   Kidney stone     Past Surgical History:  Procedure Laterality Date   BUNIONECTOMY  11/16/2020   ENDOMETRIAL ABLATION  01/2019   Dr. Mancel Bale   KIDNEY STONE SURGERY     TUBAL LIGATION  2012    There were no vitals filed for this visit.   Subjective Assessment - 06/29/21 1652     Subjective Pt says she has been doing her HEP and believes it has helped her pain. Pt still has pain 2/10 that is constant and "aching". Pt says she started a new part time job which has increased her pain and swelling in Rt foot due to being on her feet more. Pt says she has continued to wear compression socks at night and elevates her feet to decrease swelling.    Pertinent History Rt [name] bunionectomy 11/16/2020    Limitations Walking;Standing;House hold activities    How long can you sit comfortably? Unlimited    How long can you stand comfortably? 15 minute    How long can you walk comfortably? 15 minutes    Patient Stated Goals Walk/ run for exercise, household chores    Currently in Pain? Yes    Pain Score 2     Pain Location Toe (Comment which one)   Right hallux   Pain Orientation Right    Pain  Descriptors / Indicators Aching    Pain Type Surgical pain;Chronic pain    Pain Onset More than a month ago    Pain Frequency Constant             [facility] Adult PT Treatment/Exercise:  Therapeutic Exercise: - bilat seated heel raises 3x10 - toe scrunches Rt foot only, 3x30secs - calf stretch on wall 3x30s each leg - toe stretch against wall 3x30s Rt only  Manual Therapy: - PROM of Rt toe, foot, and ankle in all directions  - soft tissue massage of dorsum and plantar aspect of the Rt foot  - Joint mobilizations along 1st metatarsal and midfoot into flexion and extension of Rt great toe.   Neuromuscular re-ed: - NA  Therapeutic Activity: - NA  Self-care/Home Management: - see patient education   Modalities - Gameready ankle elevated 15 minutes    [facility] PT Assessment - 06/29/21 0001       Assessment   Next MD Visit December      AROM   Overall AROM Comments Rt toe extension 42   pain at end range     PROM   Overall PROM Comments Rt toe extension 62  Palpation   Palpation comment Visable and palpable edema, joint stiffness/tightness in Rt 1st metatarsal joint. reports pain with palpation around incision site .                           [facility] Adult PT Treatment/Exercise - 06/29/21 0001       Modalities   Modalities Vasopneumatic      Vasopneumatic   Number Minutes Vasopneumatic  15 minutes    Vasopnuematic Location  Ankle    Vasopneumatic Pressure Medium    Vasopneumatic Temperature  38                     PT Education - 06/29/21 1847     Education Details Pt given HEP handout and instructed on new exercises to incorporate into her routine.    Person(s) Educated Patient    Methods Handout;Explanation;Demonstration    Comprehension Verbalized understanding;Returned demonstration              PT Short Term Goals - 06/29/21 1859       PT SHORT TERM GOAL #1   Title Pt will report understanding and adherence to her  HEP in order to promote independence in the management of her primary sxs.    Baseline HEP provided at eval    Time 4    Period Weeks    Status Achieved    Target Date 07/02/21               PT Long Term Goals - 06/04/21 1316       PT LONG TERM GOAL #1   Title Pt will achieve a FOT score of 71% in order to demonstrate improved functional ability as it relates to her great toe pain.    Baseline 52%    Time 8    Period Weeks    Status New    Target Date 07/30/21      PT LONG TERM GOAL #2   Title Pt will achieve R great toe extension AROM of 45 degrees or greater in order to promote WNL gait with decreased pain.    Baseline 35 degrees    Time 8    Period Weeks    Status New    Target Date 07/30/21      PT LONG TERM GOAL #3   Title Pt will report ability to walk 30 minutes with 0-1/10 pain in order to return to walking for exercise without limitation due to pain.    Baseline Pt reports >4/10 pain after 15 minutes of walking    Time 8    Period Weeks    Status New    Target Date 07/30/21      PT LONG TERM GOAL #4   Title Pt will achieve 10 full-depth squats with 0-1/10 pain in order to progress her independent LE strengthening program without limitation.    Baseline >4/10 pain with 1 squat    Time 8    Period Weeks    Status New    Target Date 07/30/21      PT LONG TERM GOAL #5   Title Pt will achieve R single leg stance of 15 seconds or longer in order to promote functional balance.    Baseline 3 step errors in 15 seconds    Time 8    Period Weeks    Status New    Target Date 07/30/21  Plan - 06/29/21 1850     Clinical Impression Statement Patient presents with palpable and visual edema in dorsum of Rt foot in the Rt great toe with reported tenderness in these areas. Decreased palpable tightness/ stiffness in 1st metatarsal joint noted following PROM, soft tissue massage, and joint mobilizations with minor reports of increase pain to  4/10 during manual therapy. Patient gave good effort with all activities. Able to progress Rt 1st metatarsal extension AROM and PROM today with minimal reported increased pain at end range, see flowsheet. Pt reported fatigue in her Rt foot and toe following 5 repititions of toe scrunches. Pt able to tolerate seated heel raises, toe scrunches, and calf stretch with no reported increase in pain. Cues given throughout all activities per procedure list for proper form and technique. Pt met short term goal by being able to demonstrate adherence and independance of HEP. Decreased soreness reported with use of Gameready on ankle at the end of session - skin WNL after use of modalities.    Examination-Activity Limitations Sleep;Squat;Stairs;Stand;Locomotion Level    Examination-Participation Restrictions Community Activity;Shop    Rehab Potential Good    PT Frequency 1x / week    PT Duration 8 weeks    PT Treatment/Interventions ADLs/Self Care Home Management;Cryotherapy;Gait training;Stair training;Functional mobility training;Therapeutic activities;Therapeutic exercise;Balance training;Neuromuscular re-education;Manual techniques;Passive range of motion;Scar mobilization;Compression bandaging;Patient/family education;Taping;Vasopneumatic Device;Manual lymph drainage    PT Next Visit Plan Progress closed-chain ankle/ great toe exercises; Assess PROM and AROM of flexion and extension of Rt great toe; Assess response to manual therapy.    PT Home Exercise Plan [id number]    Consulted and Agree with Plan of Care Patient             Patient will benefit from skilled therapeutic intervention in order to improve the following deficits and impairments:  Difficulty walking, Decreased range of motion, Pain, Impaired flexibility, Decreased mobility, Decreased strength, Increased edema  Visit Diagnosis: No diagnosis found.     Problem List Patient Active Problem List   Diagnosis Date Noted   Allergic  rhinitis 11/26/2020   Amenorrhea 11/26/2020   Benign essential hypertension 11/26/2020   Body mass index (BMI) 34.0-34.9, adult 11/26/2020   Obesity, unspecified 11/26/2020   Other specified counseling 11/26/2020   Dysuria 11/26/2020   Effusion of hand joint 11/26/2020   Elevated blood-pressure reading without diagnosis of hypertension 11/26/2020   Esophageal reflux 11/26/2020   Gastritis 11/26/2020   Neck pain 11/26/2020   Oligomenorrhea 11/26/2020   Other abnormal glucose 11/26/2020   Pain in joint, pelvic region and thigh 11/26/2020   Patellofemoral syndrome 11/26/2020   Abdominal pain 11/26/2020   Breast pain 11/26/2020   Chest pain 11/26/2020   Pelvic pain 11/26/2020   Pre-operative examination 11/26/2020   Screening for malignant neoplasm of cervix 11/26/2020   Sebaceous cyst 11/26/2020   Sinusitis 11/26/2020   Surgery follow-up examination 11/26/2020   Ureteral stone 11/26/2020   Vaginal discharge 11/26/2020   Nephrolithiasis 06/24/2019   Herpes simplex 12/25/2018   Fibroadenoma of right breast 06/06/2016   Cyst of ovary 12/14/2015   History of hypertension 12/14/2015   Patellofemoral syndrome of right knee 12/14/2015   Uterine leiomyoma 12/14/2015   Glade Lloyd, SPT 06/29/21 7:10 PM   [facility] [facility] [address] [place], [place], [address] Phone: [phone]   Fax:  [phone]  Name: Wendy Parks MRN: [id number] Date of Birth: [birth year]

## 2021-07-07 DIAGNOSIS — M542 Cervicalgia: Secondary | ICD-10-CM | POA: Diagnosis not present

## 2021-07-07 DIAGNOSIS — M791 Myalgia, unspecified site: Secondary | ICD-10-CM | POA: Diagnosis not present

## 2021-07-07 DIAGNOSIS — G518 Other disorders of facial nerve: Secondary | ICD-10-CM | POA: Diagnosis not present

## 2021-07-07 DIAGNOSIS — G43719 Chronic migraine without aura, intractable, without status migrainosus: Secondary | ICD-10-CM | POA: Diagnosis not present

## 2021-07-09 ENCOUNTER — Ambulatory Visit: Payer: BC Managed Care – PPO | Admitting: Physical Therapy

## 2021-07-09 ENCOUNTER — Other Ambulatory Visit: Payer: Self-pay

## 2021-07-09 DIAGNOSIS — M6281 Muscle weakness (generalized): Secondary | ICD-10-CM

## 2021-07-09 DIAGNOSIS — R2681 Unsteadiness on feet: Secondary | ICD-10-CM | POA: Diagnosis not present

## 2021-07-09 DIAGNOSIS — R262 Difficulty in walking, not elsewhere classified: Secondary | ICD-10-CM | POA: Diagnosis not present

## 2021-07-09 DIAGNOSIS — R2689 Other abnormalities of gait and mobility: Secondary | ICD-10-CM

## 2021-07-09 NOTE — Therapy (Signed)
[place], [place], [address] Phone: [phone]   Fax:  [phone]  Physical Therapy Treatment  Patient Details  Name: Wendy Parks MRN: [id number] Date of Birth: 1980-09-25 Referring Provider (PT): Landis Martins, [place]   Encounter Date: 07/09/2021   PT End of Session - 07/09/21 1154     Visit Number 3    Number of Visits 9    Date for PT Re-Evaluation 07/30/21    Authorization Type BC[employer]    Authorization Time Period FOTO v6, v10    PT Start Time 1150    PT Stop Time 1245    PT Time Calculation (min) 55 min             Past Medical History:  Diagnosis Date   Kidney stone     Past Surgical History:  Procedure Laterality Date   BUNIONECTOMY  11/16/2020   ENDOMETRIAL ABLATION  01/2019   Dr. Mancel Bale   KIDNEY STONE SURGERY     TUBAL LIGATION  2012    There were no vitals filed for this visit.   [facility] Adult PT Treatment/Exercise:  Therapeutic Exercise: - right seated heel raises 3x10 - toe scrunches Rt foot only, 3x30secs - calf stretch at wall 3x30s each leg - toe stretch against wall 3x30s Rt only -sit-stand x 5 barefoot- increased pain, shoes donned x 10- no pain -SLS able to SLS >45 sec however is lifting great toe to avoid pressure  Manual Therapy: - PROM of Rt toe, foot, and ankle in all directions  - soft tissue massage of dorsum and plantar aspect of the Rt foot  - Joint mobilizations along 1st metatarsal and midfoot into flexion and extension of Rt great toe.   Neuromuscular re-ed: - NA  Therapeutic Activity: - NA  Self-care/Home Management: - see patient education   Modalities - Gameready ankle elevated 15 minutes                PT Short Term Goals - 06/29/21 1859       PT SHORT TERM GOAL #1   Title Pt will report understanding and adherence to her HEP in order to promote independence in the management of her primary sxs.    Baseline HEP provided  at eval    Time 4    Period Weeks    Status Achieved    Target Date 07/02/21               PT Long Term Goals - 06/04/21 1316       PT LONG TERM GOAL #1   Title Pt will achieve a FOT score of 71% in order to demonstrate improved functional ability as it relates to her great toe pain.    Baseline 52%    Time 8    Period Weeks    Status New    Target Date 07/30/21      PT LONG TERM GOAL #2   Title Pt will achieve R great toe extension AROM of 45 degrees or greater in order to promote WNL gait with decreased pain.    Baseline 35 degrees    Time 8    Period Weeks    Status New    Target Date 07/30/21      PT LONG TERM GOAL #3   Title Pt will report ability to walk 30 minutes with 0-1/10 pain in order to return to walking for exercise without limitation due to pain.    Baseline Pt  reports >4/10 pain after 15 minutes of walking    Time 8    Period Weeks    Status New    Target Date 07/30/21      PT LONG TERM GOAL #4   Title Pt will achieve 10 full-depth squats with 0-1/10 pain in order to progress her independent LE strengthening program without limitation.    Baseline >4/10 pain with 1 squat    Time 8    Period Weeks    Status New    Target Date 07/30/21      PT LONG TERM GOAL #5   Title Pt will achieve R single leg stance of 15 seconds or longer in order to promote functional balance.    Baseline 3 step errors in 15 seconds    Time 8    Period Weeks    Status New    Target Date 07/30/21                   Plan - 07/09/21 1155     Clinical Impression Statement Pt arrives reporting 2/10 pain. She wore compression stcocking last night and notes decreased edema this AM. She has been working on PROM of her toe and feels improvement.  Progressed with closed chain challenges. Increased pain with barefoot verses no pain in shoes. Her HEP was progressed however she was cautioned to avoid over doing it and continue ice and elevation.    PT  Treatment/Interventions ADLs/Self Care Home Management;Cryotherapy;Gait training;Stair training;Functional mobility training;Therapeutic activities;Therapeutic exercise;Balance training;Neuromuscular re-education;Manual techniques;Passive range of motion;Scar mobilization;Compression bandaging;Patient/family education;Taping;Vasopneumatic Device;Manual lymph drainage    PT Next Visit Plan Progress closed-chain ankle/ great toe exercises (assess new HEP); Assess PROM and AROM of flexion and extension of Rt great toe;    PT Home Exercise Plan [phone]             Patient will benefit from skilled therapeutic intervention in order to improve the following deficits and impairments:  Difficulty walking, Decreased range of motion, Pain, Impaired flexibility, Decreased mobility, Decreased strength, Increased edema  Visit Diagnosis: Difficulty in walking, not elsewhere classified  Muscle weakness (generalized)  Other abnormalities of gait and mobility  Unsteadiness on feet     Problem List Patient Active Problem List   Diagnosis Date Noted   Allergic rhinitis 11/26/2020   Amenorrhea 11/26/2020   Benign essential hypertension 11/26/2020   Body mass index (BMI) 34.0-34.9, adult 11/26/2020   Obesity, unspecified 11/26/2020   Other specified counseling 11/26/2020   Dysuria 11/26/2020   Effusion of hand joint 11/26/2020   Elevated blood-pressure reading without diagnosis of hypertension 11/26/2020   Esophageal reflux 11/26/2020   Gastritis 11/26/2020   Neck pain 11/26/2020   Oligomenorrhea 11/26/2020   Other abnormal glucose 11/26/2020   Pain in joint, pelvic region and thigh 11/26/2020   Patellofemoral syndrome 11/26/2020   Abdominal pain 11/26/2020   Breast pain 11/26/2020   Chest pain 11/26/2020   Pelvic pain 11/26/2020   Pre-operative examination 11/26/2020   Screening for malignant neoplasm of cervix 11/26/2020   Sebaceous cyst 11/26/2020   Sinusitis 11/26/2020   Surgery  follow-up examination 11/26/2020   Ureteral stone 11/26/2020   Vaginal discharge 11/26/2020   Nephrolithiasis 06/24/2019   Herpes simplex 12/25/2018   Fibroadenoma of right breast 06/06/2016   Cyst of ovary 12/14/2015   History of hypertension 12/14/2015   Patellofemoral syndrome of right knee 12/14/2015   Uterine leiomyoma 12/14/2015    Dorene Ar, PTA 07/09/2021, 2:03 PM  [facility] [address] [place], [place], [address] Phone: [phone]   Fax:  [phone]  Name: Wendy Parks MRN: [id number] Date of Birth: 12-04-1980

## 2021-07-12 ENCOUNTER — Ambulatory Visit (INDEPENDENT_AMBULATORY_CARE_PROVIDER_SITE_OTHER): Payer: BC Managed Care – PPO | Admitting: Internal Medicine

## 2021-07-12 ENCOUNTER — Other Ambulatory Visit: Payer: Self-pay

## 2021-07-12 ENCOUNTER — Encounter: Payer: Self-pay | Admitting: Internal Medicine

## 2021-07-12 VITALS — BP 128/68 | HR 80 | Temp 98.6°F | Ht 65.8 in | Wt 203.4 lb

## 2021-07-12 DIAGNOSIS — E6609 Other obesity due to excess calories: Secondary | ICD-10-CM

## 2021-07-12 DIAGNOSIS — Z0000 Encounter for general adult medical examination without abnormal findings: Secondary | ICD-10-CM

## 2021-07-12 DIAGNOSIS — Z0001 Encounter for general adult medical examination with abnormal findings: Secondary | ICD-10-CM

## 2021-07-12 DIAGNOSIS — M545 Low back pain, unspecified: Secondary | ICD-10-CM | POA: Diagnosis not present

## 2021-07-12 DIAGNOSIS — Z6833 Body mass index (BMI) 33.0-33.9, adult: Secondary | ICD-10-CM | POA: Diagnosis not present

## 2021-07-12 DIAGNOSIS — G8929 Other chronic pain: Secondary | ICD-10-CM

## 2021-07-12 DIAGNOSIS — R7309 Other abnormal glucose: Secondary | ICD-10-CM | POA: Diagnosis not present

## 2021-07-12 LAB — POCT URINALYSIS DIPSTICK
Bilirubin, UA: NEGATIVE
Glucose, UA: NEGATIVE
Ketones, UA: NEGATIVE
Leukocytes, UA: NEGATIVE
Nitrite, UA: NEGATIVE
Protein, UA: NEGATIVE
Spec Grav, UA: 1.025 (ref 1.010–1.025)
Urobilinogen, UA: 0.2 E.U./dL
pH, UA: 7 (ref 5.0–8.0)

## 2021-07-12 LAB — CMP14+EGFR
ALT: 12 IU/L (ref 0–32)
AST: 19 IU/L (ref 0–40)
Albumin/Globulin Ratio: 1.5 (ref 1.2–2.2)
Albumin: 4.5 g/dL (ref 3.8–4.8)
Alkaline Phosphatase: 105 IU/L (ref 44–121)
BUN/Creatinine Ratio: 15 (ref 9–23)
BUN: 12 mg/dL (ref 6–24)
Bilirubin Total: 0.3 mg/dL (ref 0.0–1.2)
CO2: 24 mmol/L (ref 20–29)
Calcium: 9 mg/dL (ref 8.7–10.2)
Chloride: 106 mmol/L (ref 96–106)
Creatinine, Ser: 0.82 mg/dL (ref 0.57–1.00)
Globulin, Total: 3.1 g/dL (ref 1.5–4.5)
Glucose: 86 mg/dL (ref 70–99)
Potassium: 3.9 mmol/L (ref 3.5–5.2)
Sodium: 141 mmol/L (ref 134–144)
Total Protein: 7.6 g/dL (ref 6.0–8.5)
eGFR: 93 mL/min/{1.73_m2} (ref >59)

## 2021-07-12 LAB — CBC
Hematocrit: 39.9 % (ref 34.0–46.6)
Hemoglobin: 13.3 g/dL (ref 11.1–15.9)
MCH: 29.6 pg (ref 26.6–33.0)
MCHC: 33.3 g/dL (ref 31.5–35.7)
MCV: 89 fL (ref 79–97)
Platelets: 352 10*3/uL (ref 150–450)
RBC: 4.5 x10E6/uL (ref 3.77–5.28)
RDW: 11.5 % — ABNORMAL LOW (ref 11.7–15.4)
WBC: 9.9 10*3/uL (ref 3.4–10.8)

## 2021-07-12 LAB — HEMOGLOBIN A1C
Est. average glucose Bld gHb Est-mCnc: 128 mg/dL
Hgb A1c MFr Bld: 6.1 % — ABNORMAL HIGH (ref 4.8–5.6)

## 2021-07-12 LAB — LIPID PANEL
Chol/HDL Ratio: 2.8 ratio (ref 0.0–4.4)
Cholesterol, Total: 176 mg/dL (ref 100–199)
HDL: 63 mg/dL (ref >39)
LDL Chol Calc (NIH): 100 mg/dL — ABNORMAL HIGH (ref 0–99)
Triglycerides: 69 mg/dL (ref 0–149)
VLDL Cholesterol Cal: 13 mg/dL (ref 5–40)

## 2021-07-12 MED ORDER — PHENTERMINE HCL 37.5 MG PO TABS: ORAL_TABLET | ORAL | 0 refills | Status: DC

## 2021-07-12 MED ORDER — CYCLOBENZAPRINE HCL 5 MG PO TABS
ORAL_TABLET | ORAL | 0 refills | Status: DC
Start: 2021-07-12 — End: 2022-08-08

## 2021-07-12 NOTE — Progress Notes (Signed)
I,Wendy Parks,acting as a Education administrator for Wendy Greenland, MD.,have documented all relevant documentation on the behalf of Wendy Greenland, MD,as directed by  Wendy Greenland, MD while in the presence of Wendy Greenland, MD.  This visit occurred during the SARS-CoV-2 public health emergency.  Safety protocols were in place, including screening questions prior to the visit, additional usage of staff PPE, and extensive cleaning of exam room while observing appropriate contact time as indicated for disinfecting solutions.  Subjective:     Patient ID: Wendy Parks , female    DOB: 05/02/1981 , 40 y.o.   MRN: [id number]   Chief Complaint  Patient presents with   Annual Exam    HPI  She is here today for a full physical examination.  She is followed by Dr. Mancel Bale for her Gyn care. She reports having a PAP in May.     Past Medical History:  Diagnosis Date   Kidney stone      Family History  Problem Relation Age of Onset   Healthy Mother    Dementia Father    Cancer Father      Current Outpatient Medications:    albuterol (VENTOLIN HFA) 108 (90 Base) MCG/ACT inhaler, Inhale 2 puffs into the lungs every 6 (six) hours as needed for wheezing or shortness of breath., Disp: 18 g, Rfl: 1   baclofen (LIORESAL) 10 MG tablet, Take 10 mg by mouth 2 (two) times daily as needed., Disp: , Rfl:    benzonatate (TESSALON PERLES) 100 MG capsule, Take 1 capsule (100 mg total) by mouth every 6 (six) hours as needed., Disp: 30 capsule, Rfl: 1   BOTOX 100 units SOLR injection, , Disp: , Rfl:    cyclobenzaprine (FLEXERIL) 5 MG tablet, One tab po qpm prn back pain, Disp: 30 tablet, Rfl: 0   montelukast (SINGULAIR) 10 MG tablet, TAKE 1 TABLET BY MOUTH EVERY DAY, Disp: 90 tablet, Rfl: 1   phentermine (ADIPEX-P) 37.5 MG tablet, Take 1/2 tab po qam x 14 days, then one tab po qam, Disp: 30 tablet, Rfl: 0   topiramate (TOPAMAX) 200 MG tablet, Take 200 mg by mouth daily., Disp: , Rfl:    EMGALITY 120 MG/ML SOAJ,  Inject into the skin., Disp: , Rfl:    No Known Allergies    The patient states she uses tubal ligation for birth control. Last LMP was No LMP recorded. Patient has had an ablation.. Negative for Dysmenorrhea. Negative for: breast discharge, breast lump(s), breast pain and breast self exam. Associated symptoms include abnormal vaginal bleeding. Pertinent negatives include abnormal bleeding (hematology), anxiety, decreased libido, depression, difficulty falling sleep, dyspareunia, history of infertility, nocturia, sexual dysfunction, sleep disturbances, urinary incontinence, urinary urgency, vaginal discharge and vaginal itching. Diet regular.The patient states her exercise level is  moderate.(Prior to food surgery)x  . The patient's tobacco use is:  Social History   Tobacco Use  Smoking Status Never  Smokeless Tobacco Never  . She has been exposed to passive smoke. The patient's alcohol use is:  Social History   Substance and Sexual Activity  Alcohol Use Yes   Comment: occasionally   Review of Systems  Constitutional: Negative.   HENT: Negative.    Eyes: Negative.   Respiratory: Negative.    Cardiovascular: Negative.   Gastrointestinal: Negative.   Endocrine: Negative.   Genitourinary: Negative.   Musculoskeletal:  Positive for back pain.       She c/o LBP -feels like she is having muscle spasms. She has  had similar sx in the past. Feels like there are "bricks" in her back. Denies LE paresthesias/weakness. Denies fall/trauma. She has received chiropractic care in the past.   Skin: Negative.   Allergic/Immunologic: Negative.   Neurological: Negative.   Hematological: Negative.   Psychiatric/Behavioral: Negative.      Today's Vitals   07/12/21 0943  BP: 128/68  Pulse: 80  Temp: 98.6 F (37 C)  TempSrc: Oral  Weight: 203 lb 6.4 oz (92.3 kg)  Height: 5' 5.8" (1.671 m)   Body mass index is 33.03 kg/m.  Wt Readings from Last 3 Encounters:  07/12/21 203 lb 6.4 oz (92.3 kg)   01/28/21 197 lb (89.4 kg)  07/01/20 207 lb 9.6 oz (94.2 kg)    Objective:  Physical Exam Vitals and nursing note reviewed.  Constitutional:      Appearance: Normal appearance.  HENT:     Head: Normocephalic and atraumatic.     Right Ear: Tympanic membrane, ear canal and external ear normal.     Left Ear: Tympanic membrane, ear canal and external ear normal.     Nose:     Comments: Masked     Mouth/Throat:     Comments: Masked  Eyes:     Extraocular Movements: Extraocular movements intact.     Conjunctiva/sclera: Conjunctivae normal.     Pupils: Pupils are equal, round, and reactive to light.  Cardiovascular:     Rate and Rhythm: Normal rate and regular rhythm.     Pulses: Normal pulses.     Heart sounds: Normal heart sounds.  Pulmonary:     Effort: Pulmonary effort is normal.     Breath sounds: Normal breath sounds.  Abdominal:     General: Abdomen is flat. Bowel sounds are normal.     Palpations: Abdomen is soft.  Genitourinary:    Comments: deferred Musculoskeletal:        General: Tenderness present. Normal range of motion.     Cervical back: Normal range of motion and neck supple.  Skin:    General: Skin is warm and dry.  Neurological:     General: No focal deficit present.     Mental Status: She is alert and oriented to person, place, and time.  Psychiatric:        Mood and Affect: Mood normal.        Behavior: Behavior normal.        Assessment And Plan:     1. Routine general medical examination at health care facility Comments: A full exam was performed. She is up to date with cervical/breast CA screenings. Importance of monthly SBE was d/w patient. PATIENT IS ADVISED TO GET 30-45 MINUTES REGULAR EXERCISE NO LESS THAN FOUR TO FIVE DAYS PER WEEK - BOTH WEIGHTBEARING EXERCISES AND AEROBIC ARE RECOMMENDED.  PATIENT IS ADVISED TO FOLLOW A HEALTHY DIET WITH AT LEAST SIX FRUITS/VEGGIES PER DAY, DECREASE INTAKE OF RED MEAT, AND TO INCREASE FISH INTAKE TO TWO DAYS  PER WEEK.  MEATS/FISH SHOULD NOT BE FRIED, BAKED OR BROILED IS PREFERABLE.  IT IS ALSO IMPORTANT TO CUT BACK ON YOUR SUGAR INTAKE. PLEASE AVOID ANYTHING WITH ADDED SUGAR, CORN SYRUP OR OTHER SWEETENERS. IF YOU MUST USE A SWEETENER, YOU CAN TRY STEVIA. IT IS ALSO IMPORTANT TO AVOID ARTIFICIALLY SWEETENERS AND DIET BEVERAGES. LASTLY, I SUGGEST WEARING SPF 50 SUNSCREEN ON EXPOSED PARTS AND ESPECIALLY WHEN IN THE DIRECT SUNLIGHT FOR AN EXTENDED PERIOD OF TIME.  PLEASE AVOID FAST FOOD RESTAURANTS AND INCREASE YOUR WATER INTAKE.  -  CBC - Hemoglobin A1c - CMP14+EGFR - Lipid panel - POCT Urinalysis Dipstick (81002) - EKG 12-Lead  2. Chronic bilateral low back pain without sciatica Comments: I will send rx cyclobenzaprine to take nightly prn. She was also given stretching exercises to perform. She was also given the contact information for [facility]. Encouraged to call today to schedule an appt with Dr. Lynnda Shields. She agrees to continue with Mg supplementation.  - cyclobenzaprine (FLEXERIL) 5 MG tablet; One tab po qpm prn back pain  Dispense: 30 tablet; Refill: 0  3. Class 1 obesity due to excess calories without serious comorbidity with body mass index (BMI) of 33.0 to 33.9 in adult Comments: She has gained 6 lbs since June. She is interested in starting phentermine to help with weight loss. We will start with 1/2 tab 37.81m daily x 14 days, then one tab daily. She is reminded of possible side effects including tachycardia, dry mouth and palpitations. She is encouraged to stay well hydrated. Baseline EKG obtained, NSR w/o arrhythmia.  - phentermine (ADIPEX-P) 37.5 MG tablet; Take 1/2 tab po qam x 14 days, then one tab po qam  Dispense: 30 tablet; Refill: 0  Patient was given opportunity to ask questions. Patient verbalized understanding of the plan and was able to repeat key elements of the plan. All questions were answered to their satisfaction.   I, RMaximino Greenland MD, have reviewed all  documentation for this visit. The documentation on 07/12/21 for the exam, diagnosis, procedures, and orders are all accurate and complete.   THE PATIENT IS ENCOURAGED TO PRACTICE SOCIAL DISTANCING DUE TO THE COVID-19 PANDEMIC.

## 2021-07-12 NOTE — Patient Instructions (Addendum)
Chronic Back Pain When back pain lasts longer than 3 months, it is called chronic back pain. The cause of your back pain may not be known. Some common causes include: Wear and tear (degenerative disease) of the bones, ligaments, or disks in your back. Inflammation and stiffness in your back (arthritis). People who have chronic back pain often go through certain periods in which the pain is more intense (flare-ups). Many people can learn to manage the pain with home care. Follow these instructions at home: Pay attention to any changes in your symptoms. Take these actions to help with your pain: Managing pain and stiffness   If directed, apply ice to the painful area. Your health care provider may recommend applying ice during the first 24-48 hours after a flare-up begins. To do this: Put ice in a plastic bag. Place a towel between your skin and the bag. Leave the ice on for 20 minutes, 2-3 times per day. If directed, apply heat to the affected area as often as told by your health care provider. Use the heat source that your health care provider recommends, such as a moist heat pack or a heating pad. Place a towel between your skin and the heat source. Leave the heat on for 20-30 minutes. Remove the heat if your skin turns bright red. This is especially important if you are unable to feel pain, heat, or cold. You may have a greater risk of getting burned. Try soaking in a warm tub. Activity  Avoid bending and other activities that make the problem worse. Maintain a proper position when standing or sitting: When standing, keep your upper back and neck straight, with your shoulders pulled back. Avoid slouching. When sitting, keep your back straight and relax your shoulders. Do not round your shoulders or pull them backward. Do not sit or stand in one place for long periods of time. Take brief periods of rest throughout the day. This will reduce your pain. Resting in a lying or standing position  is usually better than sitting to rest. When you are resting for longer periods, mix in some mild activity or stretching between periods of rest. This will help to prevent stiffness and pain. Get regular exercise. Ask your health care provider what activities are safe for you. Do not lift anything that is heavier than 10 lb (4.5 kg), or the limit that you are told, until your health care provider says that it is safe. Always use proper lifting technique, which includes: Bending your knees. Keeping the load close to your body. Avoiding twisting. Sleep on a firm mattress in a comfortable position. Try lying on your side with your knees slightly bent. If you lie on your back, put a pillow under your knees. Medicines Treatment may include medicines for pain and inflammation taken by mouth or applied to the skin, prescription pain medicine, or muscle relaxants. Take over-the-counter and prescription medicines only as told by your health care provider. Ask your health care provider if the medicine prescribed to you: Requires you to avoid driving or using machinery. Can cause constipation. You may need to take these actions to prevent or treat constipation: Drink enough fluid to keep your urine pale yellow. Take over-the-counter or prescription medicines. Eat foods that are high in fiber, such as beans, whole grains, and fresh fruits and vegetables. Limit foods that are high in fat and processed sugars, such as fried or sweet foods. General instructions Do not use any products that contain nicotine or tobacco, such   as cigarettes, e-cigarettes, and chewing tobacco. If you need help quitting, ask your health care provider. Keep all follow-up visits as told by your health care provider. This is important. Contact a health care provider if: You have pain that is not relieved with rest or medicine. Your pain gets worse, or you have new pain. You have a high fever. You have rapid weight loss. You have  trouble doing your normal activities. Get help right away if: You have weakness or numbness in one or both of your legs or feet. You have trouble controlling your bladder or your bowels. You have severe back pain and have any of the following: Nausea or vomiting. Pain in your abdomen. Shortness of breath or you faint. Summary Chronic back pain is back pain that lasts longer than 3 months. When a flare-up begins, apply ice to the painful area for the first 24-48 hours. Apply a moist heat pad or use a heating pad on the painful area as directed by your health care provider. When you are resting for longer periods, mix in some mild activity or stretching between periods of rest. This will help to prevent stiffness and pain. This information is not intended to replace advice given to you by your health care provider. Make sure you discuss any questions you have with your health care provider. Document Revised: 09/18/2019 Document Reviewed: 09/18/2019 Elsevier Patient Education  2022 Wendy Parks, Wendy Parks Adopting a healthy lifestyle and getting preventive care are important in promoting health and wellness. Ask your health care provider about: The right schedule for you to have regular tests and exams. Things you can do on your own to prevent diseases and keep yourself healthy. What should I know about diet, weight, and exercise? Eat a healthy diet  Eat a diet that includes plenty of vegetables, fruits, low-fat dairy products, and lean protein. Do not eat a lot of foods that are high in solid fats, added sugars, or sodium. Maintain a healthy weight Body mass index (BMI) is used to identify weight problems. It estimates body fat based on height and weight. Your health care provider can help determine your BMI and help you achieve or maintain a healthy weight. Get regular exercise Get regular exercise. This is one of the most important things you can do for your health.  Most adults should: Exercise for at least 150 minutes each week. The exercise should increase your heart rate and make you sweat (moderate-intensity exercise). Do strengthening exercises at least twice a week. This is in addition to the moderate-intensity exercise. Spend less time sitting. Even light physical activity can be beneficial. Watch cholesterol and blood lipids Have your blood tested for lipids and cholesterol at 40 years of age, then have this test every 5 years. Have your cholesterol levels checked more often if: Your lipid or cholesterol levels are high. You are older than 40 years of age. You are at high risk for heart disease. What should I know about cancer screening? Depending on your health history and family history, you may need to have cancer screening at various ages. This may include screening for: Breast cancer. Cervical cancer. Colorectal cancer. Skin cancer. Lung cancer. What should I know about heart disease, diabetes, and high blood pressure? Blood pressure and heart disease High blood pressure causes heart disease and increases the risk of stroke. This is more likely to develop in people who have high blood pressure readings or are overweight. Have your blood pressure checked:  Every 3-5 years if you are 88-2 years of age. Every year if you are 47 years old or older. Diabetes Have regular diabetes screenings. This checks your fasting blood sugar level. Have the screening done: Once every three years after age 44 if you are at a normal weight and have a low risk for diabetes. More often and at a younger age if you are overweight or have a high risk for diabetes. What should I know about preventing infection? Hepatitis B If you have a higher risk for hepatitis B, you should be screened for this virus. Talk with your health care provider to find out if you are at risk for hepatitis B infection. Hepatitis C Testing is recommended for: Everyone born from [birth year]  through 1965. Anyone with known risk factors for hepatitis C. Sexually transmitted infections (STIs) Get screened for STIs, including gonorrhea and chlamydia, if: You are sexually active and are younger than 40 years of age. You are older than 40 years of age and your health care provider tells you that you are at risk for this type of infection. Your sexual activity has changed since you were last screened, and you are at increased risk for chlamydia or gonorrhea. Ask your health care provider if you are at risk. Ask your health care provider about whether you are at high risk for HIV. Your health care provider may recommend a prescription medicine to help prevent HIV infection. If you choose to take medicine to prevent HIV, you should first get tested for HIV. You should then be tested every 3 months for as long as you are taking the medicine. Pregnancy If you are about to stop having your period (premenopausal) and you may become pregnant, seek counseling before you get pregnant. Take 400 to 800 micrograms (mcg) of folic acid every day if you become pregnant. Ask for birth control (contraception) if you want to prevent pregnancy. Osteoporosis and menopause Osteoporosis is a disease in which the bones lose minerals and strength with aging. This can result in bone fractures. If you are 45 years old or older, or if you are at risk for osteoporosis and fractures, ask your health care provider if you should: Be screened for bone loss. Take a calcium or vitamin D supplement to lower your risk of fractures. Be given hormone replacement therapy (HRT) to treat symptoms of menopause. Follow these instructions at home: Alcohol use Do not drink alcohol if: Your health care provider tells you not to drink. You are pregnant, may be pregnant, or are planning to become pregnant. If you drink alcohol: Limit how much you have to: 0-1 drink a day. Know how much alcohol is in your drink. In the [place] one  drink equals one 12 oz bottle of beer (355 mL), one 5 oz glass of wine (148 mL), or one 1 oz glass of hard liquor (44 mL). Lifestyle Do not use any products that contain nicotine or tobacco. These products include cigarettes, chewing tobacco, and vaping devices, such as e-cigarettes. If you need help quitting, ask your health care provider. Do not use street drugs. Do not share needles. Ask your health care provider for help if you need support or information about quitting drugs. General instructions Schedule regular health, dental, and eye exams. Stay current with your vaccines. Tell your health care provider if: You often feel depressed. You have ever been abused or do not feel safe at home. Summary Adopting a healthy lifestyle and getting preventive care are important in  promoting health and wellness. Follow your health care provider's instructions about healthy diet, exercising, and getting tested or screened for diseases. Follow your health care provider's instructions on monitoring your cholesterol and blood pressure. This information is not intended to replace advice given to you by your health care provider. Make sure you discuss any questions you have with your health care provider. Document Revised: 12/28/2020 Document Reviewed: 12/28/2020 Elsevier Patient Education  [place].

## 2021-07-13 ENCOUNTER — Ambulatory Visit: Payer: BC Managed Care – PPO

## 2021-07-13 DIAGNOSIS — R2681 Unsteadiness on feet: Secondary | ICD-10-CM | POA: Diagnosis not present

## 2021-07-13 DIAGNOSIS — R2689 Other abnormalities of gait and mobility: Secondary | ICD-10-CM

## 2021-07-13 DIAGNOSIS — R262 Difficulty in walking, not elsewhere classified: Secondary | ICD-10-CM

## 2021-07-13 DIAGNOSIS — M6281 Muscle weakness (generalized): Secondary | ICD-10-CM

## 2021-07-13 NOTE — Therapy (Addendum)
[place], [place], [address] Phone: [phone]   Fax:  [phone]  Physical Therapy Treatment  Patient Details  Name: Wendy Parks MRN: [id number] Date of Birth: [birth year] Referring Provider (PT): Landis Martins, [place]   Encounter Date: 07/13/2021   PT End of Session - 07/13/21 1823     Visit Number 4    Number of Visits 9    Date for PT Re-Evaluation 07/30/21    Authorization Type [employer]    Authorization Time Period FOTO v6, v10    PT Start Time 1700    PT Stop Time 1755   10 minutes on game ready   PT Time Calculation (min) 55 min             Past Medical History:  Diagnosis Date   Kidney stone     Past Surgical History:  Procedure Laterality Date   BUNIONECTOMY  11/16/2020   ENDOMETRIAL ABLATION  01/2019   Dr. Mancel Bale   KIDNEY STONE SURGERY     TUBAL LIGATION  2012    There were no vitals filed for this visit.   Subjective Assessment - 07/13/21 1705     Subjective Pt says she has been doing her HEP and believes it has helped her pain. Pt still has pain 2/10 that is constant and "aching". Pt says she is wearing compression socks and that helps her pain. Pt says that the pain is still constant and nothing relieves it completely.    Pertinent History Rt [name] bunionectomy 11/16/2020    Limitations Walking;Standing;House hold activities    How long can you sit comfortably? Unlimited    How long can you stand comfortably? 15 minute    How long can you walk comfortably? 15 minutes    Patient Stated Goals Walk/ run for exercise, household chores    Currently in Pain? Yes    Pain Score 2     Pain Location Ankle    Pain Orientation Right    Pain Descriptors / Indicators Aching    Pain Type Surgical pain;Chronic pain    Pain Onset More than a month ago    Pain Frequency Constant    Aggravating Factors  walking and standing    Pain Relieving Factors rest, pain medication, ice               [facility] Adult PT Treatment/Exercise:   Therapeutic Exercise: - right seated heel raises 3x10 - toe scrunches Rt foot only, 3x30secs - calf stretch at wall 3x30s each leg - toe stretch against wall 3x30s Rt only - bilat standing calf raises 1x10    Manual Therapy: - PROM of Rt toe, foot, and ankle in all directions  - soft tissue massage of dorsum and plantar aspect of the Rt foot  - Joint mobilizations (grade 3-4) along 1st metatarsal and midfoot into flexion and extension of Rt great toe. Abd/add of metatarsal joint, anterior and posterior glides.  - PNF contract relax of great toe flexion/extension   Neuromuscular re-ed: - SLS Rt only 3x30s    Therapeutic Activity: - NA   Self-care/Home Management: -NA   Modalities - Gameready ankle elevated 10 minutes, mod compression, 36deg          [facility] PT Assessment - 07/14/21 0001       Palpation   Palpation comment Visable and palpable edema, joint stiffness/tightness in Rt 1st metatarsal joint. reports pain with palpation around incision site .  PT Short Term Goals - 06/29/21 1859       PT SHORT TERM GOAL #1   Title Pt will report understanding and adherence to her HEP in order to promote independence in the management of her primary sxs.    Baseline HEP provided at eval    Time 4    Period Weeks    Status Achieved    Target Date 07/02/21               PT Long Term Goals - 06/04/21 1316       PT LONG TERM GOAL #1   Title Pt will achieve a FOT score of 71% in order to demonstrate improved functional ability as it relates to her great toe pain.    Baseline 52%    Time 8    Period Weeks    Status New    Target Date 07/30/21      PT LONG TERM GOAL #2   Title Pt will achieve R great toe extension AROM of 45 degrees or greater in order to promote WNL gait with decreased pain.    Baseline 35 degrees    Time 8    Period Weeks     Status New    Target Date 07/30/21      PT LONG TERM GOAL #3   Title Pt will report ability to walk 30 minutes with 0-1/10 pain in order to return to walking for exercise without limitation due to pain.    Baseline Pt reports >4/10 pain after 15 minutes of walking    Time 8    Period Weeks    Status New    Target Date 07/30/21      PT LONG TERM GOAL #4   Title Pt will achieve 10 full-depth squats with 0-1/10 pain in order to progress her independent LE strengthening program without limitation.    Baseline >4/10 pain with 1 squat    Time 8    Period Weeks    Status New    Target Date 07/30/21      PT LONG TERM GOAL #5   Title Pt will achieve R single leg stance of 15 seconds or longer in order to promote functional balance.    Baseline 3 step errors in 15 seconds    Time 8    Period Weeks    Status New    Target Date 07/30/21                   Plan - 07/13/21 1824     Clinical Impression Statement Pt tolerated todays treatment session well. Continued reports of pain and edema in the 1st metatarsal joint. Able to achieve bilat calf raise with no reports of increased pain in Rt 1st metatarsal, but "tightness" felt in Rt achilles tendon after 5-6 repititions. Palpable caveatations felt across 1st metatarsal joint during PROM and joint mobilizations with no reports of pain and palpable improvement in joint mobility following manual therapy.    PT Treatment/Interventions ADLs/Self Care Home Management;Cryotherapy;Gait training;Stair training;Functional mobility training;Therapeutic activities;Therapeutic exercise;Balance training;Neuromuscular re-education;Manual techniques;Passive range of motion;Scar mobilization;Compression bandaging;Patient/family education;Taping;Vasopneumatic Device;Manual lymph drainage    PT Next Visit Plan Progress closed-chain ankle/ great toe exercises (assess new HEP); Assess PROM and AROM of flexion and extension of Rt great toe;    PT Home Exercise  Plan [id number]             Patient will benefit from skilled therapeutic intervention in order to improve the  following deficits and impairments:  Difficulty walking, Decreased range of motion, Pain, Impaired flexibility, Decreased mobility, Decreased strength, Increased edema  Visit Diagnosis: Difficulty in walking, not elsewhere classified  Muscle weakness (generalized)  Other abnormalities of gait and mobility  Unsteadiness on feet     Problem List Patient Active Problem List   Diagnosis Date Noted   Allergic rhinitis 11/26/2020   Amenorrhea 11/26/2020   Benign essential hypertension 11/26/2020   Body mass index (BMI) 34.0-34.9, adult 11/26/2020   Obesity, unspecified 11/26/2020   Other specified counseling 11/26/2020   Dysuria 11/26/2020   Effusion of hand joint 11/26/2020   Elevated blood-pressure reading without diagnosis of hypertension 11/26/2020   Esophageal reflux 11/26/2020   Gastritis 11/26/2020   Neck pain 11/26/2020   Oligomenorrhea 11/26/2020   Other abnormal glucose 11/26/2020   Pain in joint, pelvic region and thigh 11/26/2020   Patellofemoral syndrome 11/26/2020   Abdominal pain 11/26/2020   Breast pain 11/26/2020   Chest pain 11/26/2020   Pelvic pain 11/26/2020   Pre-operative examination 11/26/2020   Screening for malignant neoplasm of cervix 11/26/2020   Sebaceous cyst 11/26/2020   Sinusitis 11/26/2020   Surgery follow-up examination 11/26/2020   Ureteral stone 11/26/2020   Vaginal discharge 11/26/2020   Nephrolithiasis 06/24/2019   Herpes simplex 12/25/2018   Fibroadenoma of right breast 06/06/2016   Cyst of ovary 12/14/2015   History of hypertension 12/14/2015   Patellofemoral syndrome of right knee 12/14/2015   Uterine leiomyoma 12/14/2015   Glade Lloyd, SPT 07/14/21 9:13 AM   [facility] [address] [place], [place], [address] Phone: [phone]   Fax:   [phone]  Name: Wendy Parks MRN: [id number] Date of Birth: [birth year]

## 2021-07-14 DIAGNOSIS — M5414 Radiculopathy, thoracic region: Secondary | ICD-10-CM | POA: Diagnosis not present

## 2021-07-14 DIAGNOSIS — M5386 Other specified dorsopathies, lumbar region: Secondary | ICD-10-CM | POA: Diagnosis not present

## 2021-07-14 DIAGNOSIS — G43719 Chronic migraine without aura, intractable, without status migrainosus: Secondary | ICD-10-CM | POA: Diagnosis not present

## 2021-07-14 DIAGNOSIS — M9905 Segmental and somatic dysfunction of pelvic region: Secondary | ICD-10-CM | POA: Diagnosis not present

## 2021-07-14 DIAGNOSIS — M9903 Segmental and somatic dysfunction of lumbar region: Secondary | ICD-10-CM | POA: Diagnosis not present

## 2021-07-16 DIAGNOSIS — M5414 Radiculopathy, thoracic region: Secondary | ICD-10-CM | POA: Diagnosis not present

## 2021-07-16 DIAGNOSIS — M5386 Other specified dorsopathies, lumbar region: Secondary | ICD-10-CM | POA: Diagnosis not present

## 2021-07-16 DIAGNOSIS — M9903 Segmental and somatic dysfunction of lumbar region: Secondary | ICD-10-CM | POA: Diagnosis not present

## 2021-07-16 DIAGNOSIS — M9905 Segmental and somatic dysfunction of pelvic region: Secondary | ICD-10-CM | POA: Diagnosis not present

## 2021-07-20 ENCOUNTER — Other Ambulatory Visit: Payer: Self-pay

## 2021-07-20 ENCOUNTER — Ambulatory Visit: Payer: BC Managed Care – PPO

## 2021-07-20 DIAGNOSIS — R2689 Other abnormalities of gait and mobility: Secondary | ICD-10-CM | POA: Diagnosis not present

## 2021-07-20 DIAGNOSIS — R262 Difficulty in walking, not elsewhere classified: Secondary | ICD-10-CM

## 2021-07-20 DIAGNOSIS — R2681 Unsteadiness on feet: Secondary | ICD-10-CM | POA: Diagnosis not present

## 2021-07-20 DIAGNOSIS — M6281 Muscle weakness (generalized): Secondary | ICD-10-CM | POA: Diagnosis not present

## 2021-07-20 NOTE — Therapy (Addendum)
[place], [place], [address] Phone: [phone]   Fax:  [phone]  Physical Therapy Treatment  Patient Details  Name: Wendy Parks MRN: [id number] Date of Birth: 06/27/1981 Referring Provider (PT): Landis Martins, [place]   Encounter Date: 07/20/2021   PT End of Session - 07/20/21 1727     Visit Number 5    Number of Visits 9    Date for PT Re-Evaluation 07/30/21    Authorization Type BCBS    Authorization Time Period FOTO v6, v10    PT Start Time 1718   session limited due to patient being late.   PT Stop Time 1800   10 minutes for gameready   PT Time Calculation (min) 42 min    Activity Tolerance Patient tolerated treatment well    Behavior During Therapy [facility] for tasks assessed/performed             Past Medical History:  Diagnosis Date   Kidney stone     Past Surgical History:  Procedure Laterality Date   BUNIONECTOMY  11/16/2020   ENDOMETRIAL ABLATION  01/2019   Dr. Mancel Bale   KIDNEY STONE SURGERY     TUBAL LIGATION  2012    There were no vitals filed for this visit.   Subjective Assessment - 07/20/21 1720     Subjective Pt was late due to traffic. Pt says that she has been busy and has had to ice her foot twice a day. Currently pt says her pain is 1-2/10.    Pertinent History Rt [name] bunionectomy 11/16/2020    Limitations Walking;Standing;House hold activities    How long can you sit comfortably? Unlimited    How long can you stand comfortably? 15 minute    How long can you walk comfortably? 15 minutes    Patient Stated Goals Walk/ run for exercise, household chores    Currently in Pain? Yes    Pain Score 2     Pain Location Ankle    Pain Orientation Right    Pain Descriptors / Indicators Aching    Pain Type Surgical pain;Chronic pain    Pain Radiating Towards up foot to lateral ankle/heel    Pain Onset More than a month ago    Pain Frequency Constant    Aggravating  Factors  walking and standing    Pain Relieving Factors rest, pain medication, ice, "popping it"                [facility] PT Assessment - 07/21/21 0001       AROM   Overall AROM Comments Rt toe ext 23deg      PROM   Overall PROM Comments Rt toe extension 55deg             [facility] Adult PT Treatment/Exercise:  Therapeutic Exercise: - Bilat standing heel raise 2x12  - Calf stretch 2x30sec  - Toe extension stretch 2x30sec  - Rt SLS on DF and PF rocker board 4x30s  - Leg press heel raise Rt only 1x12 25lbs, 1x12 35lbs  - Reverse lunges Rt only 2x12  - Posterior weight shift Rt back 2x12   Manual Therapy: - NA  Neuromuscular re-ed: - SLS on airex Rt only 3x30sec   Therapeutic Activity: - NA  Self-care/Home Management: - NA  Modalities - Gameready Rt ankle, 10 minutes, supine with Rt foot elevated  PT Short Term Goals - 06/29/21 1859       PT SHORT TERM GOAL #1   Title Pt will report understanding and adherence to her HEP in order to promote independence in the management of her primary sxs.    Baseline HEP provided at eval    Time 4    Period Weeks    Status Achieved    Target Date 07/02/21               PT Long Term Goals - 06/04/21 1316       PT LONG TERM GOAL #1   Title Pt will achieve a FOT score of 71% in order to demonstrate improved functional ability as it relates to her great toe pain.    Baseline 52%    Time 8    Period Weeks    Status New    Target Date 07/30/21      PT LONG TERM GOAL #2   Title Pt will achieve R great toe extension AROM of 45 degrees or greater in order to promote WNL gait with decreased pain.    Baseline 35 degrees    Time 8    Period Weeks    Status New    Target Date 07/30/21      PT LONG TERM GOAL #3   Title Pt will report ability to walk 30 minutes with 0-1/10 pain in order to return to walking for exercise without limitation due to pain.    Baseline Pt reports  >4/10 pain after 15 minutes of walking    Time 8    Period Weeks    Status New    Target Date 07/30/21      PT LONG TERM GOAL #4   Title Pt will achieve 10 full-depth squats with 0-1/10 pain in order to progress her independent LE strengthening program without limitation.    Baseline >4/10 pain with 1 squat    Time 8    Period Weeks    Status New    Target Date 07/30/21      PT LONG TERM GOAL #5   Title Pt will achieve R single leg stance of 15 seconds or longer in order to promote functional balance.    Baseline 3 step errors in 15 seconds    Time 8    Period Weeks    Status New    Target Date 07/30/21                   Plan - 07/20/21 1751     Clinical Impression Statement Pt tolerated todays treatment session well. Session focused on CKC mobility and strengthening of foot and ankle. Pt reported moderate increase in pain throughout all WB exercises. Pt reports "painful pop" during reverse lunges, but then pain returned to baseline. Skin WNL following Gameready with reports of pain returning to baseline at the end of session.    PT Treatment/Interventions ADLs/Self Care Home Management;Cryotherapy;Gait training;Stair training;Functional mobility training;Therapeutic activities;Therapeutic exercise;Balance training;Neuromuscular re-education;Manual techniques;Passive range of motion;Scar mobilization;Compression bandaging;Patient/family education;Taping;Vasopneumatic Device;Manual lymph drainage    PT Next Visit Plan Progress closed-chain ankle/ great toe exercises (assess new HEP)    PT Home Exercise Plan [phone]             Patient will benefit from skilled therapeutic intervention in order to improve the following deficits and impairments:  Difficulty walking, Decreased range of motion, Pain, Impaired flexibility, Decreased mobility, Decreased strength, Increased edema  Visit Diagnosis: No diagnosis found.  Problem List Patient Active Problem List    Diagnosis Date Noted   Allergic rhinitis 11/26/2020   Amenorrhea 11/26/2020   Benign essential hypertension 11/26/2020   Body mass index (BMI) 34.0-34.9, adult 11/26/2020   Obesity, unspecified 11/26/2020   Other specified counseling 11/26/2020   Dysuria 11/26/2020   Effusion of hand joint 11/26/2020   Elevated blood-pressure reading without diagnosis of hypertension 11/26/2020   Esophageal reflux 11/26/2020   Gastritis 11/26/2020   Neck pain 11/26/2020   Oligomenorrhea 11/26/2020   Other abnormal glucose 11/26/2020   Pain in joint, pelvic region and thigh 11/26/2020   Patellofemoral syndrome 11/26/2020   Abdominal pain 11/26/2020   Breast pain 11/26/2020   Chest pain 11/26/2020   Pelvic pain 11/26/2020   Pre-operative examination 11/26/2020   Screening for malignant neoplasm of cervix 11/26/2020   Sebaceous cyst 11/26/2020   Sinusitis 11/26/2020   Surgery follow-up examination 11/26/2020   Ureteral stone 11/26/2020   Vaginal discharge 11/26/2020   Nephrolithiasis 06/24/2019   Herpes simplex 12/25/2018   Fibroadenoma of right breast 06/06/2016   Cyst of ovary 12/14/2015   History of hypertension 12/14/2015   Patellofemoral syndrome of right knee 12/14/2015   Uterine leiomyoma 12/14/2015    Glade Lloyd, SPT 07/21/21 11:24 AM   [facility] [address] [place], [place], [address] Phone: [phone]   Fax:  [phone]  Name: Wendy Parks MRN: [id number] Date of Birth: 1981-05-26

## 2021-07-23 DIAGNOSIS — M9903 Segmental and somatic dysfunction of lumbar region: Secondary | ICD-10-CM | POA: Diagnosis not present

## 2021-07-23 DIAGNOSIS — M9905 Segmental and somatic dysfunction of pelvic region: Secondary | ICD-10-CM | POA: Diagnosis not present

## 2021-07-23 DIAGNOSIS — M5414 Radiculopathy, thoracic region: Secondary | ICD-10-CM | POA: Diagnosis not present

## 2021-07-23 DIAGNOSIS — M5386 Other specified dorsopathies, lumbar region: Secondary | ICD-10-CM | POA: Diagnosis not present

## 2021-07-26 DIAGNOSIS — M5414 Radiculopathy, thoracic region: Secondary | ICD-10-CM | POA: Diagnosis not present

## 2021-07-26 DIAGNOSIS — M9903 Segmental and somatic dysfunction of lumbar region: Secondary | ICD-10-CM | POA: Diagnosis not present

## 2021-07-26 DIAGNOSIS — M5386 Other specified dorsopathies, lumbar region: Secondary | ICD-10-CM | POA: Diagnosis not present

## 2021-07-26 DIAGNOSIS — M9905 Segmental and somatic dysfunction of pelvic region: Secondary | ICD-10-CM | POA: Diagnosis not present

## 2021-07-29 ENCOUNTER — Other Ambulatory Visit: Payer: Self-pay

## 2021-07-29 ENCOUNTER — Ambulatory Visit (INDEPENDENT_AMBULATORY_CARE_PROVIDER_SITE_OTHER): Payer: BC Managed Care – PPO | Admitting: Sports Medicine

## 2021-07-29 ENCOUNTER — Ambulatory Visit (INDEPENDENT_AMBULATORY_CARE_PROVIDER_SITE_OTHER): Payer: BC Managed Care – PPO

## 2021-07-29 DIAGNOSIS — M21611 Bunion of right foot: Secondary | ICD-10-CM | POA: Diagnosis not present

## 2021-07-29 DIAGNOSIS — Z9889 Other specified postprocedural states: Secondary | ICD-10-CM

## 2021-07-29 DIAGNOSIS — M79671 Pain in right foot: Secondary | ICD-10-CM | POA: Diagnosis not present

## 2021-07-29 DIAGNOSIS — M779 Enthesopathy, unspecified: Secondary | ICD-10-CM

## 2021-07-29 NOTE — Progress Notes (Signed)
Subjective: Wendy Parks is a 40 y.o. female patient seen today in office for POV # 7 (DOS 11/17/2019), S/P right [name] bunionectomy.  Patient reports that she is still having some pain sharp shooting in nature and sometimes pain with range of motion but states that physical therapy is helping with her mobility.  No other issues noted.  Patient Active Problem List   Diagnosis Date Noted   Allergic rhinitis 11/26/2020   Amenorrhea 11/26/2020   Benign essential hypertension 11/26/2020   Body mass index (BMI) 34.0-34.9, adult 11/26/2020   Obesity, unspecified 11/26/2020   Other specified counseling 11/26/2020   Dysuria 11/26/2020   Effusion of hand joint 11/26/2020   Elevated blood-pressure reading without diagnosis of hypertension 11/26/2020   Esophageal reflux 11/26/2020   Gastritis 11/26/2020   Neck pain 11/26/2020   Oligomenorrhea 11/26/2020   Other abnormal glucose 11/26/2020   Pain in joint, pelvic region and thigh 11/26/2020   Patellofemoral syndrome 11/26/2020   Abdominal pain 11/26/2020   Breast pain 11/26/2020   Chest pain 11/26/2020   Pelvic pain 11/26/2020   Pre-operative examination 11/26/2020   Screening for malignant neoplasm of cervix 11/26/2020   Sebaceous cyst 11/26/2020   Sinusitis 11/26/2020   Surgery follow-up examination 11/26/2020   Ureteral stone 11/26/2020   Vaginal discharge 11/26/2020   Nephrolithiasis 06/24/2019   Herpes simplex 12/25/2018   Fibroadenoma of right breast 06/06/2016   Cyst of ovary 12/14/2015   History of hypertension 12/14/2015   Patellofemoral syndrome of right knee 12/14/2015   Uterine leiomyoma 12/14/2015    Current Outpatient Medications on File Prior to Visit  Medication Sig Dispense Refill   albuterol (VENTOLIN HFA) 108 (90 Base) MCG/ACT inhaler Inhale 2 puffs into the lungs every 6 (six) hours as needed for wheezing or shortness of breath. 18 g 1   baclofen (LIORESAL) 10 MG tablet Take 10 mg by mouth 2 (two) times  daily as needed.     benzonatate (TESSALON PERLES) 100 MG capsule Take 1 capsule (100 mg total) by mouth every 6 (six) hours as needed. 30 capsule 1   BOTOX 100 units SOLR injection      cyclobenzaprine (FLEXERIL) 5 MG tablet One tab po qpm prn back pain 30 tablet 0   EMGALITY 120 MG/ML SOAJ Inject into the skin.     montelukast (SINGULAIR) 10 MG tablet TAKE 1 TABLET BY MOUTH EVERY DAY 90 tablet 1   phentermine (ADIPEX-P) 37.5 MG tablet Take 1/2 tab po qam x 14 days, then one tab po qam 30 tablet 0   topiramate (TOPAMAX) 200 MG tablet Take 200 mg by mouth daily.     No current facility-administered medications on file prior to visit.    No Known Allergies  Objective: There were no vitals filed for this visit.  General: No acute distress, AAOx3  Right foot: Incision well healed, no gapping or dehiscence at surgical site, minimal scarring, mild swelling to right forefoot, no erythema, no warmth, no drainage, no signs of infection noted, Capillary fill time <3 seconds in all digits, gross sensation present via light touch to right foot.  55 dorsiflexion and 10 degrees plantarflexion noted at the right first MPJ upon weightbearing the hallux purchase the ground with no significant elevation.  No pain with calf compression.   Assessment and Plan:  Problem List Items Addressed This Visit   None Visit Diagnoses     Bunion, right foot    -  Primary   Relevant Orders   DG  Foot Complete Right   Capsulitis       S/P foot surgery, right       Right foot pain          -Patient seen and evaluated -X-rays consistent with postoperative status osteotomy sites healed hardware intact -Continue with physical therapy as scheduled -Advised patient to to continue with good supportive shoes and recommended custom functional foot orthotics -Patient to return as scheduled for casting of orthotics with Marcelyn Ditty, DPM

## 2021-07-30 ENCOUNTER — Ambulatory Visit: Payer: BC Managed Care – PPO | Attending: Sports Medicine | Admitting: Physical Therapy

## 2021-07-30 ENCOUNTER — Encounter: Payer: Self-pay | Admitting: Physical Therapy

## 2021-07-30 DIAGNOSIS — M9903 Segmental and somatic dysfunction of lumbar region: Secondary | ICD-10-CM | POA: Diagnosis not present

## 2021-07-30 DIAGNOSIS — R2681 Unsteadiness on feet: Secondary | ICD-10-CM | POA: Insufficient documentation

## 2021-07-30 DIAGNOSIS — R262 Difficulty in walking, not elsewhere classified: Secondary | ICD-10-CM | POA: Insufficient documentation

## 2021-07-30 DIAGNOSIS — M5386 Other specified dorsopathies, lumbar region: Secondary | ICD-10-CM | POA: Diagnosis not present

## 2021-07-30 DIAGNOSIS — M9905 Segmental and somatic dysfunction of pelvic region: Secondary | ICD-10-CM | POA: Diagnosis not present

## 2021-07-30 DIAGNOSIS — M6281 Muscle weakness (generalized): Secondary | ICD-10-CM | POA: Diagnosis not present

## 2021-07-30 DIAGNOSIS — R2689 Other abnormalities of gait and mobility: Secondary | ICD-10-CM | POA: Diagnosis not present

## 2021-07-30 DIAGNOSIS — M5414 Radiculopathy, thoracic region: Secondary | ICD-10-CM | POA: Diagnosis not present

## 2021-07-30 NOTE — Therapy (Addendum)
[place], [place], [address] Phone: [phone]   Fax:  [phone]  Physical Therapy Treatment/Discharge  Patient Details  Name: Wendy Parks MRN: [id number] Date of Birth: [birth year] Referring Provider (PT): Landis Martins, [place]   Encounter Date: 07/30/2021   PT End of Session - 07/30/21 1150     Visit Number 6    Number of Visits 9    Date for PT Re-Evaluation 07/30/21    Authorization Type BCBS    Authorization Time Period FOTO v6, v10    PT Start Time 1146    PT Stop Time 1224    PT Time Calculation (min) 38 min             Past Medical History:  Diagnosis Date   Kidney stone     Past Surgical History:  Procedure Laterality Date   BUNIONECTOMY  11/16/2020   ENDOMETRIAL ABLATION  01/2019   Dr. Mancel Bale   KIDNEY STONE SURGERY     TUBAL LIGATION  2012    There were no vitals filed for this visit.   Subjective Assessment - 07/30/21 1149     Subjective No pain in the foot . I am going to the chiropractor for my back later.    Currently in Pain? No/denies                [facility] PT Assessment - 07/30/21 0001       AROM   Overall AROM Comments Rt toe ext 23deg      PROM   Overall PROM Comments Rt toe extension 55deg             [facility] Adult PT Treatment/Exercise:  Therapeutic Exercise: -Rec bike L2 x 7 min - Bilat standing heel raise 2x12 - no UEE - Calf stretch 2x30sec  - Toe extension stretch 2x30sec  - Rt SLS rocking on DF and PF rocker board 4x30s  - Leg press heel raise Rt only 1x12 25lbs, 1x12 35lbs  - Reverse lunges Rt only 2x12  - review of toe flexion and extension stretches   Manual Therapy: - NA  Neuromuscular re-ed: -SLS right 45 sec - SLS on airex Rt only 3x30sec   Therapeutic Activity: - NA  Self-care/Home Management: - NA  Modalities - Gameready Rt ankle, 10 minutes, supine with Rt foot elevated               PT Short Term  Goals - 06/29/21 1859       PT SHORT TERM GOAL #1   Title Pt will report understanding and adherence to her HEP in order to promote independence in the management of her primary sxs.    Baseline HEP provided at eval    Time 4    Period Weeks    Status Achieved    Target Date 07/02/21               PT Long Term Goals - 07/30/21 1200       PT LONG TERM GOAL #1   Title Pt will achieve a FOT score of 71% in order to demonstrate improved functional ability as it relates to her great toe pain.    Baseline 52%; status 07/30/21: 72%    Time 8    Period Weeks    Status Achieved    Target Date 07/30/21      PT LONG TERM GOAL #2   Title Pt will achieve R great toe extension AROM of 45  degrees or greater in order to promote WNL gait with decreased pain.    Baseline 35 degrees; 42 degrees best    Time 8    Period Weeks    Status Partially Met    Target Date 07/30/21      PT LONG TERM GOAL #3   Title Pt will report ability to walk 30 minutes with 0-1/10 pain in order to return to walking for exercise without limitation due to pain.    Baseline Pt reports >4/10 pain after 15 minutes of walking; 07/30/21- status can work 8-9 hour shift with 2/10 pain- can walk for 30 minutes with 1-2/10 pain.    Time 8    Period Weeks    Status Partially Met      PT LONG TERM GOAL #4   Title Pt will achieve 10 full-depth squats with 0-1/10 pain in order to progress her independent LE strengthening program without limitation.    Baseline >4/10 pain with 1 squat; status 07/30/21- no pain with full depth squat x10    Time 8    Period Weeks    Status Achieved      PT LONG TERM GOAL #5   Title Pt will achieve R single leg stance of 15 seconds or longer in order to promote functional balance.    Time 8    Period Weeks    Status Achieved    Target Date 07/30/21                   Plan - 07/30/21 1213     Clinical Impression Statement Pt arrives reporting no pain. She is working up to 9  hour shifts with pain up to 2/10 at most. She is working on her AROM/ PROM of great toe and has 55 degrees passive and 42 at best AROM. She is independent with her HEP and feels like she is at point where she can discharge to  her HEP. She had follow up with MD yesterday and will be fitted for orthotics. She has met or nearly met all LTGS at this time. FOTO score improved beyond predicted outcome.    PT Treatment/Interventions ADLs/Self Care Home Management;Cryotherapy;Gait training;Stair training;Functional mobility training;Therapeutic activities;Therapeutic exercise;Balance training;Neuromuscular re-education;Manual techniques;Passive range of motion;Scar mobilization;Compression bandaging;Patient/family education;Taping;Vasopneumatic Device;Manual lymph drainage    PT Next Visit Plan discharge to [place]             Patient will benefit from skilled therapeutic intervention in order to improve the following deficits and impairments:  Difficulty walking, Decreased range of motion, Pain, Impaired flexibility, Decreased mobility, Decreased strength, Increased edema  Visit Diagnosis: Difficulty in walking, not elsewhere classified  Muscle weakness (generalized)  Other abnormalities of gait and mobility  Unsteadiness on feet     Problem List Patient Active Problem List   Diagnosis Date Noted   Allergic rhinitis 11/26/2020   Amenorrhea 11/26/2020   Benign essential hypertension 11/26/2020   Body mass index (BMI) 34.0-34.9, adult 11/26/2020   Obesity, unspecified 11/26/2020   Other specified counseling 11/26/2020   Dysuria 11/26/2020   Effusion of hand joint 11/26/2020   Elevated blood-pressure reading without diagnosis of hypertension 11/26/2020   Esophageal reflux 11/26/2020   Gastritis 11/26/2020   Neck pain 11/26/2020   Oligomenorrhea 11/26/2020   Other abnormal glucose 11/26/2020   Pain in joint, pelvic region and thigh 11/26/2020    Patellofemoral syndrome 11/26/2020   Abdominal pain 11/26/2020   Breast pain 11/26/2020  Chest pain 11/26/2020   Pelvic pain 11/26/2020   Pre-operative examination 11/26/2020   Screening for malignant neoplasm of cervix 11/26/2020   Sebaceous cyst 11/26/2020   Sinusitis 11/26/2020   Surgery follow-up examination 11/26/2020   Ureteral stone 11/26/2020   Vaginal discharge 11/26/2020   Nephrolithiasis 06/24/2019   Herpes simplex 12/25/2018   Fibroadenoma of right breast 06/06/2016   Cyst of ovary 12/14/2015   History of hypertension 12/14/2015   Patellofemoral syndrome of right knee 12/14/2015   Uterine leiomyoma 12/14/2015    Dorene Ar, PTA 07/30/2021, 12:28 PM  PHYSICAL THERAPY DISCHARGE SUMMARY  Visits from Start of Care: 6  Current functional level related to goals / functional outcomes: See goals above   Remaining deficits: See impression above   Education / Equipment: D/C education   Patient agrees to discharge. Patient goals were partially met. Patient is being discharged due to  independent with advanced HEP.  Gwendolyn Grant, PT, DPT, ATC 08/03/21 11:50 AM  [facility] [address] [place], [place], [address] Phone: [phone]   Fax:  [phone]  Name: Ndeye Tenorio MRN: [id number] Date of Birth: [birth year]

## 2021-08-04 ENCOUNTER — Ambulatory Visit: Payer: BC Managed Care – PPO

## 2021-08-04 ENCOUNTER — Other Ambulatory Visit: Payer: Self-pay

## 2021-08-04 DIAGNOSIS — M779 Enthesopathy, unspecified: Secondary | ICD-10-CM

## 2021-08-04 NOTE — Progress Notes (Signed)
SITUATION Reason for Consult: Evaluation for Bilateral Custom Foot Orthoses Patient / Caregiver Report: Patient is in need of foot orthotics  OBJECTIVE DATA: Patient History / Diagnosis: Capsulitis  Current or Previous Devices: None and no history  Foot Examination: Skin presentation:   Intact Ulcers & Callousing:   None and no history Toe / Foot Deformities:  Pes planus  Weight Bearing Presentation:  rectus Sensation:    Intact  ORTHOTIC RECOMMENDATION Recommended Device: 1x pair of custom functional foot orthotics  GOALS OF ORTHOSES - Reduce Pain - Prevent Foot Deformity - Prevent Progression of Further Foot Deformity - Relieve Pressure - Improve the Overall Biomechanical Function of the Foot and Lower Extremity.  ACTIONS PERFORMED Patient was casted for Foot Orthoses via crush box. Procedure was explained and patient tolerated procedure well. All questions were answered and concerns addressed.  PLAN Potential out of pocket cost was communicated to patient. Casts are to be sent to [facility] for fabrication. Patient is to be called for fitting when devices are ready.

## 2021-08-09 ENCOUNTER — Other Ambulatory Visit: Payer: Self-pay | Admitting: Sports Medicine

## 2021-08-09 DIAGNOSIS — Z9889 Other specified postprocedural states: Secondary | ICD-10-CM

## 2021-08-17 ENCOUNTER — Encounter: Payer: BC Managed Care – PPO | Admitting: Physical Therapy

## 2021-08-18 DIAGNOSIS — G518 Other disorders of facial nerve: Secondary | ICD-10-CM | POA: Diagnosis not present

## 2021-08-18 DIAGNOSIS — G43901 Migraine, unspecified, not intractable, with status migrainosus: Secondary | ICD-10-CM | POA: Diagnosis not present

## 2021-08-18 DIAGNOSIS — G43719 Chronic migraine without aura, intractable, without status migrainosus: Secondary | ICD-10-CM | POA: Diagnosis not present

## 2021-08-18 DIAGNOSIS — M791 Myalgia, unspecified site: Secondary | ICD-10-CM | POA: Diagnosis not present

## 2021-08-18 DIAGNOSIS — M542 Cervicalgia: Secondary | ICD-10-CM | POA: Diagnosis not present

## 2021-08-20 ENCOUNTER — Other Ambulatory Visit: Payer: Self-pay | Admitting: Specialist

## 2021-08-20 DIAGNOSIS — G43719 Chronic migraine without aura, intractable, without status migrainosus: Secondary | ICD-10-CM

## 2021-08-24 ENCOUNTER — Ambulatory Visit: Payer: BC Managed Care – PPO | Admitting: Internal Medicine

## 2021-08-24 ENCOUNTER — Encounter: Payer: Self-pay | Admitting: Internal Medicine

## 2021-08-24 VITALS — BP 112/86 | HR 99 | Ht 65.8 in | Wt 209.6 lb

## 2021-08-24 DIAGNOSIS — G8929 Other chronic pain: Secondary | ICD-10-CM

## 2021-08-24 DIAGNOSIS — M5441 Lumbago with sciatica, right side: Secondary | ICD-10-CM

## 2021-08-24 DIAGNOSIS — E6609 Other obesity due to excess calories: Secondary | ICD-10-CM

## 2021-08-24 DIAGNOSIS — Z6834 Body mass index (BMI) 34.0-34.9, adult: Secondary | ICD-10-CM

## 2021-08-24 MED ORDER — PHENTERMINE HCL 37.5 MG PO TABS: 37.5000 mg | ORAL_TABLET | Freq: Every day | ORAL | 1 refills | Status: DC

## 2021-08-24 NOTE — Progress Notes (Signed)
I,Katawbba Wiggins,acting as a Education administrator for Maximino Greenland, MD.,have documented all relevant documentation on the behalf of Maximino Greenland, MD,as directed by  Maximino Greenland, MD while in the presence of Maximino Greenland, MD.  This visit occurred during the SARS-CoV-2 public health emergency.  Safety protocols were in place, including screening questions prior to the visit, additional usage of staff PPE, and extensive cleaning of exam room while observing appropriate contact time as indicated for disinfecting solutions.  Subjective:     Patient ID: Wendy Parks , female    DOB: 1980/10/08 , 41 y.o.   MRN: [id number]   No chief complaint on file.   HPI  The patient is here today for a follow-up on her weight.  She was prescribed phentermine at her Nov visit. She states she did not take it because she was also under the care of a chiropractor for back pain.     Past Medical History:  Diagnosis Date   Kidney stone      Family History  Problem Relation Age of Onset   Healthy Mother    Dementia Father    Cancer Father      Current Outpatient Medications:    phentermine (ADIPEX-P) 37.5 MG tablet, Take 1 tablet (37.5 mg total) by mouth daily before breakfast., Disp: 30 tablet, Rfl: 1   albuterol (VENTOLIN HFA) 108 (90 Base) MCG/ACT inhaler, Inhale 2 puffs into the lungs every 6 (six) hours as needed for wheezing or shortness of breath., Disp: 18 g, Rfl: 1   baclofen (LIORESAL) 10 MG tablet, Take 10 mg by mouth 2 (two) times daily as needed., Disp: , Rfl:    benzonatate (TESSALON PERLES) 100 MG capsule, Take 1 capsule (100 mg total) by mouth every 6 (six) hours as needed., Disp: 30 capsule, Rfl: 1   BOTOX 100 units SOLR injection, , Disp: , Rfl:    cyclobenzaprine (FLEXERIL) 5 MG tablet, One tab po qpm prn back pain, Disp: 30 tablet, Rfl: 0   EMGALITY 120 MG/ML SOAJ, Inject into the skin., Disp: , Rfl:    montelukast (SINGULAIR) 10 MG tablet, TAKE 1 TABLET BY MOUTH EVERY DAY, Disp: 90  tablet, Rfl: 1   topiramate (TOPAMAX) 200 MG tablet, Take 200 mg by mouth daily., Disp: , Rfl:    No Known Allergies   Review of Systems  Constitutional: Negative.   Respiratory: Negative.    Cardiovascular: Negative.   Gastrointestinal: Negative.   Musculoskeletal:  Positive for back pain.  Neurological: Negative.   Psychiatric/Behavioral: Negative.      Today's Vitals   08/24/21 1452  BP: 112/86  Pulse: 99  Weight: 209 lb 9.6 oz (95.1 kg)  Height: 5' 5.8" (1.671 m)  PainSc: 5   PainLoc: Head   Body mass index is 34.04 kg/m.  Wt Readings from Last 3 Encounters:  08/24/21 209 lb 9.6 oz (95.1 kg)  07/12/21 203 lb 6.4 oz (92.3 kg)  01/28/21 197 lb (89.4 kg)    BP Readings from Last 3 Encounters:  08/24/21 112/86  07/12/21 128/68  01/28/21 138/78    Objective:  Physical Exam Vitals and nursing note reviewed.  Constitutional:      Appearance: Normal appearance.  HENT:     Head: Normocephalic and atraumatic.     Nose:     Comments: Masked     Mouth/Throat:     Comments: Masked  Eyes:     Extraocular Movements: Extraocular movements intact.  Cardiovascular:     Rate  and Rhythm: Normal rate and regular rhythm.     Heart sounds: Normal heart sounds.  Pulmonary:     Effort: Pulmonary effort is normal.     Breath sounds: Normal breath sounds.  Musculoskeletal:     Cervical back: Normal range of motion.  Skin:    General: Skin is warm.  Neurological:     General: No focal deficit present.     Mental Status: She is alert.  Psychiatric:        Mood and Affect: Mood normal.        Behavior: Behavior normal.        Assessment And Plan:     1. Class 1 obesity due to excess calories without serious comorbidity with body mass index (BMI) of 34.0 to 34.9 in adult Comments: She will c/w phentermine 37.5mg  daily. Reminded to stay well hydrated, she will gradually increase her daily activity level. She will f/u in 8 weeks.   2. Chronic bilateral low back pain with  right-sided sciatica Comments: She was given SalonPas patches to apply to affected area. She will c/w Chiro for now, does not wish to see Ortho at this time.   Patient was given opportunity to ask questions. Patient verbalized understanding of the plan and was able to repeat key elements of the plan. All questions were answered to their satisfaction.   I, Maximino Greenland, MD, have reviewed all documentation for this visit. The documentation on 08/24/21 for the exam, diagnosis, procedures, and orders are all accurate and complete.   IF YOU HAVE BEEN REFERRED TO A SPECIALIST, IT MAY TAKE 1-2 WEEKS TO SCHEDULE/PROCESS THE REFERRAL. IF YOU HAVE NOT HEARD FROM US/SPECIALIST IN TWO WEEKS, PLEASE GIVE [place] A CALL AT [phone] X [phone].   THE PATIENT IS ENCOURAGED TO PRACTICE SOCIAL DISTANCING DUE TO THE COVID-19 PANDEMIC.

## 2021-08-24 NOTE — Patient Instructions (Signed)

## 2021-09-01 ENCOUNTER — Ambulatory Visit: Payer: BC Managed Care – PPO

## 2021-09-01 ENCOUNTER — Other Ambulatory Visit: Payer: Self-pay

## 2021-09-01 DIAGNOSIS — M779 Enthesopathy, unspecified: Secondary | ICD-10-CM

## 2021-09-01 DIAGNOSIS — M21611 Bunion of right foot: Secondary | ICD-10-CM

## 2021-09-01 NOTE — Progress Notes (Signed)
SITUATION: Reason for Visit: Fitting and Delivery of Custom Fabricated Foot Orthoses Patient Report: Patient reports comfort and is satisfied with device.  OBJECTIVE DATA: Patient History / Diagnosis:     ICD-10-CM   1. Capsulitis  M77.9     2. Bunion, right foot  M21.611       Provided Device:  Custom foot orthotics  GOAL OF ORTHOSIS - Improve gait - Decrease energy expenditure - Improve Balance - Provide Triplanar stability of foot complex - Facilitate motion  ACTIONS PERFORMED Patient was fit with custom insoles trimmed to shoe last. Patient tolerated fittign procedure. Device was modified as follows to better fit patient: - Toe plate was trimmed to shoe last  Patient was provided with verbal and written instruction and demonstration regarding donning, doffing, wear, care, proper fit, function, purpose, cleaning, and use of the orthosis and in all related precautions and risks and benefits regarding the orthosis.  Patient was also provided with verbal instruction regarding how to report any failures or malfunctions of the orthosis and necessary follow up care. Patient was also instructed to contact our office regarding any change in status that may affect the function of the orthosis.  Patient demonstrated independence with proper donning, doffing, and fit and verbalized understanding of all instructions.  PLAN: Patient is to follow up in one week or as necessary (PRN). All questions were answered and concerns addressed. Plan of care was discussed with and agreed upon by the patient.

## 2021-09-05 ENCOUNTER — Ambulatory Visit
Admission: RE | Admit: 2021-09-05 | Discharge: 2021-09-05 | Disposition: A | Discharge status: Home / Self Care | Payer: BC Managed Care – PPO | Source: Ambulatory Visit | Attending: Specialist | Admitting: Specialist

## 2021-09-05 ENCOUNTER — Other Ambulatory Visit: Payer: Self-pay

## 2021-09-05 DIAGNOSIS — G43719 Chronic migraine without aura, intractable, without status migrainosus: Secondary | ICD-10-CM

## 2021-09-05 DIAGNOSIS — G930 Cerebral cysts: Secondary | ICD-10-CM | POA: Diagnosis not present

## 2021-09-15 DIAGNOSIS — G43719 Chronic migraine without aura, intractable, without status migrainosus: Secondary | ICD-10-CM | POA: Diagnosis not present

## 2021-09-15 DIAGNOSIS — G43901 Migraine, unspecified, not intractable, with status migrainosus: Secondary | ICD-10-CM | POA: Diagnosis not present

## 2021-10-07 DIAGNOSIS — G518 Other disorders of facial nerve: Secondary | ICD-10-CM | POA: Diagnosis not present

## 2021-10-07 DIAGNOSIS — M791 Myalgia, unspecified site: Secondary | ICD-10-CM | POA: Diagnosis not present

## 2021-10-07 DIAGNOSIS — G43901 Migraine, unspecified, not intractable, with status migrainosus: Secondary | ICD-10-CM | POA: Diagnosis not present

## 2021-10-07 DIAGNOSIS — G43719 Chronic migraine without aura, intractable, without status migrainosus: Secondary | ICD-10-CM | POA: Diagnosis not present

## 2021-10-07 DIAGNOSIS — M542 Cervicalgia: Secondary | ICD-10-CM | POA: Diagnosis not present

## 2021-10-25 ENCOUNTER — Ambulatory Visit: Payer: BC Managed Care – PPO | Admitting: Internal Medicine

## 2021-11-18 DIAGNOSIS — M791 Myalgia, unspecified site: Secondary | ICD-10-CM | POA: Diagnosis not present

## 2021-11-18 DIAGNOSIS — G43719 Chronic migraine without aura, intractable, without status migrainosus: Secondary | ICD-10-CM | POA: Diagnosis not present

## 2021-11-18 DIAGNOSIS — M542 Cervicalgia: Secondary | ICD-10-CM | POA: Diagnosis not present

## 2021-11-18 DIAGNOSIS — G43901 Migraine, unspecified, not intractable, with status migrainosus: Secondary | ICD-10-CM | POA: Diagnosis not present

## 2021-11-18 DIAGNOSIS — G518 Other disorders of facial nerve: Secondary | ICD-10-CM | POA: Diagnosis not present

## 2021-12-14 ENCOUNTER — Ambulatory Visit: Payer: BC Managed Care – PPO | Admitting: Internal Medicine

## 2021-12-21 DIAGNOSIS — G43719 Chronic migraine without aura, intractable, without status migrainosus: Secondary | ICD-10-CM | POA: Diagnosis not present

## 2022-01-06 DIAGNOSIS — M791 Myalgia, unspecified site: Secondary | ICD-10-CM | POA: Diagnosis not present

## 2022-01-06 DIAGNOSIS — G43719 Chronic migraine without aura, intractable, without status migrainosus: Secondary | ICD-10-CM | POA: Diagnosis not present

## 2022-01-06 DIAGNOSIS — G518 Other disorders of facial nerve: Secondary | ICD-10-CM | POA: Diagnosis not present

## 2022-01-06 DIAGNOSIS — G43901 Migraine, unspecified, not intractable, with status migrainosus: Secondary | ICD-10-CM | POA: Diagnosis not present

## 2022-01-06 DIAGNOSIS — M542 Cervicalgia: Secondary | ICD-10-CM | POA: Diagnosis not present

## 2022-01-14 ENCOUNTER — Other Ambulatory Visit: Payer: Self-pay | Admitting: Internal Medicine

## 2022-02-03 DIAGNOSIS — Z01419 Encounter for gynecological examination (general) (routine) without abnormal findings: Secondary | ICD-10-CM | POA: Diagnosis not present

## 2022-02-03 DIAGNOSIS — N946 Dysmenorrhea, unspecified: Secondary | ICD-10-CM | POA: Diagnosis not present

## 2022-02-03 DIAGNOSIS — Z124 Encounter for screening for malignant neoplasm of cervix: Secondary | ICD-10-CM | POA: Diagnosis not present

## 2022-02-03 DIAGNOSIS — Z1231 Encounter for screening mammogram for malignant neoplasm of breast: Secondary | ICD-10-CM | POA: Diagnosis not present

## 2022-02-03 DIAGNOSIS — Z113 Encounter for screening for infections with a predominantly sexual mode of transmission: Secondary | ICD-10-CM | POA: Diagnosis not present

## 2022-02-03 DIAGNOSIS — N920 Excessive and frequent menstruation with regular cycle: Secondary | ICD-10-CM | POA: Diagnosis not present

## 2022-02-16 DIAGNOSIS — M542 Cervicalgia: Secondary | ICD-10-CM | POA: Diagnosis not present

## 2022-02-16 DIAGNOSIS — G43901 Migraine, unspecified, not intractable, with status migrainosus: Secondary | ICD-10-CM | POA: Diagnosis not present

## 2022-02-16 DIAGNOSIS — M791 Myalgia, unspecified site: Secondary | ICD-10-CM | POA: Diagnosis not present

## 2022-02-16 DIAGNOSIS — G518 Other disorders of facial nerve: Secondary | ICD-10-CM | POA: Diagnosis not present

## 2022-02-16 DIAGNOSIS — G43719 Chronic migraine without aura, intractable, without status migrainosus: Secondary | ICD-10-CM | POA: Diagnosis not present

## 2022-03-15 DIAGNOSIS — G43719 Chronic migraine without aura, intractable, without status migrainosus: Secondary | ICD-10-CM | POA: Diagnosis not present

## 2022-04-01 DIAGNOSIS — D259 Leiomyoma of uterus, unspecified: Secondary | ICD-10-CM | POA: Diagnosis not present

## 2022-04-01 DIAGNOSIS — N920 Excessive and frequent menstruation with regular cycle: Secondary | ICD-10-CM | POA: Diagnosis not present

## 2022-04-07 DIAGNOSIS — G43719 Chronic migraine without aura, intractable, without status migrainosus: Secondary | ICD-10-CM | POA: Diagnosis not present

## 2022-04-07 DIAGNOSIS — G518 Other disorders of facial nerve: Secondary | ICD-10-CM | POA: Diagnosis not present

## 2022-04-07 DIAGNOSIS — M791 Myalgia, unspecified site: Secondary | ICD-10-CM | POA: Diagnosis not present

## 2022-04-07 DIAGNOSIS — M542 Cervicalgia: Secondary | ICD-10-CM | POA: Diagnosis not present

## 2022-05-19 DIAGNOSIS — G518 Other disorders of facial nerve: Secondary | ICD-10-CM | POA: Diagnosis not present

## 2022-05-19 DIAGNOSIS — M542 Cervicalgia: Secondary | ICD-10-CM | POA: Diagnosis not present

## 2022-05-19 DIAGNOSIS — G43901 Migraine, unspecified, not intractable, with status migrainosus: Secondary | ICD-10-CM | POA: Diagnosis not present

## 2022-05-19 DIAGNOSIS — M791 Myalgia, unspecified site: Secondary | ICD-10-CM | POA: Diagnosis not present

## 2022-06-15 DIAGNOSIS — G43719 Chronic migraine without aura, intractable, without status migrainosus: Secondary | ICD-10-CM | POA: Diagnosis not present

## 2022-06-30 ENCOUNTER — Ambulatory Visit: Payer: Self-pay

## 2022-06-30 NOTE — Patient Instructions (Signed)
Visit Information  Thank you for taking time to visit with me today. Please don't hesitate to contact me if I can be of assistance to you.   Following are the goals we discussed today:   Goals Addressed             This Visit's Progress    COMPLETED: Care Coordination Activities       Care Coordination Interventions: SDoH screening performed - no acute resource challenges identified at this time Discussed the patient does experience difficulty affording medication at times - she utilizes coupon cards as available Education provided on the role of the care coordination team Instructed the patient to contact her primary care provider as needed          If you are experiencing a Mental Health or [place] or need someone to talk to, please call 1-800-273-TALK (toll free, 24 hour hotline)  Patient verbalizes understanding of instructions and care plan provided today and agrees to view in [place]. Active MyChart status and patient understanding of how to access instructions and care plan via MyChart confirmed with patient.     No further follow up required: Please contact your primary care provider as needed  Daneen Schick, Arita Miss, CDP Social Worker, Certified Dementia Practitioner [place] [facility] [phone]

## 2022-06-30 NOTE — Patient Outreach (Signed)
  Care Coordination   Initial Visit Note   06/30/2022 Name: Catilyn Boggus MRN: [id number] DOB: [birth year]  Lashauna Arpin is a 41 y.o. year old female who sees Glendale Chard, MD for primary care. I spoke with  [name] by phone today.  What matters to the patients health and wellness today?  No concerns, doing well at this time    Goals Addressed             This Visit's Progress    COMPLETED: Care Coordination Activities       Care Coordination Interventions: SDoH screening performed - no acute resource challenges identified at this time Discussed the patient does experience difficulty affording medication at times - she utilizes coupon cards as available Education provided on the role of the care coordination team Instructed the patient to contact her primary care provider as needed         SDOH assessments and interventions completed:  Yes  SDOH Interventions Today    Flowsheet Row Most Recent Value  SDOH Interventions   Food Insecurity Interventions Intervention Not Indicated  Housing Interventions Intervention Not Indicated  Transportation Interventions Intervention Not Indicated  Utilities Interventions Intervention Not Indicated        Care Coordination Interventions Activated:  Yes  Care Coordination Interventions:  Yes, provided   Follow up plan: No further intervention required.   Encounter Outcome:  Pt. Visit Completed   Daneen Schick, BSW, CDP Social Worker, Certified Dementia Practitioner [facility]  Care Coordination [phone]

## 2022-07-05 DIAGNOSIS — G43901 Migraine, unspecified, not intractable, with status migrainosus: Secondary | ICD-10-CM | POA: Diagnosis not present

## 2022-07-05 DIAGNOSIS — G43719 Chronic migraine without aura, intractable, without status migrainosus: Secondary | ICD-10-CM | POA: Diagnosis not present

## 2022-07-05 DIAGNOSIS — M542 Cervicalgia: Secondary | ICD-10-CM | POA: Diagnosis not present

## 2022-07-05 DIAGNOSIS — G518 Other disorders of facial nerve: Secondary | ICD-10-CM | POA: Diagnosis not present

## 2022-07-05 DIAGNOSIS — M791 Myalgia, unspecified site: Secondary | ICD-10-CM | POA: Diagnosis not present

## 2022-07-28 ENCOUNTER — Other Ambulatory Visit: Payer: Self-pay | Admitting: Internal Medicine

## 2022-08-08 ENCOUNTER — Encounter: Payer: Self-pay | Admitting: Internal Medicine

## 2022-08-08 ENCOUNTER — Ambulatory Visit (INDEPENDENT_AMBULATORY_CARE_PROVIDER_SITE_OTHER): Payer: BC Managed Care – PPO | Admitting: Internal Medicine

## 2022-08-08 VITALS — BP 124/80 | HR 72 | Temp 98.6°F | Ht 65.0 in | Wt 216.0 lb

## 2022-08-08 DIAGNOSIS — E6609 Other obesity due to excess calories: Secondary | ICD-10-CM

## 2022-08-08 DIAGNOSIS — E785 Hyperlipidemia, unspecified: Secondary | ICD-10-CM | POA: Diagnosis not present

## 2022-08-08 DIAGNOSIS — Z0000 Encounter for general adult medical examination without abnormal findings: Secondary | ICD-10-CM | POA: Diagnosis not present

## 2022-08-08 DIAGNOSIS — R7309 Other abnormal glucose: Secondary | ICD-10-CM | POA: Diagnosis not present

## 2022-08-08 DIAGNOSIS — R635 Abnormal weight gain: Secondary | ICD-10-CM

## 2022-08-08 DIAGNOSIS — Z6835 Body mass index (BMI) 35.0-35.9, adult: Secondary | ICD-10-CM | POA: Diagnosis not present

## 2022-08-08 MED ORDER — CYCLOBENZAPRINE HCL 5 MG PO TABS: 5.0000 mg | ORAL_TABLET | Freq: Three times a day (TID) | ORAL | 0 refills | Status: DC | PRN

## 2022-08-08 MED ORDER — WEGOVY 1 MG/0.5ML ~~LOC~~ SOAJ: 1.0000 mg | SUBCUTANEOUS | 0 refills | Status: DC

## 2022-08-08 NOTE — Patient Instructions (Signed)

## 2022-08-08 NOTE — Progress Notes (Signed)
Rich Brave [place],acting as a Education administrator for Maximino Greenland, MD.,have documented all relevant documentation on the behalf of Maximino Greenland, MD,as directed by  Maximino Greenland, MD while in the presence of Maximino Greenland, MD.   Subjective:     Patient ID: Wendy Parks , female    DOB: 03-08-1981 , 41 y.o.   MRN: [id number]   Chief Complaint  Patient presents with   Annual Exam    HPI  She is here today for a full physical examination.  She is followed by Dr. Mancel Bale for her Gyn care. She was last seen June 2023. 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 She denies having any chest pain, shortness of breath and palpitations.      Past Medical History:  Diagnosis Date   Kidney stone      Family History  Problem Relation Age of Onset   Healthy Mother    Dementia Father    Cancer Father      Current Outpatient Medications:    albuterol (VENTOLIN HFA) 108 (90 Base) MCG/ACT inhaler, Inhale 2 puffs into the lungs every 6 (six) hours as needed for wheezing or shortness of breath., Disp: 18 g, Rfl: 1   baclofen (LIORESAL) 10 MG tablet, Take 10 mg by mouth 2 (two) times daily as needed., Disp: , Rfl:    BOTOX 100 units SOLR injection, , Disp: , Rfl:    cyclobenzaprine (FLEXERIL) 5 MG tablet, Take 1 tablet (5 mg total) by mouth 3 (three) times daily as needed for muscle spasms., Disp: 30 tablet, Rfl: 0   montelukast (SINGULAIR) 10 MG tablet, TAKE 1 TABLET BY MOUTH EVERY DAY, Disp: 90 tablet, Rfl: 1   Semaglutide-Weight Management (WEGOVY) 1 MG/0.5ML SOAJ, Inject 1 mg into the skin once a week., Disp: 2 mL, Rfl: 0   topiramate (TOPAMAX) 200 MG tablet, Take 200 mg by mouth daily., Disp: , Rfl:    AJOVY 225 MG/1.5ML SOAJ, Inject into the skin., Disp: , Rfl:    rizatriptan (MAXALT) 10 MG tablet, 1 TABLET AS NEEDED MIGRAINE MAY REPEAT ONCE AFTER 2 HOURS, Disp: , Rfl:    No Known Allergies    The patient states she uses tubal ligation for birth control. Last  LMP was Patient's last menstrual period was 07/23/2022 (exact date).. Negative for Dysmenorrhea and Positive for Menorrhagia. Negative for: breast discharge, breast lump(s), breast pain and breast self exam. Associated symptoms include abnormal vaginal bleeding. Pertinent negatives include abnormal bleeding (hematology), anxiety, decreased libido, depression, difficulty falling sleep, dyspareunia, history of infertility, nocturia, sexual dysfunction, sleep disturbances, urinary incontinence, urinary urgency, vaginal discharge and vaginal itching. Diet regular.The patient states her exercise level is  intermittent.   . The patient's tobacco use is:  Social History   Tobacco Use  Smoking Status Never  Smokeless Tobacco Never  . She has been exposed to passive smoke. The patient's alcohol use is:  Social History   Substance and Sexual Activity  Alcohol Use Yes   Comment: occasionally   Past Surgical History:  Procedure Laterality Date   BUNIONECTOMY  11/16/2020   ENDOMETRIAL ABLATION  01/2019   Dr. Mancel Bale   KIDNEY STONE SURGERY     TUBAL LIGATION  2012     Review of Systems  Constitutional: Negative.   HENT: Negative.    Eyes: Negative.   Respiratory: Negative.    Cardiovascular: Negative.   Gastrointestinal: Negative.   Endocrine: Negative.   Genitourinary:  Negative.   Musculoskeletal: Negative.   Skin: Negative.   Allergic/Immunologic: Negative.   Neurological:  Positive for headaches.       She does have h/o migraines.  Hematological: Negative.   Psychiatric/Behavioral: Negative.       Today's Vitals   08/08/22 0956  BP: 124/80  Pulse: 72  Temp: 98.6 F (37 C)  Weight: 216 lb (98 kg)  Height: _0  (1.651 m)  PainSc: 4   PainLoc: Back   Body mass index is 35.94 kg/m.  Wt Readings from Last 3 Encounters:  08/08/22 216 lb (98 kg)  08/24/21 209 lb 9.6 oz (95.1 kg)  07/12/21 203 lb 6.4 oz (92.3 kg)     Objective:  Physical Exam Vitals and nursing note  reviewed.  Constitutional:      Appearance: Normal appearance.  HENT:     Head: Normocephalic and atraumatic.     Right Ear: Tympanic membrane, ear canal and external ear normal.     Left Ear: Tympanic membrane, ear canal and external ear normal.     Nose:     Comments: Masked     Mouth/Throat:     Comments: Masked  Eyes:     Extraocular Movements: Extraocular movements intact.     Conjunctiva/sclera: Conjunctivae normal.     Pupils: Pupils are equal, round, and reactive to light.  Cardiovascular:     Rate and Rhythm: Normal rate and regular rhythm.     Pulses: Normal pulses.     Heart sounds: Normal heart sounds.  Pulmonary:     Effort: Pulmonary effort is normal.     Breath sounds: Normal breath sounds.  Chest:  Breasts:    Tanner Score is 5.     Right: Normal.     Left: Normal.  Abdominal:     General: Abdomen is flat. Bowel sounds are normal.     Palpations: Abdomen is soft.  Genitourinary:    Comments: deferred Musculoskeletal:        General: Normal range of motion.     Cervical back: Normal range of motion and neck supple.  Skin:    General: Skin is warm and dry.  Neurological:     General: No focal deficit present.     Mental Status: She is alert and oriented to person, place, and time.  Psychiatric:        Mood and Affect: Mood normal.        Behavior: Behavior normal.      Assessment And Plan:     1. Encounter for general adult medical examination w/o abnormal findings Comments: A full exam was performed.  She is encouraged to perform monthly self breast exams.  PATIENT IS ADVISED TO GET 30-45 MINUTES REGULAR EXERCISE NO LESS THAN FOUR TO FIVE DAYS PER WEEK - BOTH WEIGHTBEARING EXERCISES AND AEROBIC ARE RECOMMENDED.  PATIENT IS ADVISED TO FOLLOW A HEALTHY DIET WITH AT LEAST SIX FRUITS/VEGGIES PER DAY, DECREASE INTAKE OF RED MEAT, AND TO INCREASE FISH INTAKE TO TWO DAYS PER WEEK.  MEATS/FISH SHOULD NOT BE FRIED, BAKED OR BROILED IS PREFERABLE.  IT IS ALSO  IMPORTANT TO CUT BACK ON YOUR SUGAR INTAKE. PLEASE AVOID ANYTHING WITH ADDED SUGAR, CORN SYRUP OR OTHER SWEETENERS. IF YOU MUST USE A SWEETENER, YOU CAN TRY STEVIA. IT IS ALSO IMPORTANT TO AVOID ARTIFICIALLY SWEETENERS AND DIET BEVERAGES. LASTLY, I SUGGEST WEARING SPF 50 SUNSCREEN ON EXPOSED PARTS AND ESPECIALLY WHEN IN THE DIRECT SUNLIGHT FOR AN EXTENDED PERIOD OF TIME.  PLEASE  AVOID FAST FOOD RESTAURANTS AND INCREASE YOUR WATER INTAKE. - CBC - CMP14+EGFR - Lipid panel - Hemoglobin A1c  2. Weight gain Comments: She has gained 7lbs in the past year. Admits she is not exercising as much as she has in the past. She is interested in pharmaceutical treatments.  3. Class 2 obesity due to excess calories without serious comorbidity with body mass index (BMI) of 35.0 to 35.9 in adult Comments: She is encouraged to aim for at least 150 minutes of exercise per week, while initially striving for BMI<30 to decrease cardiac risk. We discussed the use of Wegovy to help her achieve her weight loss goals. She denies known family history of thyroid cancer.  Patient has not met goal of at least 5% of body weight loss with comprehensive lifestyle modifications alone in the past 3-6 months. Pharmacotherapy is appropriate to pursue as augmentation. I will send rx Wegovy to start PA process. She understands concerns for product shortages. If approved, she will come back for nurse visit to go over [facility] pen teaching. Will discuss common side effects including nausea, diarrhea, dyspepsia, decreased appetite, and fatigue at that time. She agrees to rtn every 8 weeks for f/u. Will also counsel patient on reducing meal size and how to titrate medication to minimize side effects.   - Semaglutide-Weight Management (WEGOVY) 1 MG/0.5ML SOAJ; Inject 1 mg into the skin once a week.  Dispense: 2 mL; Refill: 0  Patient was given opportunity to ask questions. Patient verbalized understanding of the plan and was able to repeat key  elements of the plan. All questions were answered to their satisfaction.   I, Maximino Greenland, MD, have reviewed all documentation for this visit. The documentation on 08/08/22 for the exam, diagnosis, procedures, and orders are all accurate and complete.   THE PATIENT IS ENCOURAGED TO PRACTICE SOCIAL DISTANCING DUE TO THE COVID-19 PANDEMIC.

## 2022-08-09 LAB — LIPID PANEL
Chol/HDL Ratio: 3.3 ratio (ref 0.0–4.4)
Cholesterol, Total: 196 mg/dL (ref 100–199)
HDL: 59 mg/dL (ref >39)
LDL Chol Calc (NIH): 116 mg/dL — ABNORMAL HIGH (ref 0–99)
Triglycerides: 119 mg/dL (ref 0–149)
VLDL Cholesterol Cal: 21 mg/dL (ref 5–40)

## 2022-08-09 LAB — CMP14+EGFR
ALT: 15 IU/L (ref 0–32)
AST: 21 IU/L (ref 0–40)
Albumin/Globulin Ratio: 1.5 (ref 1.2–2.2)
Albumin: 4.5 g/dL (ref 3.9–4.9)
Alkaline Phosphatase: 89 IU/L (ref 44–121)
BUN/Creatinine Ratio: 14 (ref 9–23)
BUN: 10 mg/dL (ref 6–24)
Bilirubin Total: 0.3 mg/dL (ref 0.0–1.2)
CO2: 21 mmol/L (ref 20–29)
Calcium: 9.2 mg/dL (ref 8.7–10.2)
Chloride: 102 mmol/L (ref 96–106)
Creatinine, Ser: 0.72 mg/dL (ref 0.57–1.00)
Globulin, Total: 3.1 g/dL (ref 1.5–4.5)
Glucose: 91 mg/dL (ref 70–99)
Potassium: 3.9 mmol/L (ref 3.5–5.2)
Sodium: 140 mmol/L (ref 134–144)
Total Protein: 7.6 g/dL (ref 6.0–8.5)
eGFR: 108 mL/min/{1.73_m2} (ref >59)

## 2022-08-09 LAB — HEMOGLOBIN A1C
Est. average glucose Bld gHb Est-mCnc: 131 mg/dL
Hgb A1c MFr Bld: 6.2 % — ABNORMAL HIGH (ref 4.8–5.6)

## 2022-08-09 LAB — CBC
Hematocrit: 40.1 % (ref 34.0–46.6)
Hemoglobin: 13.4 g/dL (ref 11.1–15.9)
MCH: 29.9 pg (ref 26.6–33.0)
MCHC: 33.4 g/dL (ref 31.5–35.7)
MCV: 90 fL (ref 79–97)
Platelets: 347 10*3/uL (ref 150–450)
RBC: 4.48 x10E6/uL (ref 3.77–5.28)
RDW: 12.6 % (ref 11.7–15.4)
WBC: 11.7 10*3/uL — ABNORMAL HIGH (ref 3.4–10.8)

## 2022-08-13 ENCOUNTER — Encounter: Payer: Self-pay | Admitting: Internal Medicine

## 2022-08-17 DIAGNOSIS — M791 Myalgia, unspecified site: Secondary | ICD-10-CM | POA: Diagnosis not present

## 2022-08-17 DIAGNOSIS — G518 Other disorders of facial nerve: Secondary | ICD-10-CM | POA: Diagnosis not present

## 2022-08-17 DIAGNOSIS — M542 Cervicalgia: Secondary | ICD-10-CM | POA: Diagnosis not present

## 2022-08-17 DIAGNOSIS — G43901 Migraine, unspecified, not intractable, with status migrainosus: Secondary | ICD-10-CM | POA: Diagnosis not present

## 2022-08-29 ENCOUNTER — Other Ambulatory Visit: Payer: Self-pay | Admitting: Internal Medicine

## 2022-08-29 MED ORDER — PHENTERMINE HCL 37.5 MG PO TABS: 37.5000 mg | ORAL_TABLET | Freq: Every day | ORAL | 0 refills | Status: DC

## 2022-09-12 DIAGNOSIS — G43719 Chronic migraine without aura, intractable, without status migrainosus: Secondary | ICD-10-CM | POA: Diagnosis not present

## 2022-09-20 ENCOUNTER — Other Ambulatory Visit: Payer: Self-pay | Admitting: Internal Medicine

## 2022-09-20 MED ORDER — ALBUTEROL SULFATE HFA 108 (90 BASE) MCG/ACT IN AERS: 2.0000 | INHALATION_SPRAY | Freq: Four times a day (QID) | RESPIRATORY_TRACT | 3 refills | Status: DC | PRN

## 2022-09-21 ENCOUNTER — Telehealth (INDEPENDENT_AMBULATORY_CARE_PROVIDER_SITE_OTHER): Payer: BC Managed Care – PPO | Admitting: Internal Medicine

## 2022-09-21 ENCOUNTER — Encounter: Payer: Self-pay | Admitting: Internal Medicine

## 2022-09-21 DIAGNOSIS — E6609 Other obesity due to excess calories: Secondary | ICD-10-CM | POA: Diagnosis not present

## 2022-09-21 DIAGNOSIS — U071 COVID-19: Secondary | ICD-10-CM | POA: Diagnosis not present

## 2022-09-21 DIAGNOSIS — Z6835 Body mass index (BMI) 35.0-35.9, adult: Secondary | ICD-10-CM

## 2022-09-21 MED ORDER — WEGOVY 1 MG/0.5ML ~~LOC~~ SOAJ: 1.0000 mg | SUBCUTANEOUS | 0 refills | Status: DC

## 2022-09-21 MED ORDER — PAXLOVID (300/100) 20 X 150 MG & 10 X 100MG PO TBPK: ORAL_TABLET | ORAL | 0 refills | Status: DC

## 2022-09-21 NOTE — Progress Notes (Signed)
Virtual Visit via Video   This visit type was conducted due to national recommendations for restrictions regarding the COVID-19 Pandemic (e.g. social distancing) in an effort to limit this patient's exposure and mitigate transmission in our community.  Due to her co-morbid illnesses, this patient is at least at moderate risk for complications without adequate follow up.  This format is felt to be most appropriate for this patient at this time.  All issues noted in this document were discussed and addressed.  A limited physical exam was performed with this format.    This visit type was conducted due to national recommendations for restrictions regarding the COVID-19 Pandemic (e.g. social distancing) in an effort to limit this patient's exposure and mitigate transmission in our community.  Patients identity confirmed using two different identifiers.  This format is felt to be most appropriate for this patient at this time.  All issues noted in this document were discussed and addressed.  No physical exam was performed (except for noted visual exam findings with Video Visits).    Date:  09/21/2022   ID:  Wendy Parks, DOB August 29, 1980, MRN [id number]  Patient Location:  Home  Provider location:   Office    Chief Complaint:  "I have COVID"  History of Present Illness:    Wendy Parks is a 42 y.o. female who presents via video conferencing for a telehealth visit today.    The patient does have symptoms concerning for COVID-19 infection (fever, chills, cough, or new shortness of breath).   She presents today for virtual visit. She prefers this method of contact due to COVID-19 pandemic.  She called to request virtual visit because she tested for positive for COVID yesterday. On Monday, she developed sore throat, runny nose, and headache. She also reports having body aches, no chills. She has been unable to check her temperature. She states her husband started to feel sick last Friday. He also  has COVID.         Past Medical History:  Diagnosis Date   Kidney stone    Past Surgical History:  Procedure Laterality Date   BUNIONECTOMY  11/16/2020   ENDOMETRIAL ABLATION  01/2019   Dr. Mancel Bale   KIDNEY STONE SURGERY     TUBAL LIGATION  2012     Current Meds  Medication Sig   nirmatrelvir & ritonavir (PAXLOVID, 300/100,) 20 x 150 MG & 10 x '100MG'$  TBPK Use as directed, last GFR 108 in Dec 2023     Allergies:   Patient has no known allergies.   Social History   Tobacco Use   Smoking status: Never   Smokeless tobacco: Never  Vaping Use   Vaping Use: Never used  Substance Use Topics   Alcohol use: Yes    Comment: occasionally   Drug use: Never     Family Hx: The patient's family history includes Cancer in her father; Dementia in her father; Healthy in her mother.  ROS:   Please see the history of present illness.    Review of Systems  Constitutional: Negative.   HENT:  Positive for congestion.   Respiratory:  Positive for cough and shortness of breath.   Cardiovascular: Negative.   Gastrointestinal: Negative.   Neurological: Negative.   Psychiatric/Behavioral: Negative.      All other systems reviewed and are negative.   Labs/Other Tests and Data Reviewed:    Recent Labs: 08/08/2022: ALT 15; BUN 10; Creatinine, Ser 0.72; Hemoglobin 13.4; Platelets 347; Potassium 3.9; Sodium 140  Recent Lipid Panel Lab Results  Component Value Date/Time   CHOL 196 08/08/2022 10:46 AM   TRIG 119 08/08/2022 10:46 AM   HDL 59 08/08/2022 10:46 AM   CHOLHDL 3.3 08/08/2022 10:46 AM   LDLCALC 116 (H) 08/08/2022 10:46 AM    Wt Readings from Last 3 Encounters:  08/08/22 216 lb (98 kg)  08/24/21 209 lb 9.6 oz (95.1 kg)  07/12/21 203 lb 6.4 oz (92.3 kg)     Exam:    Vital Signs:  There were no vitals taken for this visit.    Physical Exam Vitals and nursing note reviewed.  HENT:     Head: Normocephalic and atraumatic.     Comments: Sounds congested Eyes:      Extraocular Movements: Extraocular movements intact.  Pulmonary:     Effort: Pulmonary effort is normal.     Comments: Able to speak in full sentences w/o cough or labored breathing Musculoskeletal:     Cervical back: Normal range of motion.  Neurological:     Mental Status: She is alert and oriented to person, place, and time.  Psychiatric:        Mood and Affect: Affect normal.     ASSESSMENT & PLAN:    1. COVID-19 Comments: She requests antiviral treatment, last GFR 108 in Dec 2023. I will send rx Paxlovid to local pharmacy. She is encouraged to take the full course. She was given inhaler to use prn. If SOB worsens, she agrees to go to ER.  Advised patient to take Vitamin C, D, Zinc.  Keep yourself hydrated with a lot of water and rest. Take Delsym for cough and Mucinex as needed. Take Tylenol or pain reliever every 4-6 hours as needed for pain/fever/body ache. She may use Delsym prn cough.  You can also take OTC oscillococcinum, a homeopathic remedy, to help with your symptoms.  Melatonin taken nightly may also be helpful.  Educated patient if symptoms get worse or if she experiences any SOB, chest pain or pain in her legs to seek immediate emergency care. Continue to monitor your oxygen levels. Call us if you have any questions. Quarantine for 5 days and wear mask for another five days around others.      - Temperature monitoring; Future  2. Class 2 obesity due to excess calories without serious comorbidity with body mass index (BMI) of 35.0 to 35.9 in adult Comments: I will send rx Wegovy to local pharmacy to see if it can be approved. She has previously denied family history of thyroid cancer. - Semaglutide-Weight Management (WEGOVY) 1 MG/0.5ML SOAJ; Inject 1 mg into the skin once a week.  Dispense: 2 mL; Refill: 0   COVID-19 Education: The signs and symptoms of COVID-19 were discussed with the patient and how to seek care for testing (follow up with PCP or arrange E-visit).  The  importance of social distancing was discussed today.  Patient Risk:   After full review of this patients clinical status, I feel that they are at least moderate risk at this time.  Time:   Today, I have spent 10 minutes with the patient with telehealth technology discussing above diagnoses.     Medication Adjustments/Labs and Tests Ordered: Current medicines are reviewed at length with the patient today.  Concerns regarding medicines are outlined above.   Tests Ordered: No orders of the defined types were placed in this encounter.   Medication Changes: Meds ordered this encounter  Medications   nirmatrelvir & ritonavir (PAXLOVID, 300/100,) 20  x 150 MG & 10 x '100MG'$  TBPK    Sig: Use as directed, last GFR 108 in Dec 2023    Dispense:  30 tablet    Refill:  0   Semaglutide-Weight Management (WEGOVY) 1 MG/0.5ML SOAJ    Sig: Inject 1 mg into the skin once a week.    Dispense:  2 mL    Refill:  0    Disposition:  Follow up prn  Signed, Maximino Greenland, MD

## 2022-09-23 ENCOUNTER — Telehealth: Payer: Self-pay

## 2022-09-23 ENCOUNTER — Encounter: Payer: Self-pay | Admitting: Internal Medicine

## 2022-09-23 NOTE — Telephone Encounter (Signed)
Called patient in regard to [place] on my chart for symptoms of cough. Patient stated that she reached out to her PCP this morning for additional care advice. Advise patient that care instructions on cough symptoms will be sent to her mychart to review. Patient verbal understanding.

## 2022-10-05 DIAGNOSIS — M791 Myalgia, unspecified site: Secondary | ICD-10-CM | POA: Diagnosis not present

## 2022-10-05 DIAGNOSIS — G43901 Migraine, unspecified, not intractable, with status migrainosus: Secondary | ICD-10-CM | POA: Diagnosis not present

## 2022-10-05 DIAGNOSIS — G43719 Chronic migraine without aura, intractable, without status migrainosus: Secondary | ICD-10-CM | POA: Diagnosis not present

## 2022-10-05 DIAGNOSIS — M542 Cervicalgia: Secondary | ICD-10-CM | POA: Diagnosis not present

## 2022-10-05 DIAGNOSIS — G518 Other disorders of facial nerve: Secondary | ICD-10-CM | POA: Diagnosis not present

## 2022-10-19 ENCOUNTER — Ambulatory Visit: Payer: Self-pay | Admitting: Internal Medicine

## 2022-11-17 DIAGNOSIS — M542 Cervicalgia: Secondary | ICD-10-CM | POA: Diagnosis not present

## 2022-11-17 DIAGNOSIS — G43719 Chronic migraine without aura, intractable, without status migrainosus: Secondary | ICD-10-CM | POA: Diagnosis not present

## 2022-11-17 DIAGNOSIS — G518 Other disorders of facial nerve: Secondary | ICD-10-CM | POA: Diagnosis not present

## 2022-11-17 DIAGNOSIS — M791 Myalgia, unspecified site: Secondary | ICD-10-CM | POA: Diagnosis not present

## 2022-11-21 ENCOUNTER — Encounter: Payer: Self-pay | Admitting: Podiatry

## 2022-11-21 ENCOUNTER — Ambulatory Visit (INDEPENDENT_AMBULATORY_CARE_PROVIDER_SITE_OTHER): Payer: BC Managed Care – PPO

## 2022-11-21 ENCOUNTER — Ambulatory Visit: Payer: BC Managed Care – PPO | Admitting: Podiatry

## 2022-11-21 DIAGNOSIS — B351 Tinea unguium: Secondary | ICD-10-CM

## 2022-11-21 DIAGNOSIS — M205X1 Other deformities of toe(s) (acquired), right foot: Secondary | ICD-10-CM

## 2022-11-21 DIAGNOSIS — M21619 Bunion of unspecified foot: Secondary | ICD-10-CM

## 2022-11-21 DIAGNOSIS — R600 Localized edema: Secondary | ICD-10-CM

## 2022-11-21 DIAGNOSIS — M21611 Bunion of right foot: Secondary | ICD-10-CM

## 2022-11-21 MED ORDER — DEXAMETHASONE SODIUM PHOSPHATE 120 MG/30ML IJ SOLN
4.0000 mg | Freq: Once | INTRAMUSCULAR | Status: AC
  Administered 2022-11-21: 4 mg via INTRA_ARTICULAR

## 2022-11-21 MED ORDER — MELOXICAM 15 MG PO TABS: 15.0000 mg | ORAL_TABLET | Freq: Every day | ORAL | 0 refills | Status: DC

## 2022-11-21 NOTE — Progress Notes (Signed)
  Subjective:  Patient ID: Wendy Parks, female    DOB: 1980-12-24,   MRN: [id number]  Chief Complaint  Patient presents with   Foot Pain    Patient is here today to discuss right foot swelling associated with pain. Patient is 2 yrs post op of a bunionectomy in her right foot.    42 y.o. female presents for concern of right foot swelling and pain that has continued since her right foot bunionectomy with Dr. Cannon Kettle 2 years ago. 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 Relates she wears compression to help and takes ibuprofen when needed.  . Denies any other pedal complaints. Denies n/v/f/c.   Past Medical History:  Diagnosis Date   Kidney stone     Objective:  Physical Exam: Vascular: DP/PT pulses 2/4 bilateral. CFT <3 seconds. Normal hair growth on digits. No edema.  Skin. No lacerations or abrasions bilateral feet. Localized edema noted to dorsum of first and second metatarsal area on the right. Right second digit nail thickened and discolored with subungual debrsi.  Musculoskeletal: MMT 5/5 bilateral lower extremities in DF, PF, Inversion and Eversion. Deceased ROM in DF of ankle joint.  Tender to right great first metatrsal area and first interspace some pain over first MPJ and some pain with ROM of the joint. Most pain in the interspace Neurological: Sensation intact to light touch.   Assessment:   1. Onychomycosis   2. Bunion   3. Hallux limitus of right foot   4. Localized edema      Plan:  Patient was evaluated and treated and all questions answered. -Xrays reviewed. Hardware intact and toe well aligned. Some decreased in joint space noted at first MPJ.  -Discussed hallux limitus and post-op bunion pain and swelling.  and  treatement options; conservative and  Surgical management; risks, benefits, alternatives discussed. All patient's questions answered. -Rx Meloxicam provided. -Patient opted for injection of first MTPJ. After verbal consent, injected into  right/left 1st MTPJ for symptomatic relief 1cc marcaine plain, 1cc dexamethasone without complication.   -Recommend continue with good supportive shoes and inserts. Discussed stiff soled shoes and use of carbon fiber foot plate.   -Examined patient -Discussed treatment options for painful dystrophic nails  -Clinical picture and Fungal culture was obtained by removing a portion of the hard nail itself from each of the involved toenails using a sterile nail nipper and sent to [facility] lab. Patient tolerated the biopsy procedure well without discomfort or need for anesthesia.  -Discussed fungal nail treatment options including oral, topical, and laser treatments.  -Patient to return in 6 weeks for follow up evaluation and discussion of fungal culture results or sooner if symptoms worsen.   Lorenda Peck, DPM

## 2022-12-26 ENCOUNTER — Ambulatory Visit: Payer: BC Managed Care – PPO | Admitting: Internal Medicine

## 2023-01-03 DIAGNOSIS — G43719 Chronic migraine without aura, intractable, without status migrainosus: Secondary | ICD-10-CM | POA: Diagnosis not present

## 2023-01-04 DIAGNOSIS — G43901 Migraine, unspecified, not intractable, with status migrainosus: Secondary | ICD-10-CM | POA: Diagnosis not present

## 2023-01-04 DIAGNOSIS — G43719 Chronic migraine without aura, intractable, without status migrainosus: Secondary | ICD-10-CM | POA: Diagnosis not present

## 2023-01-04 DIAGNOSIS — M542 Cervicalgia: Secondary | ICD-10-CM | POA: Diagnosis not present

## 2023-01-04 DIAGNOSIS — M791 Myalgia, unspecified site: Secondary | ICD-10-CM | POA: Diagnosis not present

## 2023-01-04 DIAGNOSIS — G518 Other disorders of facial nerve: Secondary | ICD-10-CM | POA: Diagnosis not present

## 2023-01-09 ENCOUNTER — Ambulatory Visit: Payer: BC Managed Care – PPO | Admitting: Podiatry

## 2023-02-01 ENCOUNTER — Ambulatory Visit: Payer: BC Managed Care – PPO | Admitting: Podiatry

## 2023-02-06 DIAGNOSIS — F4312 Post-traumatic stress disorder, chronic: Secondary | ICD-10-CM | POA: Diagnosis not present

## 2023-02-07 ENCOUNTER — Ambulatory Visit: Payer: BC Managed Care – PPO | Admitting: Internal Medicine

## 2023-02-13 DIAGNOSIS — F4312 Post-traumatic stress disorder, chronic: Secondary | ICD-10-CM | POA: Diagnosis not present

## 2023-02-15 DIAGNOSIS — M542 Cervicalgia: Secondary | ICD-10-CM | POA: Diagnosis not present

## 2023-02-15 DIAGNOSIS — G43901 Migraine, unspecified, not intractable, with status migrainosus: Secondary | ICD-10-CM | POA: Diagnosis not present

## 2023-02-15 DIAGNOSIS — M791 Myalgia, unspecified site: Secondary | ICD-10-CM | POA: Diagnosis not present

## 2023-02-15 DIAGNOSIS — G518 Other disorders of facial nerve: Secondary | ICD-10-CM | POA: Diagnosis not present

## 2023-02-16 DIAGNOSIS — N920 Excessive and frequent menstruation with regular cycle: Secondary | ICD-10-CM | POA: Diagnosis not present

## 2023-02-16 DIAGNOSIS — Z1231 Encounter for screening mammogram for malignant neoplasm of breast: Secondary | ICD-10-CM | POA: Diagnosis not present

## 2023-02-16 DIAGNOSIS — Z01419 Encounter for gynecological examination (general) (routine) without abnormal findings: Secondary | ICD-10-CM | POA: Diagnosis not present

## 2023-02-16 DIAGNOSIS — N941 Unspecified dyspareunia: Secondary | ICD-10-CM | POA: Diagnosis not present

## 2023-02-16 DIAGNOSIS — N898 Other specified noninflammatory disorders of vagina: Secondary | ICD-10-CM | POA: Diagnosis not present

## 2023-02-16 DIAGNOSIS — E559 Vitamin D deficiency, unspecified: Secondary | ICD-10-CM | POA: Diagnosis not present

## 2023-02-16 DIAGNOSIS — Z113 Encounter for screening for infections with a predominantly sexual mode of transmission: Secondary | ICD-10-CM | POA: Diagnosis not present

## 2023-02-16 DIAGNOSIS — Z8742 Personal history of other diseases of the female genital tract: Secondary | ICD-10-CM | POA: Diagnosis not present

## 2023-02-18 ENCOUNTER — Encounter (HOSPITAL_COMMUNITY): Payer: Self-pay

## 2023-02-18 ENCOUNTER — Emergency Department (HOSPITAL_COMMUNITY): Payer: BC Managed Care – PPO

## 2023-02-18 ENCOUNTER — Emergency Department (HOSPITAL_COMMUNITY)
Admission: EM | Admit: 2023-02-18 | Discharge: 2023-02-18 | Disposition: A | Discharge status: Home / Self Care | Payer: BC Managed Care – PPO | Attending: Emergency Medicine | Admitting: Emergency Medicine

## 2023-02-18 DIAGNOSIS — R109 Unspecified abdominal pain: Secondary | ICD-10-CM | POA: Diagnosis not present

## 2023-02-18 DIAGNOSIS — N200 Calculus of kidney: Secondary | ICD-10-CM | POA: Insufficient documentation

## 2023-02-18 DIAGNOSIS — N202 Calculus of kidney with calculus of ureter: Secondary | ICD-10-CM | POA: Diagnosis not present

## 2023-02-18 DIAGNOSIS — D72829 Elevated white blood cell count, unspecified: Secondary | ICD-10-CM | POA: Diagnosis not present

## 2023-02-18 LAB — URINALYSIS, ROUTINE W REFLEX MICROSCOPIC
Bilirubin Urine: NEGATIVE
Glucose, UA: NEGATIVE mg/dL
Ketones, ur: NEGATIVE mg/dL
Leukocytes,Ua: NEGATIVE
Nitrite: NEGATIVE
Protein, ur: NEGATIVE mg/dL
RBC / HPF: >50 RBC/hpf (ref 0–5)
Specific Gravity, Urine: 1.015 (ref 1.005–1.030)
pH: 6 (ref 5.0–8.0)

## 2023-02-18 LAB — BASIC METABOLIC PANEL
Anion gap: 6 (ref 5–15)
BUN: 11 mg/dL (ref 6–20)
CO2: 20 mmol/L — ABNORMAL LOW (ref 22–32)
Calcium: 8.5 mg/dL — ABNORMAL LOW (ref 8.9–10.3)
Chloride: 111 mmol/L (ref 98–111)
Creatinine, Ser: 1 mg/dL (ref 0.44–1.00)
GFR, Estimated: >60 mL/min (ref >60)
Glucose, Bld: 96 mg/dL (ref 70–99)
Potassium: 3.5 mmol/L (ref 3.5–5.1)
Sodium: 137 mmol/L (ref 135–145)

## 2023-02-18 LAB — LIPASE, BLOOD: Lipase: 31 U/L (ref 11–51)

## 2023-02-18 LAB — CBC
HCT: 40.1 % (ref 36.0–46.0)
Hemoglobin: 13 g/dL (ref 12.0–15.0)
MCH: 30 pg (ref 26.0–34.0)
MCHC: 32.4 g/dL (ref 30.0–36.0)
MCV: 92.4 fL (ref 80.0–100.0)
Platelets: 321 10*3/uL (ref 150–400)
RBC: 4.34 MIL/uL (ref 3.87–5.11)
RDW: 12.2 % (ref 11.5–15.5)
WBC: 14.8 10*3/uL — ABNORMAL HIGH (ref 4.0–10.5)
nRBC: 0 % (ref 0.0–0.2)

## 2023-02-18 LAB — DIFFERENTIAL
Abs Immature Granulocytes: 0.06 10*3/uL (ref 0.00–0.07)
Basophils Absolute: 0.1 10*3/uL (ref 0.0–0.1)
Basophils Relative: 1 %
Eosinophils Absolute: 0.3 10*3/uL (ref 0.0–0.5)
Eosinophils Relative: 2 %
Immature Granulocytes: 0 %
Lymphocytes Relative: 35 %
Lymphs Abs: 5.1 10*3/uL — ABNORMAL HIGH (ref 0.7–4.0)
Monocytes Absolute: 0.8 10*3/uL (ref 0.1–1.0)
Monocytes Relative: 5 %
Neutro Abs: 8.4 10*3/uL — ABNORMAL HIGH (ref 1.7–7.7)
Neutrophils Relative %: 57 %

## 2023-02-18 LAB — HCG, SERUM, QUALITATIVE: Preg, Serum: NEGATIVE

## 2023-02-18 MED ORDER — HYDROCODONE-ACETAMINOPHEN 5-325 MG PO TABS: 1.0000 | ORAL_TABLET | ORAL | 0 refills | Status: DC | PRN

## 2023-02-18 MED ORDER — TAMSULOSIN HCL 0.4 MG PO CAPS: 0.4000 mg | ORAL_CAPSULE | Freq: Every day | ORAL | 0 refills | Status: DC

## 2023-02-18 MED ORDER — ONDANSETRON 4 MG PO TBDP: 4.0000 mg | ORAL_TABLET | Freq: Three times a day (TID) | ORAL | 0 refills | Status: DC | PRN

## 2023-02-18 MED ORDER — KETOROLAC TROMETHAMINE 15 MG/ML IJ SOLN
15.0000 mg | Freq: Once | INTRAMUSCULAR | Status: AC
  Administered 2023-02-18: 15 mg via INTRAVENOUS
  Filled 2023-02-18: qty 1

## 2023-02-18 MED ORDER — MORPHINE SULFATE (PF) 4 MG/ML IV SOLN
4.0000 mg | Freq: Once | INTRAVENOUS | Status: AC
  Administered 2023-02-18: 4 mg via INTRAVENOUS
  Filled 2023-02-18: qty 1

## 2023-02-18 MED ORDER — LACTATED RINGERS IV BOLUS
1000.0000 mL | Freq: Once | INTRAVENOUS | Status: AC
  Administered 2023-02-18: 1000 mL via INTRAVENOUS

## 2023-02-18 MED ORDER — ONDANSETRON HCL 4 MG/2ML IJ SOLN
4.0000 mg | Freq: Once | INTRAMUSCULAR | Status: AC
  Administered 2023-02-18: 4 mg via INTRAVENOUS
  Filled 2023-02-18: qty 2

## 2023-02-18 MED ORDER — HYDROMORPHONE HCL 1 MG/ML IJ SOLN
1.0000 mg | Freq: Once | INTRAMUSCULAR | Status: AC
  Administered 2023-02-18: 1 mg via INTRAVENOUS
  Filled 2023-02-18: qty 1

## 2023-02-18 NOTE — ED Notes (Signed)
Iv needs to be updated

## 2023-02-18 NOTE — ED Provider Notes (Addendum)
[place] EMERGENCY DEPARTMENT AT [facility] Provider Note   CSN: [id number] Arrival date & time: 02/18/23  1233     History  Chief Complaint  Patient presents with   Flank Pain    Wendy Parks is a 42 y.o. female.  42 year old female with past medical history significant for kidney stones presents today for concern of right-sided flank pain started this morning.  No associated dysuria or hematuria.  She states this does feel similar to her prior episodes of kidney stones.  Endorses nausea and vomiting.  Has history of lithotripsy otherwise no history of abdominal surgeries.  Denies other complaints.  Rates pain as a 9/10.  The history is provided by the patient. No language interpreter was used.       Home Medications Prior to Admission medications   Medication Sig Start Date End Date Taking? Authorizing Provider  AJOVY 225 MG/1.5ML SOAJ Inject into the skin.    [provider]  albuterol (VENTOLIN HFA) 108 (90 Base) MCG/ACT inhaler Inhale 2 puffs into the lungs every 6 (six) hours as needed for wheezing or shortness of breath. 09/20/22   Dorothyann Peng, MD  baclofen (LIORESAL) 10 MG tablet Take 10 mg by mouth 2 (two) times daily as needed. 06/14/19   [provider]  BOTOX 100 units SOLR injection  12/23/20   [provider]  cyclobenzaprine (FLEXERIL) 5 MG tablet Take 1 tablet (5 mg total) by mouth 3 (three) times daily as needed for muscle spasms. 08/08/22   Dorothyann Peng, MD  meloxicam (MOBIC) 15 MG tablet Take 1 tablet (15 mg total) by mouth daily. 11/21/22   Louann Sjogren, DPM  montelukast (SINGULAIR) 10 MG tablet TAKE 1 TABLET BY MOUTH EVERY DAY 07/28/22   Dorothyann Peng, MD  nirmatrelvir & ritonavir (PAXLOVID, 300/100,) 20 x 150 MG & 10 x 100MG  TBPK Use as directed, last GFR 108 in Dec 2023 09/21/22   Dorothyann Peng, MD  phentermine (ADIPEX-P) 37.5 MG tablet Take 1 tablet (37.5 mg total) by mouth daily before breakfast. 08/29/22    Dorothyann Peng, MD  rizatriptan (MAXALT) 10 MG tablet 1 TABLET AS NEEDED MIGRAINE MAY REPEAT ONCE AFTER 2 HOURS    [provider]  Semaglutide-Weight Management (WEGOVY) 1 MG/0.5ML SOAJ Inject 1 mg into the skin once a week. 09/21/22   Dorothyann Peng, MD  topiramate (TOPAMAX) 200 MG tablet Take 200 mg by mouth daily. 11/11/20   [provider]      Allergies    Patient has no known allergies.    Review of Systems   Review of Systems  Constitutional:  Negative for fever.  Gastrointestinal:  Positive for nausea and vomiting. Negative for abdominal pain.  Genitourinary:  Positive for flank pain. Negative for dysuria and hematuria.  All other systems reviewed and are negative.   Physical Exam Updated Vital Signs BP (!) 148/103 (BP Location: Left Arm)   Pulse 80   Temp 98.4 F (36.9 C) (Oral)   SpO2 100%  Physical Exam Vitals and nursing note reviewed.  Constitutional:      General: She is not in acute distress.    Appearance: Normal appearance. She is not ill-appearing.  HENT:     Head: Normocephalic and atraumatic.     Nose: Nose normal.  Eyes:     Conjunctiva/sclera: Conjunctivae normal.  Cardiovascular:     Rate and Rhythm: Normal rate and regular rhythm.     Heart sounds: Normal heart sounds.  Pulmonary:  Effort: Pulmonary effort is normal. No respiratory distress.     Breath sounds: Normal breath sounds. No wheezing.  Abdominal:     General: There is no distension.     Palpations: Abdomen is soft.     Tenderness: There is no abdominal tenderness. There is right CVA tenderness. There is no left CVA tenderness or guarding.  Musculoskeletal:        General: No deformity. Normal range of motion.  Skin:    Findings: No rash.  Neurological:     General: No focal deficit present.     Mental Status: She is alert. Mental status is at baseline.     ED Results / Procedures / Treatments   Labs (all labs ordered are listed, but only abnormal results are  displayed) Labs Reviewed  URINALYSIS, ROUTINE W REFLEX MICROSCOPIC - Abnormal; Notable for the following components:      Result Value   APPearance HAZY (*)    Hgb urine dipstick LARGE (*)    Bacteria, UA RARE (*)    All other components within normal limits  BASIC METABOLIC PANEL - Abnormal; Notable for the following components:   CO2 20 (*)    Calcium 8.5 (*)    All other components within normal limits  CBC - Abnormal; Notable for the following components:   WBC 14.8 (*)    All other components within normal limits  DIFFERENTIAL - Abnormal; Notable for the following components:   Neutro Abs 8.4 (*)    Lymphs Abs 5.1 (*)    All other components within normal limits  HCG, SERUM, QUALITATIVE  LIPASE, BLOOD  URINALYSIS, ROUTINE W REFLEX MICROSCOPIC    EKG None  Radiology CT Renal Stone Study  Result Date: 02/18/2023 CLINICAL DATA:  Flank pain and leukocytosis. History of nephrolithiasis. EXAM: CT ABDOMEN AND PELVIS WITHOUT CONTRAST TECHNIQUE: Multidetector CT imaging of the abdomen and pelvis was performed following the standard protocol without IV contrast. RADIATION DOSE REDUCTION: This exam was performed according to the departmental dose-optimization program which includes automated exposure control, adjustment of the mA and/or kV according to patient size and/or use of iterative reconstruction technique. COMPARISON:  CT renal stone protocol May 05, 2018 FINDINGS: Lower chest: The visualized bibasilar lungs are clear. Normal heart size no pericardial effusion. Hepatobiliary: No focal liver abnormality is seen. No gallstones, gallbladder wall thickening, or biliary dilatation. Pancreas: Unremarkable. No pancreatic ductal dilatation or surrounding inflammatory changes. Spleen: Normal in size without focal abnormality. Adrenals/Urinary Tract: There is a 3 mm stone in the proximal right ureter (series 2, image 41), with mild upstream pelviectasis. However, no significant  caliectasis is noted. There are also multiple punctate nonobstructive left renal stones. Bilateral adrenal glands and bladder within normal limits. Stomach/Bowel: Stomach is within normal limits. Appendix appears normal. No evidence of bowel wall thickening, distention, or inflammatory changes. Vascular/Lymphatic: No significant vascular findings are present. No enlarged abdominal or pelvic lymph nodes. Reproductive: Unchanged lobulated appearance of the uterus, consistent with history of fibroids. Surgical clips noted in the right pelvis. Other: No abdominal wall hernia or abnormality. No abdominopelvic ascites. Musculoskeletal: No acute or significant osseous findings. IMPRESSION: 1. There is a 3 mm stone in the proximal right ureter, with mild upstream pelviectasis. However, no significant right hydronephrosis. 2. Nonobstructive left nephrolithiasis. Electronically Signed   By: Jacob Moores M.D.   On: 02/18/2023 17:42    Procedures Procedures    Medications Ordered in ED Medications  ketorolac (TORADOL) 15 MG/ML injection 15 mg (has  no administration in time range)  lactated ringers bolus 1,000 mL (0 mLs Intravenous Stopped 02/18/23 1800)  ondansetron (ZOFRAN) injection 4 mg (4 mg Intravenous Given 02/18/23 1625)  morphine (PF) 4 MG/ML injection 4 mg (4 mg Intravenous Given 02/18/23 1625)  HYDROmorphone (DILAUDID) injection 1 mg (1 mg Intravenous Given 02/18/23 1758)    ED Course/ Medical Decision Making/ A&P                             Medical Decision Making Amount and/or Complexity of Data Reviewed Labs: ordered. Radiology: ordered.  Risk Prescription drug management.   Medical Decision Making / ED Course   This patient presents to the ED for concern of flank pain, this involves an extensive number of treatment options, and is a complaint that carries with it a high risk of complications and morbidity.  The differential diagnosis includes nephrolithiasis, pyelonephritis,  UTI  MDM: 42 year old female presents today for concern of right-sided flank pain.  History of kidney stones.  Will obtain labs, provide pain control, and obtain CT scan.  Will provide fluids as well.  After 2 rounds of pain medication patient with good pain control.  CT scan revealed 3 mm right ureter stone.  No hydronephrosis.  UA without evidence of UTI.  CBC with leukocytosis with no left shift.  Without anemia.  Lipase within normal limit.  Symptoms well-managed.  Will discharge with pain control, Flomax, and Zofran.  Urology referral given.  No evidence of septic stone.  Renal function preserved.  Lab Tests: -I ordered, reviewed, and interpreted labs.   The pertinent results include:   Labs Reviewed  URINALYSIS, ROUTINE W REFLEX MICROSCOPIC - Abnormal; Notable for the following components:      Result Value   APPearance HAZY (*)    Hgb urine dipstick LARGE (*)    Bacteria, UA RARE (*)    All other components within normal limits  BASIC METABOLIC PANEL - Abnormal; Notable for the following components:   CO2 20 (*)    Calcium 8.5 (*)    All other components within normal limits  CBC - Abnormal; Notable for the following components:   WBC 14.8 (*)    All other components within normal limits  DIFFERENTIAL - Abnormal; Notable for the following components:   Neutro Abs 8.4 (*)    Lymphs Abs 5.1 (*)    All other components within normal limits  HCG, SERUM, QUALITATIVE  LIPASE, BLOOD  URINALYSIS, ROUTINE W REFLEX MICROSCOPIC      EKG  EKG Interpretation Date/Time:    Ventricular Rate:    PR Interval:    QRS Duration:    QT Interval:    QTC Calculation:   R Axis:      Text Interpretation:           Imaging Studies ordered: I ordered imaging studies including ct renal stone study I independently visualized and interpreted imaging. I agree with the radiologist interpretation   Medicines ordered and prescription drug management: Meds ordered this encounter   Medications   lactated ringers bolus 1,000 mL   ondansetron (ZOFRAN) injection 4 mg   morphine (PF) 4 MG/ML injection 4 mg   HYDROmorphone (DILAUDID) injection 1 mg   ketorolac (TORADOL) 15 MG/ML injection 15 mg    -I have reviewed the patients home medicines and have made adjustments as needed   Reevaluation: After the interventions noted above, I reevaluated the patient and found that  they have :improved  Co morbidities that complicate the patient evaluation  Past Medical History:  Diagnosis Date   Kidney stone       Dispostion: Patient is appropriate for discharge.  Discharged in stable condition.  Return precaution discussed.  Patient voices understanding and is in agreement with plan.  Final Clinical Impression(s) / ED Diagnoses Final diagnoses:  Nephrolithiasis    Rx / DC Orders ED Discharge Orders          Ordered    HYDROcodone-acetaminophen (NORCO/VICODIN) 5-325 MG tablet  Every 4 hours PRN        02/18/23 1850    ondansetron (ZOFRAN-ODT) 4 MG disintegrating tablet  Every 8 hours PRN        02/18/23 1850    tamsulosin (FLOMAX) 0.4 MG CAPS capsule  Daily        02/18/23 1850              Marita [place], PA-C 02/18/23 1852    [address] PA-C 02/18/23 1853    Rolan Bucco, MD 02/18/23 2120

## 2023-02-18 NOTE — Discharge Instructions (Signed)
Your workup today shows you have a 3 mm kidney stone.  He had good pain control in the emergency department.  I prescribed you few medication to help manage your symptoms at home.  In addition of these medications you can take 1000 mg of Tylenol every 8 hours, and 600 mg of ibuprofen every 6-8 hours.  Follow-up with urology.  For any concerning symptoms return to the emergency room.  These include fever, severe pain, or nausea and vomiting to the point you are unable to keep any food or drink down.

## 2023-02-22 DIAGNOSIS — F4312 Post-traumatic stress disorder, chronic: Secondary | ICD-10-CM | POA: Diagnosis not present

## 2023-02-24 DIAGNOSIS — N201 Calculus of ureter: Secondary | ICD-10-CM | POA: Diagnosis not present

## 2023-02-27 LAB — HM PAP SMEAR: HPV, high-risk: NEGATIVE

## 2023-02-28 DIAGNOSIS — F4312 Post-traumatic stress disorder, chronic: Secondary | ICD-10-CM | POA: Diagnosis not present

## 2023-03-01 DIAGNOSIS — G518 Other disorders of facial nerve: Secondary | ICD-10-CM | POA: Diagnosis not present

## 2023-03-01 DIAGNOSIS — M542 Cervicalgia: Secondary | ICD-10-CM | POA: Diagnosis not present

## 2023-03-01 DIAGNOSIS — G43901 Migraine, unspecified, not intractable, with status migrainosus: Secondary | ICD-10-CM | POA: Diagnosis not present

## 2023-03-01 DIAGNOSIS — M791 Myalgia, unspecified site: Secondary | ICD-10-CM | POA: Diagnosis not present

## 2023-03-02 ENCOUNTER — Ambulatory Visit (INDEPENDENT_AMBULATORY_CARE_PROVIDER_SITE_OTHER): Payer: BC Managed Care – PPO | Admitting: Internal Medicine

## 2023-03-02 ENCOUNTER — Encounter: Payer: Self-pay | Admitting: Internal Medicine

## 2023-03-02 VITALS — BP 150/100 | HR 74 | Temp 98.4°F | Ht 65.0 in | Wt 216.4 lb

## 2023-03-02 DIAGNOSIS — R030 Elevated blood-pressure reading, without diagnosis of hypertension: Secondary | ICD-10-CM | POA: Diagnosis not present

## 2023-03-02 DIAGNOSIS — F4322 Adjustment disorder with anxiety: Secondary | ICD-10-CM | POA: Insufficient documentation

## 2023-03-02 DIAGNOSIS — G43E19 Chronic migraine with aura, intractable, without status migrainosus: Secondary | ICD-10-CM | POA: Insufficient documentation

## 2023-03-02 DIAGNOSIS — Z6836 Body mass index (BMI) 36.0-36.9, adult: Secondary | ICD-10-CM

## 2023-03-02 DIAGNOSIS — F411 Generalized anxiety disorder: Secondary | ICD-10-CM | POA: Insufficient documentation

## 2023-03-02 DIAGNOSIS — F4320 Adjustment disorder, unspecified: Secondary | ICD-10-CM | POA: Insufficient documentation

## 2023-03-02 DIAGNOSIS — E6609 Other obesity due to excess calories: Secondary | ICD-10-CM

## 2023-03-02 DIAGNOSIS — R7309 Other abnormal glucose: Secondary | ICD-10-CM

## 2023-03-02 DIAGNOSIS — G43E09 Chronic migraine with aura, not intractable, without status migrainosus: Secondary | ICD-10-CM | POA: Insufficient documentation

## 2023-03-02 MED ORDER — CITALOPRAM HYDROBROMIDE 10 MG PO TABS: 10.0000 mg | ORAL_TABLET | Freq: Every day | ORAL | 2 refills | Status: DC

## 2023-03-02 NOTE — Progress Notes (Signed)
I,Victoria T Deloria Lair, CMA,acting as a Neurosurgeon for Gwynneth Aliment, MD.,have documented all relevant documentation on the behalf of Gwynneth Aliment, MD,as directed by  Gwynneth Aliment, MD while in the presence of Gwynneth Aliment, MD.  Subjective:  Patient ID: Wendy Parks , female    DOB: 25-May-1981 , 42 y.o.   MRN: [id number]  Chief Complaint  Patient presents with   Weight Check    HPI  The patient is here today for a follow-up on her weight.  She was prescribed phentermine at her Nov visit. She states  taking medication "sporadically" because she has not had the " time".  She also admits not exercising regularly.   She also would like to discuss anxiety medications. She states her husband lost his job on her birthday. Also, her mother that lives in [place] is on the verge of becoming homeless. She is having to help pay her mothers portion of rent. She reports she is now having panic attacks.      Past Medical History:  Diagnosis Date   Kidney stone      Family History  Problem Relation Age of Onset   Healthy Mother    Dementia Father    Cancer Father      Current Outpatient Medications:    AJOVY 225 MG/1.5ML SOAJ, Inject into the skin., Disp: , Rfl:    albuterol (VENTOLIN HFA) 108 (90 Base) MCG/ACT inhaler, Inhale 2 puffs into the lungs every 6 (six) hours as needed for wheezing or shortness of breath., Disp: 18 g, Rfl: 3   baclofen (LIORESAL) 10 MG tablet, Take 10 mg by mouth 2 (two) times daily as needed., Disp: , Rfl:    BOTOX 100 units SOLR injection, , Disp: , Rfl:    citalopram (CELEXA) 10 MG tablet, Take 1 tablet (10 mg total) by mouth daily., Disp: 30 tablet, Rfl: 2   cyclobenzaprine (FLEXERIL) 5 MG tablet, Take 1 tablet (5 mg total) by mouth 3 (three) times daily as needed for muscle spasms., Disp: 30 tablet, Rfl: 0   HYDROcodone-acetaminophen (NORCO/VICODIN) 5-325 MG tablet, Take 1-2 tablets by mouth every 4 (four) hours as needed for severe pain., Disp: 15 tablet,  Rfl: 0   meloxicam (MOBIC) 15 MG tablet, Take 1 tablet (15 mg total) by mouth daily., Disp: 30 tablet, Rfl: 0   montelukast (SINGULAIR) 10 MG tablet, TAKE 1 TABLET BY MOUTH EVERY DAY, Disp: 90 tablet, Rfl: 1   ondansetron (ZOFRAN-ODT) 4 MG disintegrating tablet, Take 1 tablet (4 mg total) by mouth every 8 (eight) hours as needed., Disp: 20 tablet, Rfl: 0   rizatriptan (MAXALT) 10 MG tablet, 1 TABLET AS NEEDED MIGRAINE MAY REPEAT ONCE AFTER 2 HOURS, Disp: , Rfl:    tamsulosin (FLOMAX) 0.4 MG CAPS capsule, Take 1 capsule (0.4 mg total) by mouth daily., Disp: 30 capsule, Rfl: 0   topiramate (TOPAMAX) 200 MG tablet, Take 200 mg by mouth daily., Disp: , Rfl:    amLODipine (NORVASC) 2.5 MG tablet, Take 1 tablet (2.5 mg total) by mouth at bedtime., Disp: 30 tablet, Rfl: 0   No Known Allergies   Review of Systems  Constitutional: Negative.   HENT: Negative.    Respiratory: Negative.    Cardiovascular: Negative.   Gastrointestinal: Negative.   Skin: Negative.   Neurological: Negative.   Psychiatric/Behavioral:  The patient is nervous/anxious.      Today's Vitals   03/02/23 1551 03/02/23 1618  BP: (!) 160/120 (!) 150/100  Pulse: 74  Temp: 98.4 F (36.9 C)   SpO2: 98%   Weight: 216 lb 6.4 oz (98.2 kg)   Height: 5\' 5"  (1.651 m)    Body mass index is 36.01 kg/m.  Wt Readings from Last 3 Encounters:  03/17/23 216 lb (98 kg)  03/02/23 216 lb 6.4 oz (98.2 kg)  08/08/22 216 lb (98 kg)    BP Readings from Last 3 Encounters:  03/17/23 (!) 130/102  03/02/23 (!) 150/100  02/18/23 119/68     Objective:  Physical Exam Vitals and nursing note reviewed.  Constitutional:      Appearance: Normal appearance.  HENT:     Head: Normocephalic and atraumatic.  Eyes:     Extraocular Movements: Extraocular movements intact.  Cardiovascular:     Rate and Rhythm: Normal rate and regular rhythm.     Heart sounds: Normal heart sounds.  Pulmonary:     Effort: Pulmonary effort is normal.      Breath sounds: Normal breath sounds.  Musculoskeletal:     Cervical back: Normal range of motion.  Skin:    General: Skin is warm.  Neurological:     General: No focal deficit present.     Mental Status: She is alert.  Psychiatric:        Mood and Affect: Mood normal.        Behavior: Behavior normal.         Assessment And Plan:  Body mass index (BMI) of 36.0-36.9 in adult Assessment & Plan: She is no longer on phentermine. Given her elevated blood pressure, this is no longer a weight loss option for her.  She is encouraged to strive for BMI less than 30 to decrease cardiac risk. Advised to aim for at least 150 minutes of exercise per week.    Elevated blood-pressure reading without diagnosis of hypertension Assessment & Plan: She is encouraged to cut back on her sodium intake. This is likely partially related to recent stressors. She agrees to rto in 2 weeks for NV. If needed, will start anti-hypertensives at that time.    Adjustment disorder with anxious mood Assessment & Plan: I will start her on citalopram 10mg  daily. Advised to take in the evenings. Possible side effects d/w patient. She agrees to rto in 4-6 weeks for re-evaluation.  She would also likely benefit from talk therapy as well.   Orders: -     TSH  Other abnormal glucose Assessment & Plan: Previous labs reviewed, her A1c has been elevated in the past. I will check an A1c today. Reminded to avoid refined sugars including sugary drinks/foods and processed meats including bacon, sausages and deli meats.    Orders: -     Hemoglobin A1c  Other orders -     Citalopram Hydrobromide; Take 1 tablet (10 mg total) by mouth daily.  Dispense: 30 tablet; Refill: 2  She is encouraged to strive for BMI less than 30 to decrease cardiac risk. Advised to aim for at least 150 minutes of exercise per week.   Return in about 6 weeks (around 04/13/2023), or med f/u, for 7/26 - NV  bp check.  Patient was given opportunity to  ask questions. Patient verbalized understanding of the plan and was able to repeat key elements of the plan. All questions were answered to their satisfaction.   I, Gwynneth Aliment, MD, have reviewed all documentation for this visit. The documentation on 03/02/23 for the exam, diagnosis, procedures, and orders are all accurate and complete.   IF  YOU HAVE BEEN REFERRED TO A SPECIALIST, IT MAY TAKE 1-2 WEEKS TO SCHEDULE/PROCESS THE REFERRAL. IF YOU HAVE NOT HEARD FROM US/SPECIALIST IN TWO WEEKS, PLEASE GIVE [place] A CALL AT [phone].   THE PATIENT IS ENCOURAGED TO PRACTICE SOCIAL DISTANCING DUE TO THE COVID-19 PANDEMIC.

## 2023-03-02 NOTE — Patient Instructions (Addendum)
Idiopathic Intracranial Hypertension  Idiopathic intracranial hypertension (IIH) is a condition that increases pressure around the brain. The fluid that surrounds the brain and spinal cord (cerebrospinal fluid, or CSF) increases and causes the pressure. Idiopathic means that the cause of this condition is not known. IIH affects the brain and spinal cord. If this condition is not treated, it can cause vision loss or blindness. What are the causes? The cause of this condition is not known. What increases the risk? The following factors may make you more likely to develop this condition: Being obese. Being a person who is female, between the ages of 36 and 72 years old, and who has not gone through menopause. Taking certain medicines, such as birth control, acne medicines, or steroids. What are the signs or symptoms? Symptoms of this condition include: Headaches. This is the most common symptom. Brief periods of total blindness. Double vision, blurred vision, or poor side (peripheral) vision. Pain in the shoulders or neck. Nausea and vomiting. A sound like rushing water or a pulsing sound within the ears (pulsatile tinnitus), or ringing in the ears. How is this diagnosed? This condition may be diagnosed based on: Your symptoms and medical history. Imaging tests of the brain, such as: CT scan. MRI. Magnetic resonance venogram (MRV) to check the veins. Diagnostic lumbar puncture. This is a procedure to remove and examine a sample of CSF. This procedure can determine whether your fluid pressure is too high. An eye exam to check for swelling or nerve damage in the eyes. How is this treated? Treatment for this condition depends on the symptoms. The goal of treatment is to decrease the pressure around your brain. Common treatments include: Weight loss through healthy eating, salt restriction, and exercise, if you are overweight. Medicines to decrease the production of CSF and lower the pressure  within your skull. Medicines to prevent or treat headaches. Other treatments may include: Surgery to place drains (shunts) in your brain to remove extra fluid. Lumbar puncture to remove extra CSF. Follow these instructions at home: If you are overweight or obese, work with your health care provider to lose weight. Take over-the-counter and prescription medicines only as told by your health care provider. Ask your health care provider if the medicine prescribed to you requires you to avoid driving or using machinery. Do not use any products that contain nicotine or tobacco. These products include cigarettes, chewing tobacco, and vaping devices, such as e-cigarettes. If you need help quitting, ask your health care provider. Keep all follow-up visits. Your health care provider will need to monitor you regularly. Contact a health care provider if: You have changes in your vision, such as: Double vision. Blurred vision. Poor peripheral vision. Get help right away if: You have any of the following symptoms and they get worse or do not get better: Headaches. Nausea. Vomiting. Sudden trouble seeing. This information is not intended to replace advice given to you by your health care provider. Make sure you discuss any questions you have with your health care provider. Document Revised: 01/04/2022 Document Reviewed: 12/14/2021 Elsevier Patient Education  2024 [employer] Inc.   Exercising to Lose Weight Getting regular exercise is important for everyone. It is especially important if you are overweight. Being overweight increases your risk of heart disease, stroke, diabetes, high blood pressure, and several types of cancer. Exercising, and reducing the calories you consume, can help you lose weight and improve fitness and health. Exercise can be moderate or vigorous intensity. To lose weight, most  people need to do a certain amount of moderate or vigorous-intensity exercise each week. How can  exercise affect me? You lose weight when you exercise enough to burn more calories than you eat. Exercise also reduces body fat and builds muscle. The more muscle you have, the more calories you burn. Exercise also: Improves mood. Reduces stress and tension. Improves your overall fitness, flexibility, and endurance. Increases bone strength. Moderate-intensity exercise  Moderate-intensity exercise is any activity that gets you moving enough to burn at least three times more energy (calories) than if you were sitting. Examples of moderate exercise include: Walking a mile in 15 minutes. Doing light yard work. Biking at an easy pace. Most people should get at least 150 minutes of moderate-intensity exercise a week to maintain their body weight. Vigorous-intensity exercise Vigorous-intensity exercise is any activity that gets you moving enough to burn at least six times more calories than if you were sitting. When you exercise at this intensity, you should be working hard enough that you are not able to carry on a conversation. Examples of vigorous exercise include: Running. Playing a team sport, such as football, basketball, and soccer. Jumping rope. Most people should get at least 75 minutes a week of vigorous exercise to maintain their body weight. What actions can I take to lose weight? The amount of exercise you need to lose weight depends on: Your age. The type of exercise. Any health conditions you have. Your overall physical ability. Talk to your health care provider about how much exercise you need and what types of activities are safe for you. Nutrition  Make changes to your diet as told by your health care provider or diet and nutrition specialist (dietitian). This may include: Eating fewer calories. Eating more protein. Eating less unhealthy fats. Eating a diet that includes fresh fruits and vegetables, whole grains, low-fat dairy products, and lean protein. Avoiding foods  with added fat, salt, and sugar. Drink plenty of water while you exercise to prevent dehydration or heat stroke. Activity Choose an activity that you enjoy and set realistic goals. Your health care provider can help you make an exercise plan that works for you. Exercise at a moderate or vigorous intensity most days of the week. The intensity of exercise may vary from person to person. You can tell how intense a workout is for you by paying attention to your breathing and heartbeat. Most people will notice their breathing and heartbeat get faster with more intense exercise. Do resistance training twice each week, such as: Push-ups. Sit-ups. Lifting weights. Using resistance bands. Getting short amounts of exercise can be just as helpful as long, structured periods of exercise. If you have trouble finding time to exercise, try doing these things as part of your daily routine: Get up, stretch, and walk around every 30 minutes throughout the day. Go for a walk during your lunch break. Park your car farther away from your destination. If you take public transportation, get off one stop early and walk the rest of the way. Make phone calls while standing up and walking around. Take the stairs instead of elevators or escalators. Wear comfortable clothes and shoes with good support. Do not exercise so much that you hurt yourself, feel dizzy, or get very short of breath. Where to find more information [place] Department of Health and Human Services: ThisPath.fi Centers for Disease Control and Prevention: [employer]Exhibition.com.br Contact a health care provider: Before starting a new exercise program. If you have questions or concerns about your  weight. If you have a medical problem that keeps you from exercising. Get help right away if: You have any of the following while exercising: Injury. Dizziness. Difficulty breathing or shortness of breath that does not go away when you stop exercising. Chest pain. Rapid  heartbeat. These symptoms may represent a serious problem that is an emergency. Do not wait to see if the symptoms will go away. Get medical help right away. Call your local emergency services (911 in the [place] Do not drive yourself to the hospital. Summary Getting regular exercise is especially important if you are overweight. Being overweight increases your risk of heart disease, stroke, diabetes, high blood pressure, and several types of cancer. Losing weight happens when you burn more calories than you eat. Reducing the amount of calories you eat, and getting regular moderate or vigorous exercise each week, helps you lose weight. This information is not intended to replace advice given to you by your health care provider. Make sure you discuss any questions you have with your health care provider. Document Revised: 10/04/2020 Document Reviewed: 10/04/2020 Elsevier Patient Education  2024 [employer].

## 2023-03-03 LAB — TSH: TSH: 0.495 u[IU]/mL (ref 0.450–4.500)

## 2023-03-03 LAB — HEMOGLOBIN A1C
Est. average glucose Bld gHb Est-mCnc: 131 mg/dL
Hgb A1c MFr Bld: 6.2 % — ABNORMAL HIGH (ref 4.8–5.6)

## 2023-03-06 ENCOUNTER — Ambulatory Visit: Payer: BC Managed Care – PPO | Admitting: Podiatry

## 2023-03-06 DIAGNOSIS — B351 Tinea unguium: Secondary | ICD-10-CM

## 2023-03-06 DIAGNOSIS — M205X1 Other deformities of toe(s) (acquired), right foot: Secondary | ICD-10-CM | POA: Diagnosis not present

## 2023-03-06 MED ORDER — DEXAMETHASONE SODIUM PHOSPHATE 120 MG/30ML IJ SOLN
4.0000 mg | Freq: Once | INTRAMUSCULAR | Status: AC
Start: 2023-03-06 — End: 2023-03-06
  Administered 2023-03-06: 4 mg via INTRA_ARTICULAR

## 2023-03-06 MED ORDER — TRIAMCINOLONE ACETONIDE 10 MG/ML IJ SUSP
10.0000 mg | Freq: Once | INTRAMUSCULAR | Status: AC
Start: 2023-03-06 — End: 2023-03-06
  Administered 2023-03-06: 10 mg

## 2023-03-06 NOTE — Progress Notes (Signed)
  Subjective:  Patient ID: Wendy Parks, female    DOB: 1981-02-14,   MRN: [id number]  Chief Complaint  Patient presents with   Foot Pain    Pt stated that she is still having some swelling and discomfort     42 y.o. female presents for follow-up of right hallux limitus and onychomysosis. Discuss culture results. Relates injection did help but is going on a trip and pain has returned hoping for anoterh injection.  . Denies any other pedal complaints. Denies n/v/f/c.   Past Medical History:  Diagnosis Date   Kidney stone     Objective:  Physical Exam: Vascular: DP/PT pulses 2/4 bilateral. CFT <3 seconds. Normal hair growth on digits. No edema.  Skin. No lacerations or abrasions bilateral feet. Localized edema noted to dorsum of first and second metatarsal area on the right. Right second digit nail thickened and discolored with subungual debris.  Musculoskeletal: MMT 5/5 bilateral lower extremities in DF, PF, Inversion and Eversion. Deceased ROM in DF of ankle joint.  Tender to right great first metatrsal area and first interspace some pain over first MPJ and some pain with ROM of the joint. Most pain in the interspace Neurological: Sensation intact to light touch.   Assessment:   1. Hallux limitus of right foot   2. Onychomycosis       Plan:  Patient was evaluated and treated and all questions answered. -Xrays reviewed. Hardware intact and toe well aligned. Some decreased in joint space noted at first MPJ.  -Discussed hallux limitus and post-op bunion pain and swelling.  and  treatement options; conservative and  Surgical management; risks, benefits, alternatives discussed. All patient's questions answered. -Patient opted for another injection of first MTPJ. After verbal consent, injected into right/left 1st MTPJ for symptomatic relief 1cc marcaine plain, 1cc dexamethasone without complication.   -Recommend continue with good supportive shoes and inserts. Discussed stiff soled shoes  and use of carbon fiber foot plate.   -Examined patient -Discussed treatment options for painful dystrophic nails  -Culture negative for fungus and just trauma.  -Discussed treatments for dystrophic nails and urea nail gel as well.   -Patient to return as needed.    Louann Sjogren, DPM

## 2023-03-07 DIAGNOSIS — F4312 Post-traumatic stress disorder, chronic: Secondary | ICD-10-CM | POA: Diagnosis not present

## 2023-03-14 DIAGNOSIS — F4312 Post-traumatic stress disorder, chronic: Secondary | ICD-10-CM | POA: Diagnosis not present

## 2023-03-16 DIAGNOSIS — N201 Calculus of ureter: Secondary | ICD-10-CM | POA: Diagnosis not present

## 2023-03-16 DIAGNOSIS — N202 Calculus of kidney with calculus of ureter: Secondary | ICD-10-CM | POA: Diagnosis not present

## 2023-03-17 ENCOUNTER — Ambulatory Visit: Payer: BC Managed Care – PPO

## 2023-03-17 VITALS — BP 130/102 | HR 76 | Ht 65.0 in | Wt 216.0 lb

## 2023-03-17 DIAGNOSIS — I10 Essential (primary) hypertension: Secondary | ICD-10-CM

## 2023-03-17 MED ORDER — AMLODIPINE BESYLATE 2.5 MG PO TABS
2.5000 mg | ORAL_TABLET | Freq: Every day | ORAL | 0 refills | Status: DC
Start: 2023-03-17 — End: 2023-04-11

## 2023-03-17 NOTE — Progress Notes (Signed)
Patient presents today for a bp check. Patient reports she is not currently taking any bp meds at this time. Today her bp is 130/100 p75. I had patient wait 10 mins and rechecked and her bp was 130/102 p76. Pt was advised to start amlodipine 2.5 at bedtime and follow up in 2 weeks for a nurse visit bp check.    BP Readings from Last 3 Encounters:  03/02/23 (!) 150/100  02/18/23 119/68  08/08/22 124/80

## 2023-03-18 NOTE — Assessment & Plan Note (Signed)
She is no longer on phentermine. Given her elevated blood pressure, this is no longer a weight loss option for her.  She is encouraged to strive for BMI less than 30 to decrease cardiac risk. Advised to aim for at least 150 minutes of exercise per week.

## 2023-03-18 NOTE — Assessment & Plan Note (Signed)
She is encouraged to cut back on her sodium intake. This is likely partially related to recent stressors. She agrees to rto in 2 weeks for NV. If needed, will start anti-hypertensives at that time.

## 2023-03-18 NOTE — Assessment & Plan Note (Signed)
I will start her on citalopram 10mg  daily. Advised to take in the evenings. Possible side effects d/w patient. She agrees to rto in 4-6 weeks for re-evaluation.  She would also likely benefit from talk therapy as well.

## 2023-03-18 NOTE — Assessment & Plan Note (Signed)
Previous labs reviewed, her A1c has been elevated in the past. I will check an A1c today. Reminded to avoid refined sugars including sugary drinks/foods and processed meats including bacon, sausages and deli meats.  

## 2023-03-21 DIAGNOSIS — F4312 Post-traumatic stress disorder, chronic: Secondary | ICD-10-CM | POA: Diagnosis not present

## 2023-03-25 ENCOUNTER — Other Ambulatory Visit: Payer: Self-pay | Admitting: Internal Medicine

## 2023-03-28 DIAGNOSIS — F4312 Post-traumatic stress disorder, chronic: Secondary | ICD-10-CM | POA: Diagnosis not present

## 2023-03-29 DIAGNOSIS — G43719 Chronic migraine without aura, intractable, without status migrainosus: Secondary | ICD-10-CM | POA: Diagnosis not present

## 2023-04-04 ENCOUNTER — Ambulatory Visit: Payer: BC Managed Care – PPO

## 2023-04-04 VITALS — BP 124/82 | HR 76 | Temp 98.1°F | Ht 65.0 in | Wt 216.0 lb

## 2023-04-04 DIAGNOSIS — F4312 Post-traumatic stress disorder, chronic: Secondary | ICD-10-CM | POA: Diagnosis not present

## 2023-04-04 DIAGNOSIS — I10 Essential (primary) hypertension: Secondary | ICD-10-CM

## 2023-04-04 NOTE — Progress Notes (Signed)
Patient presents today for bpc. She reports taking Amlodipine 2.5 at night. Denies chest pain & SOB. Mild migraine. She admits drinking 64oz of water or more. She also reports not exercising regularly.  BP Readings from Last 3 Encounters:  04/04/23 124/82  03/17/23 (!) 130/102  03/02/23 (!) 150/100   Per provider patient is to follow up on 04/19/23. Patient aware to continue with current regimen.

## 2023-04-04 NOTE — Patient Instructions (Signed)
Hypertension, Adult Hypertension is another name for high blood pressure. High blood pressure forces your heart to work harder to pump blood. This can cause problems over time. There are two numbers in a blood pressure reading. There is a top number (systolic) over a bottom number (diastolic). It is best to have a blood pressure that is below 120/80. What are the causes? The cause of this condition is not known. Some other conditions can lead to high blood pressure. What increases the risk? Some lifestyle factors can make you more likely to develop high blood pressure: Smoking. Not getting enough exercise or physical activity. Being overweight. Having too much fat, sugar, calories, or salt (sodium) in your diet. Drinking too much alcohol. Other risk factors include: Having any of these conditions: Heart disease. Diabetes. High cholesterol. Kidney disease. Obstructive sleep apnea. Having a family history of high blood pressure and high cholesterol. Age. The risk increases with age. Stress. What are the signs or symptoms? High blood pressure may not cause symptoms. Very high blood pressure (hypertensive crisis) may cause: Headache. Fast or uneven heartbeats (palpitations). Shortness of breath. Nosebleed. Vomiting or feeling like you may vomit (nauseous). Changes in how you see. Very bad chest pain. Feeling dizzy. Seizures. How is this treated? This condition is treated by making healthy lifestyle changes, such as: Eating healthy foods. Exercising more. Drinking less alcohol. Your doctor may prescribe medicine if lifestyle changes do not help enough and if: Your top number is above 130. Your bottom number is above 80. Your personal target blood pressure may vary. Follow these instructions at home: Eating and drinking  If told, follow the DASH eating plan. To follow this plan: Fill one half of your plate at each meal with fruits and vegetables. Fill one fourth of your plate  at each meal with whole grains. Whole grains include whole-wheat pasta, brown rice, and whole-grain bread. Eat or drink low-fat dairy products, such as skim milk or low-fat yogurt. Fill one fourth of your plate at each meal with low-fat (lean) proteins. Low-fat proteins include fish, chicken without skin, eggs, beans, and tofu. Avoid fatty meat, cured and processed meat, or chicken with skin. Avoid pre-made or processed food. Limit the amount of salt in your diet to less than 1,500 mg each day. Do not drink alcohol if: Your doctor tells you not to drink. You are pregnant, may be pregnant, or are planning to become pregnant. If you drink alcohol: Limit how much you have to: 0-1 drink a day for women. 0-2 drinks a day for men. Know how much alcohol is in your drink. In the [place] one drink equals one 12 oz bottle of beer (355 mL), one 5 oz glass of wine (148 mL), or one 1 oz glass of hard liquor (44 mL). Lifestyle  Work with your doctor to stay at a healthy weight or to lose weight. Ask your doctor what the best weight is for you. Get at least 30 minutes of exercise that causes your heart to beat faster (aerobic exercise) most days of the week. This may include walking, swimming, or biking. Get at least 30 minutes of exercise that strengthens your muscles (resistance exercise) at least 3 days a week. This may include lifting weights or doing Pilates. Do not smoke or use any products that contain nicotine or tobacco. If you need help quitting, ask your doctor. Check your blood pressure at home as told by your doctor. Keep all follow-up visits. Medicines Take over-the-counter and prescription medicines   only as told by your doctor. Follow directions carefully. Do not skip doses of blood pressure medicine. The medicine does not work as well if you skip doses. Skipping doses also puts you at risk for problems. Ask your doctor about side effects or reactions to medicines that you should watch  for. Contact a doctor if: You think you are having a reaction to the medicine you are taking. You have headaches that keep coming back. You feel dizzy. You have swelling in your ankles. You have trouble with your vision. Get help right away if: You get a very bad headache. You start to feel mixed up (confused). You feel weak or numb. You feel faint. You have very bad pain in your: Chest. Belly (abdomen). You vomit more than once. You have trouble breathing. These symptoms may be an emergency. Get help right away. Call 911. Do not wait to see if the symptoms will go away. Do not drive yourself to the hospital. Summary Hypertension is another name for high blood pressure. High blood pressure forces your heart to work harder to pump blood. For most people, a normal blood pressure is less than 120/80. Making healthy choices can help lower blood pressure. If your blood pressure does not get lower with healthy choices, you may need to take medicine. This information is not intended to replace advice given to you by your health care provider. Make sure you discuss any questions you have with your health care provider. Document Revised: 05/27/2021 Document Reviewed: 05/27/2021 Elsevier Patient Education  2024 Elsevier Inc.  

## 2023-04-05 DIAGNOSIS — G43901 Migraine, unspecified, not intractable, with status migrainosus: Secondary | ICD-10-CM | POA: Diagnosis not present

## 2023-04-05 DIAGNOSIS — G43719 Chronic migraine without aura, intractable, without status migrainosus: Secondary | ICD-10-CM | POA: Diagnosis not present

## 2023-04-05 DIAGNOSIS — M791 Myalgia, unspecified site: Secondary | ICD-10-CM | POA: Diagnosis not present

## 2023-04-05 DIAGNOSIS — M542 Cervicalgia: Secondary | ICD-10-CM | POA: Diagnosis not present

## 2023-04-05 DIAGNOSIS — G518 Other disorders of facial nerve: Secondary | ICD-10-CM | POA: Diagnosis not present

## 2023-04-06 ENCOUNTER — Encounter: Payer: Self-pay | Admitting: Internal Medicine

## 2023-04-10 ENCOUNTER — Other Ambulatory Visit: Payer: Self-pay | Admitting: Internal Medicine

## 2023-04-10 DIAGNOSIS — I10 Essential (primary) hypertension: Secondary | ICD-10-CM

## 2023-04-11 DIAGNOSIS — F4312 Post-traumatic stress disorder, chronic: Secondary | ICD-10-CM | POA: Diagnosis not present

## 2023-04-17 ENCOUNTER — Other Ambulatory Visit: Payer: Self-pay | Admitting: Internal Medicine

## 2023-04-17 DIAGNOSIS — N200 Calculus of kidney: Secondary | ICD-10-CM

## 2023-04-18 DIAGNOSIS — F4312 Post-traumatic stress disorder, chronic: Secondary | ICD-10-CM | POA: Diagnosis not present

## 2023-04-19 ENCOUNTER — Ambulatory Visit (INDEPENDENT_AMBULATORY_CARE_PROVIDER_SITE_OTHER): Payer: BC Managed Care – PPO | Admitting: Internal Medicine

## 2023-04-19 ENCOUNTER — Encounter: Payer: Self-pay | Admitting: Internal Medicine

## 2023-04-19 VITALS — BP 130/84 | HR 70 | Temp 99.2°F | Wt 212.0 lb

## 2023-04-19 DIAGNOSIS — R030 Elevated blood-pressure reading, without diagnosis of hypertension: Secondary | ICD-10-CM

## 2023-04-19 DIAGNOSIS — F4322 Adjustment disorder with anxiety: Secondary | ICD-10-CM | POA: Diagnosis not present

## 2023-04-19 DIAGNOSIS — R946 Abnormal results of thyroid function studies: Secondary | ICD-10-CM | POA: Diagnosis not present

## 2023-04-19 DIAGNOSIS — R1011 Right upper quadrant pain: Secondary | ICD-10-CM

## 2023-04-19 DIAGNOSIS — I10 Essential (primary) hypertension: Secondary | ICD-10-CM

## 2023-04-19 DIAGNOSIS — Z6835 Body mass index (BMI) 35.0-35.9, adult: Secondary | ICD-10-CM

## 2023-04-19 DIAGNOSIS — R7309 Other abnormal glucose: Secondary | ICD-10-CM

## 2023-04-19 DIAGNOSIS — E6609 Other obesity due to excess calories: Secondary | ICD-10-CM

## 2023-04-19 DIAGNOSIS — G8929 Other chronic pain: Secondary | ICD-10-CM

## 2023-04-19 LAB — POCT URINALYSIS DIPSTICK
Bilirubin, UA: NEGATIVE
Blood, UA: NEGATIVE
Glucose, UA: NEGATIVE
Ketones, UA: NEGATIVE
Leukocytes, UA: NEGATIVE
Nitrite, UA: NEGATIVE
Protein, UA: NEGATIVE
Spec Grav, UA: 1.02 (ref 1.010–1.025)
Urobilinogen, UA: 1 E.U./dL
pH, UA: 6.5 (ref 5.0–8.0)

## 2023-04-19 NOTE — Patient Instructions (Signed)
Hypertension, Adult Hypertension is another name for high blood pressure. High blood pressure forces your heart to work harder to pump blood. This can cause problems over time. There are two numbers in a blood pressure reading. There is a top number (systolic) over a bottom number (diastolic). It is best to have a blood pressure that is below 120/80. What are the causes? The cause of this condition is not known. Some other conditions can lead to high blood pressure. What increases the risk? Some lifestyle factors can make you more likely to develop high blood pressure: Smoking. Not getting enough exercise or physical activity. Being overweight. Having too much fat, sugar, calories, or salt (sodium) in your diet. Drinking too much alcohol. Other risk factors include: Having any of these conditions: Heart disease. Diabetes. High cholesterol. Kidney disease. Obstructive sleep apnea. Having a family history of high blood pressure and high cholesterol. Age. The risk increases with age. Stress. What are the signs or symptoms? High blood pressure may not cause symptoms. Very high blood pressure (hypertensive crisis) may cause: Headache. Fast or uneven heartbeats (palpitations). Shortness of breath. Nosebleed. Vomiting or feeling like you may vomit (nauseous). Changes in how you see. Very bad chest pain. Feeling dizzy. Seizures. How is this treated? This condition is treated by making healthy lifestyle changes, such as: Eating healthy foods. Exercising more. Drinking less alcohol. Your doctor may prescribe medicine if lifestyle changes do not help enough and if: Your top number is above 130. Your bottom number is above 80. Your personal target blood pressure may vary. Follow these instructions at home: Eating and drinking  If told, follow the DASH eating plan. To follow this plan: Fill one half of your plate at each meal with fruits and vegetables. Fill one fourth of your plate  at each meal with whole grains. Whole grains include whole-wheat pasta, brown rice, and whole-grain bread. Eat or drink low-fat dairy products, such as skim milk or low-fat yogurt. Fill one fourth of your plate at each meal with low-fat (lean) proteins. Low-fat proteins include fish, chicken without skin, eggs, beans, and tofu. Avoid fatty meat, cured and processed meat, or chicken with skin. Avoid pre-made or processed food. Limit the amount of salt in your diet to less than 1,500 mg each day. Do not drink alcohol if: Your doctor tells you not to drink. You are pregnant, may be pregnant, or are planning to become pregnant. If you drink alcohol: Limit how much you have to: 0-1 drink a day for women. 0-2 drinks a day for men. Know how much alcohol is in your drink. In the [place] one drink equals one 12 oz bottle of beer (355 mL), one 5 oz glass of wine (148 mL), or one 1 oz glass of hard liquor (44 mL). Lifestyle  Work with your doctor to stay at a healthy weight or to lose weight. Ask your doctor what the best weight is for you. Get at least 30 minutes of exercise that causes your heart to beat faster (aerobic exercise) most days of the week. This may include walking, swimming, or biking. Get at least 30 minutes of exercise that strengthens your muscles (resistance exercise) at least 3 days a week. This may include lifting weights or doing Pilates. Do not smoke or use any products that contain nicotine or tobacco. If you need help quitting, ask your doctor. Check your blood pressure at home as told by your doctor. Keep all follow-up visits. Medicines Take over-the-counter and prescription medicines   only as told by your doctor. Follow directions carefully. Do not skip doses of blood pressure medicine. The medicine does not work as well if you skip doses. Skipping doses also puts you at risk for problems. Ask your doctor about side effects or reactions to medicines that you should watch  for. Contact a doctor if: You think you are having a reaction to the medicine you are taking. You have headaches that keep coming back. You feel dizzy. You have swelling in your ankles. You have trouble with your vision. Get help right away if: You get a very bad headache. You start to feel mixed up (confused). You feel weak or numb. You feel faint. You have very bad pain in your: Chest. Belly (abdomen). You vomit more than once. You have trouble breathing. These symptoms may be an emergency. Get help right away. Call 911. Do not wait to see if the symptoms will go away. Do not drive yourself to the hospital. Summary Hypertension is another name for high blood pressure. High blood pressure forces your heart to work harder to pump blood. For most people, a normal blood pressure is less than 120/80. Making healthy choices can help lower blood pressure. If your blood pressure does not get lower with healthy choices, you may need to take medicine. This information is not intended to replace advice given to you by your health care provider. Make sure you discuss any questions you have with your health care provider. Document Revised: 05/27/2021 Document Reviewed: 05/27/2021 Elsevier Patient Education  2024 Elsevier Inc.  

## 2023-04-19 NOTE — Progress Notes (Signed)
I,Wendy Parks, CMA,acting as a Neurosurgeon for Wendy Aliment, MD.,have documented all relevant documentation on the behalf of Wendy Aliment, MD,as directed by  Wendy Aliment, MD while in the presence of Wendy Aliment, MD.  Subjective:  Patient ID: Wendy Parks , female    DOB: April 24, 1981 , 42 y.o.   MRN: [id number]  Chief Complaint  Patient presents with   med check    HPI  Patient presents today for bpc. She reports compliance with medications. Denies headache, chest pain, and SOB.   She is concerned about recent UTI. States she was treated with abx; however, she is still having some flank/abdominal discomfort.  She also has h/o nephrolithiasis. .      Past Medical History:  Diagnosis Date   Kidney stone      Family History  Problem Relation Age of Onset   Healthy Mother    Dementia Father    Cancer Father      Current Outpatient Medications:    AJOVY 225 MG/1.5ML SOAJ, Inject into the skin., Disp: , Rfl:    albuterol (VENTOLIN HFA) 108 (90 Base) MCG/ACT inhaler, Inhale 2 puffs into the lungs every 6 (six) hours as needed for wheezing or shortness of breath., Disp: 18 g, Rfl: 3   amLODipine (NORVASC) 2.5 MG tablet, TAKE 1 TABLET BY MOUTH AT BEDTIME., Disp: 90 tablet, Rfl: 2   baclofen (LIORESAL) 10 MG tablet, Take 10 mg by mouth 2 (two) times daily as needed., Disp: , Rfl:    BOTOX 100 units SOLR injection, , Disp: , Rfl:    citalopram (CELEXA) 10 MG tablet, TAKE 1 TABLET BY MOUTH EVERY DAY, Disp: 90 tablet, Rfl: 1   ondansetron (ZOFRAN-ODT) 4 MG disintegrating tablet, Take 1 tablet (4 mg total) by mouth every 8 (eight) hours as needed., Disp: 20 tablet, Rfl: 0   rizatriptan (MAXALT) 10 MG tablet, 1 TABLET AS NEEDED MIGRAINE MAY REPEAT ONCE AFTER 2 HOURS, Disp: , Rfl:    topiramate (TOPAMAX) 200 MG tablet, Take 200 mg by mouth daily., Disp: , Rfl:    cyclobenzaprine (FLEXERIL) 5 MG tablet, Take 1 tablet (5 mg total) by mouth 3 (three) times daily as needed for  muscle spasms., Disp: 30 tablet, Rfl: 0   No Known Allergies   Review of Systems  Constitutional: Negative.   Respiratory: Negative.    Cardiovascular: Negative.   Gastrointestinal:  Positive for abdominal pain.  Neurological: Negative.   Psychiatric/Behavioral: Negative.       Today's Vitals   04/19/23 1602  BP: 130/84  Pulse: 70  Temp: 99.2 F (37.3 C)  SpO2: 98%  Weight: 212 lb (96.2 kg)   Body mass index is 35.28 kg/m.  Wt Readings from Last 3 Encounters:  04/26/23 216 lb (98 kg)  04/19/23 212 lb (96.2 kg)  04/04/23 216 lb (98 kg)     Objective:  Physical Exam Vitals and nursing note reviewed.  Constitutional:      Appearance: Normal appearance.  HENT:     Head: Normocephalic and atraumatic.  Eyes:     Extraocular Movements: Extraocular movements intact.  Cardiovascular:     Rate and Rhythm: Normal rate and regular rhythm.     Heart sounds: Normal heart sounds.  Pulmonary:     Effort: Pulmonary effort is normal.     Breath sounds: Normal breath sounds.  Abdominal:     General: Bowel sounds are normal.     Tenderness: There is abdominal tenderness.  Musculoskeletal:  Cervical back: Normal range of motion.  Skin:    General: Skin is warm.  Neurological:     General: No focal deficit present.     Mental Status: She is alert.  Psychiatric:        Mood and Affect: Mood normal.        Behavior: Behavior normal.         Assessment And Plan:  Primary hypertension Assessment & Plan: Chronic, fair control.  She will continue with amlodipine 2.5mg  nightly for now. If needed, I will increase dose to 5mg  if needed at future visit. Reminded to follow low sodium diet.    Chronic RUQ pain Assessment & Plan: She agrees to abdominal ultrasound due to persistent sx.   Orders: -     US Abdomen Complete; Future -     POCT urinalysis dipstick  Adjustment disorder with anxious mood Assessment & Plan: Chronic, sx stable on citalopram 10mg  daily. Encouraged  to take daily as prescribed.   Orders: -     T4, free  Other abnormal glucose Assessment & Plan: Previous labs reviewed, her A1c has been elevated in the past. I will check an A1c today. Reminded to avoid refined sugars including sugary drinks/foods and processed meats including bacon, sausages and deli meats.     Class 2 obesity due to excess calories without serious comorbidity with body mass index (BMI) of 35.0 to 35.9 in adult Assessment & Plan: She is encouraged to strive for BMI less than 30 to decrease cardiac risk. Advised to aim for at least 150 minutes of exercise per week.    She is encouraged to strive for BMI less than 30 to decrease cardiac risk. Advised to aim for at least 150 minutes of exercise per week.    Return for 4 month bpc.  Patient was given opportunity to ask questions. Patient verbalized understanding of the plan and was able to repeat key elements of the plan. All questions were answered to their satisfaction.    I, Wendy Aliment, MD, have reviewed all documentation for this visit. The documentation on 04/19/23 for the exam, diagnosis, procedures, and orders are all accurate and complete.   IF YOU HAVE BEEN REFERRED TO A SPECIALIST, IT MAY TAKE 1-2 WEEKS TO SCHEDULE/PROCESS THE REFERRAL. IF YOU HAVE NOT HEARD FROM US/SPECIALIST IN TWO WEEKS, PLEASE GIVE [place] A CALL AT [phone].   THE PATIENT IS ENCOURAGED TO PRACTICE SOCIAL DISTANCING DUE TO THE COVID-19 PANDEMIC.

## 2023-04-20 LAB — T4, FREE: Free T4: 1.06 ng/dL (ref 0.82–1.77)

## 2023-04-25 ENCOUNTER — Ambulatory Visit
Admission: RE | Admit: 2023-04-25 | Discharge: 2023-04-25 | Disposition: A | Discharge status: Home / Self Care | Payer: BC Managed Care – PPO | Source: Ambulatory Visit | Attending: Internal Medicine | Admitting: Internal Medicine

## 2023-04-25 DIAGNOSIS — G8929 Other chronic pain: Secondary | ICD-10-CM

## 2023-04-25 DIAGNOSIS — F4312 Post-traumatic stress disorder, chronic: Secondary | ICD-10-CM | POA: Diagnosis not present

## 2023-04-25 DIAGNOSIS — R1011 Right upper quadrant pain: Secondary | ICD-10-CM | POA: Diagnosis not present

## 2023-04-26 ENCOUNTER — Ambulatory Visit: Payer: BC Managed Care – PPO | Admitting: Urology

## 2023-04-26 ENCOUNTER — Encounter: Payer: Self-pay | Admitting: Urology

## 2023-04-26 VITALS — BP 125/81 | HR 60 | Ht 65.0 in | Wt 216.0 lb

## 2023-04-26 DIAGNOSIS — N201 Calculus of ureter: Secondary | ICD-10-CM

## 2023-04-26 DIAGNOSIS — N200 Calculus of kidney: Secondary | ICD-10-CM

## 2023-04-26 NOTE — Progress Notes (Signed)
I,Dina M Abdulla,acting as a scribe for Riki Altes, MD.,have documented all relevant documentation on the behalf of Riki Altes, MD,as directed by  Riki Altes, MD while in the presence of Riki Altes, MD.  04/26/2023 10:16 PM   Franklyn Lor 1980/12/03 [id number]  Referring provider: Dorothyann Peng, MD [address] [place],  [place] [address]  Chief Complaint  Patient presents with   Nephrolithiasis    HPI: Wendy Parks is a 42 y.o. female presenting for evaluation of nephrolithiasis.  Seen at ED [facility] 04/20/23 with acute onset of right flank pain, which felt similar to prior episodes of renal colic, secondary to urinary calculi. +Nausea and vomiting, No fever or chills. Prior history of shockwave lithotripsy. Pain rated 9/10. ED evaluation remarkable for UA, which showed >50 RBCs; CT renal stone study. Showed a 3 mm right proximal ureteral calculus with mild hydronephrosis and upstream hydroureter; non-obstructing small, left renal calculi were also noted. Since her ED visit, she has continued to have intermittent right flank pain, though not severe. She has had some frequency and urgency. An abdominal ultrasound performed 04/25/23 showed no hydronephrosis. She is no longer taking Tamsulosin.   PMH: Past Medical History:  Diagnosis Date   Kidney stone     Surgical History: Past Surgical History:  Procedure Laterality Date   BUNIONECTOMY  11/16/2020   ENDOMETRIAL ABLATION  01/2019   Dr. Su Hilt   KIDNEY STONE SURGERY     TUBAL LIGATION  2012    Home Medications:  Allergies as of 04/26/2023   No Known Allergies      Medication List        Accurate as of April 26, 2023 10:16 PM. If you have any questions, ask your nurse or doctor.          STOP taking these medications    HYDROcodone-acetaminophen 5-325 MG tablet Commonly known as: NORCO/VICODIN Stopped by: Riki Altes   meloxicam 15 MG tablet Commonly known as:  MOBIC Stopped by: Verna Czech Ayson Cherubini   montelukast 10 MG tablet Commonly known as: SINGULAIR Stopped by: Riki Altes   tamsulosin 0.4 MG Caps capsule Commonly known as: Flomax Stopped by: Riki Altes       TAKE these medications    Ajovy 225 MG/1.5ML Soaj Generic drug: Fremanezumab-vfrm Inject into the skin.   albuterol 108 (90 Base) MCG/ACT inhaler Commonly known as: VENTOLIN HFA Inhale 2 puffs into the lungs every 6 (six) hours as needed for wheezing or shortness of breath.   amLODipine 2.5 MG tablet Commonly known as: NORVASC TAKE 1 TABLET BY MOUTH AT BEDTIME.   baclofen 10 MG tablet Commonly known as: LIORESAL Take 10 mg by mouth 2 (two) times daily as needed.   Botox 100 units Solr injection Generic drug: botulinum toxin Type A   citalopram 10 MG tablet Commonly known as: CELEXA TAKE 1 TABLET BY MOUTH EVERY DAY   cyclobenzaprine 5 MG tablet Commonly known as: FLEXERIL Take 1 tablet (5 mg total) by mouth 3 (three) times daily as needed for muscle spasms.   ondansetron 4 MG disintegrating tablet Commonly known as: ZOFRAN-ODT Take 1 tablet (4 mg total) by mouth every 8 (eight) hours as needed.   rizatriptan 10 MG tablet Commonly known as: MAXALT 1 TABLET AS NEEDED MIGRAINE MAY REPEAT ONCE AFTER 2 HOURS   topiramate 200 MG tablet Commonly known as: TOPAMAX Take 200 mg by mouth daily.  Family History: Family History  Problem Relation Age of Onset   Healthy Mother    Dementia Father    Cancer Father     Social History:  reports that she has never smoked. She has never used smokeless tobacco. She reports current alcohol use. She reports that she does not use drugs.   Physical Exam: BP 125/81   Pulse 60   Ht 5\' 5"  (1.651 m)   Wt 216 lb (98 kg)   BMI 35.94 kg/m   Constitutional:  Alert and oriented, No acute distress. HEENT: [place] AT Respiratory: Normal respiratory effort, no increased work of breathing. Psychiatric: Normal mood and  affect.    Laboratory Data:  Urinalysis  Microscopy >30 WBC/ >30 RBC, however, there >10 epis indicative of vaginal contamination.    Assessment & Plan:    1. Right ureteral calculus CT showed a 3 mm right proximal ureteral calculus. The recent abdominal ultrasound showed resolution of her hydronephrosis. We discussed the possibility of persistent calculus without obstruction. KUB was ordered, She works at [employer] and will have x-ray performed later this week and will call with results. If the stone is not visualized but she is having persistent symptoms, recommend a follow up CT. She will restart Tamsulosin, which was refilled.  Recommended metabolic evaluation and will place an order through [facility].  I have reviewed the above documentation for accuracy and completeness, and I agree with the above.   Riki Altes, MD  [facility] [address] [place], [place] [address] [phone]

## 2023-04-26 NOTE — Patient Instructions (Signed)

## 2023-04-27 LAB — URINALYSIS, COMPLETE
Bilirubin, UA: NEGATIVE
Glucose, UA: NEGATIVE
Ketones, UA: NEGATIVE
Nitrite, UA: NEGATIVE
Specific Gravity, UA: 1.025 (ref 1.005–1.030)
Urobilinogen, Ur: 0.2 mg/dL (ref 0.2–1.0)
pH, UA: 5.5 (ref 5.0–7.5)

## 2023-04-27 LAB — MICROSCOPIC EXAMINATION
Epithelial Cells (non renal): >10 /HPF — AB (ref 0–10)
RBC, Urine: >30 /HPF — AB (ref 0–2)
WBC, UA: >30 /HPF — AB (ref 0–5)

## 2023-04-30 ENCOUNTER — Encounter: Payer: Self-pay | Admitting: Internal Medicine

## 2023-04-30 DIAGNOSIS — E66812 Obesity, class 2: Secondary | ICD-10-CM | POA: Insufficient documentation

## 2023-04-30 DIAGNOSIS — E6609 Other obesity due to excess calories: Secondary | ICD-10-CM | POA: Insufficient documentation

## 2023-04-30 DIAGNOSIS — G8929 Other chronic pain: Secondary | ICD-10-CM | POA: Insufficient documentation

## 2023-04-30 NOTE — Assessment & Plan Note (Signed)
She is encouraged to strive for BMI less than 30 to decrease cardiac risk. Advised to aim for at least 150 minutes of exercise per week.  

## 2023-04-30 NOTE — Assessment & Plan Note (Signed)
Chronic, fair control.  She will continue with amlodipine 2.5mg  nightly for now. If needed, I will increase dose to 5mg  if needed at future visit. Reminded to follow low sodium diet.

## 2023-04-30 NOTE — Assessment & Plan Note (Signed)
Previous labs reviewed, her A1c has been elevated in the past. I will check an A1c today. Reminded to avoid refined sugars including sugary drinks/foods and processed meats including bacon, sausages and deli meats.  

## 2023-04-30 NOTE — Assessment & Plan Note (Signed)
Chronic, sx stable on citalopram 10mg  daily. Encouraged to take daily as prescribed.

## 2023-04-30 NOTE — Assessment & Plan Note (Addendum)
She agrees to abdominal ultrasound due to persistent sx.

## 2023-05-02 DIAGNOSIS — F4312 Post-traumatic stress disorder, chronic: Secondary | ICD-10-CM | POA: Diagnosis not present

## 2023-05-07 ENCOUNTER — Ambulatory Visit (HOSPITAL_COMMUNITY)
Admission: RE | Admit: 2023-05-07 | Discharge: 2023-05-07 | Disposition: A | Discharge status: Home / Self Care | Payer: BC Managed Care – PPO | Source: Ambulatory Visit | Attending: Urology | Admitting: Urology

## 2023-05-07 DIAGNOSIS — N200 Calculus of kidney: Secondary | ICD-10-CM | POA: Diagnosis not present

## 2023-05-07 DIAGNOSIS — R109 Unspecified abdominal pain: Secondary | ICD-10-CM | POA: Diagnosis not present

## 2023-05-09 DIAGNOSIS — F4312 Post-traumatic stress disorder, chronic: Secondary | ICD-10-CM | POA: Diagnosis not present

## 2023-05-12 ENCOUNTER — Other Ambulatory Visit: Payer: BC Managed Care – PPO

## 2023-05-12 DIAGNOSIS — N200 Calculus of kidney: Secondary | ICD-10-CM | POA: Diagnosis not present

## 2023-05-13 LAB — LITHOLINK SERUM PANEL
CO2: 24 mmol/L (ref 20–29)
Calcium: 10.1 mg/dL (ref 8.7–10.2)
Chloride: 105 mmol/L (ref 96–106)
Creatinine, Ser: 0.85 mg/dL (ref 0.57–1.00)
Magnesium: 1.8 mg/dL (ref 1.6–2.3)
Phosphorus: 3 mg/dL (ref 3.0–4.3)
Potassium: 4.1 mmol/L (ref 3.5–5.2)
Sodium: 141 mmol/L (ref 134–144)
Uric Acid: 5 mg/dL (ref 2.6–6.2)
eGFR: 88 mL/min/{1.73_m2} (ref >59)

## 2023-05-14 IMAGING — MR MR HEAD W/O CM
10 series · 48 of 48 positions shown · non-contrast
Comparison: No pertinent prior exams available for comparison.

CLINICAL DATA: Provided history: Intractable chronic migraine
without [employer] and without status migrainosus. Additional history
provided by scanning technologist: Patient reports migraines,
nausea, difficulty walking, blurred vision, confusion.

EXAM:
MRI HEAD WITHOUT CONTRAST
TECHNIQUE: Multiplanar, multiecho pulse sequences of the brain and surrounding
structures were obtained without intravenous contrast.

[Series 2: T1 · sagittal · 5.0mm · 0.47mm/px · 3 of 23 slices shown]
[im 1/23]
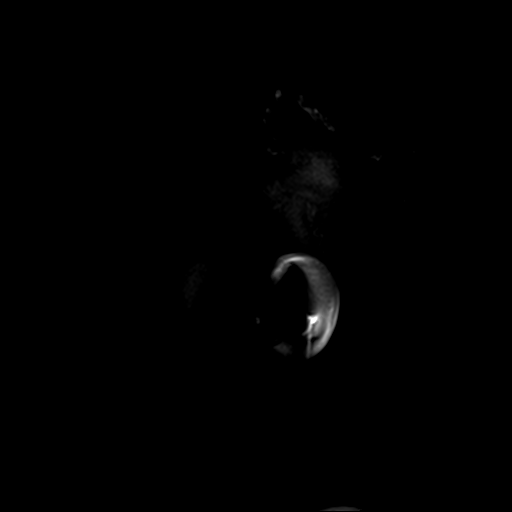
[im 12/23]
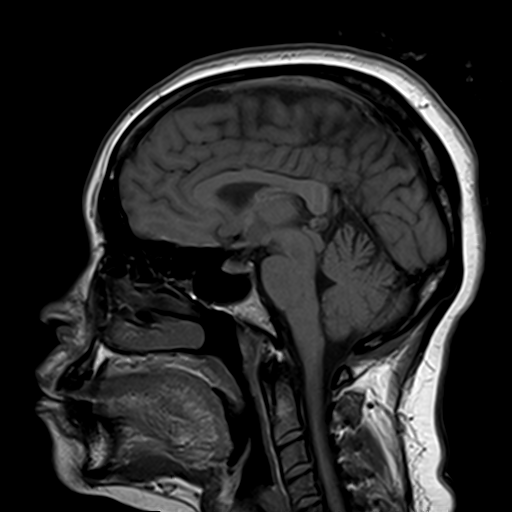
[im 23/23]
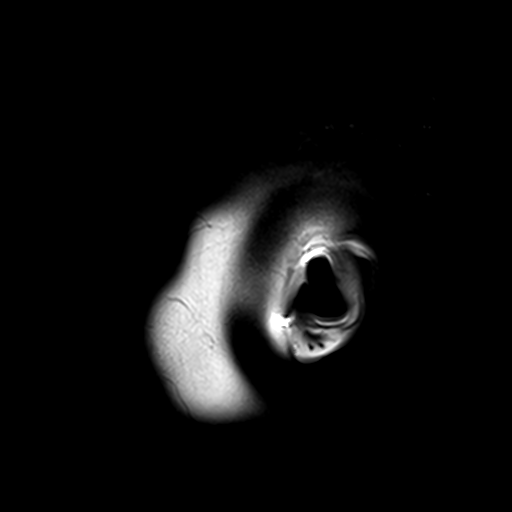

[Series 3: DWI · axial · 3.0mm · 1.88mm/px · z∈[-63,+83]mm · 9 of 100 slices shown (1 of 4)]
[im 1/100]
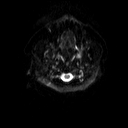
[im 13/100]
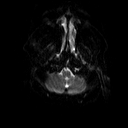
[im 25/100]
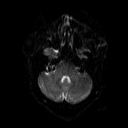
[im 38/100]
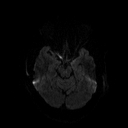
[im 50/100]
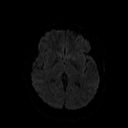
[im 62/100]
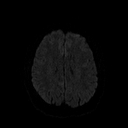
[im 75/100]
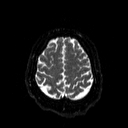
[im 87/100]
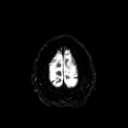
[im 100/100]
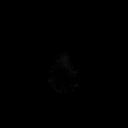

[Series 4: DWI · axial · 3.0mm · 1.88mm/px · z∈[-63,+83]mm · 4 of 49 slices shown (2 of 4)]
[im 1/49]
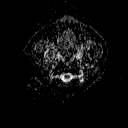
[im 17/49]
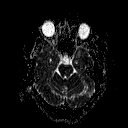
[im 33/49]
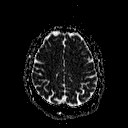
[im 49/49]
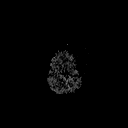

[Series 5: DWI · coronal · 5.0mm · 1.80mm/px · 6 of 67 slices shown (3 of 4)]
[im 1/67]
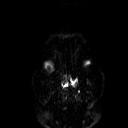
[im 14/67]
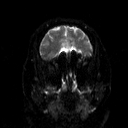
[im 27/67]
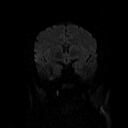
[im 40/67]
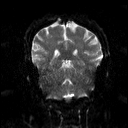
[im 53/67]
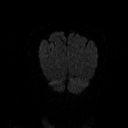
[im 67/67]
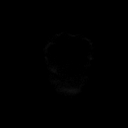

[Series 6: DWI · coronal · 5.0mm · 1.80mm/px · 3 of 34 slices shown (4 of 4)]
[im 1/34]
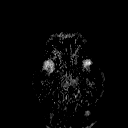
[im 17/34]
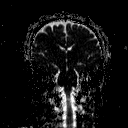
[im 34/34]
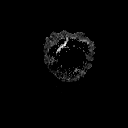

[Series 7: T2 · axial · 5.0mm · 0.60mm/px · z∈[-71,+82]mm · 2 of 23 slices shown (1 of 2)]
[im 1/23]
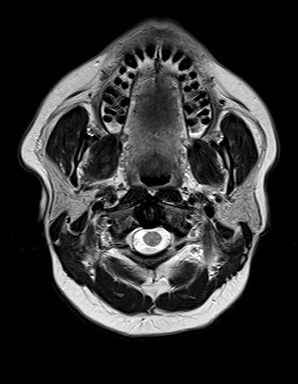
[im 23/23]
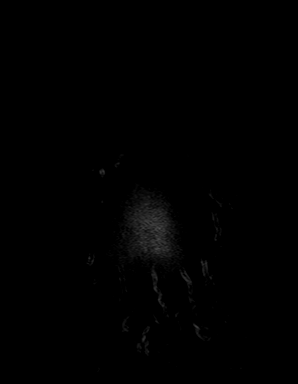

[Series 8: FLAIR · axial · 3.0mm · 0.45mm/px · z∈[-71,+77]mm · 3 of 33 slices shown]
[im 1/33]
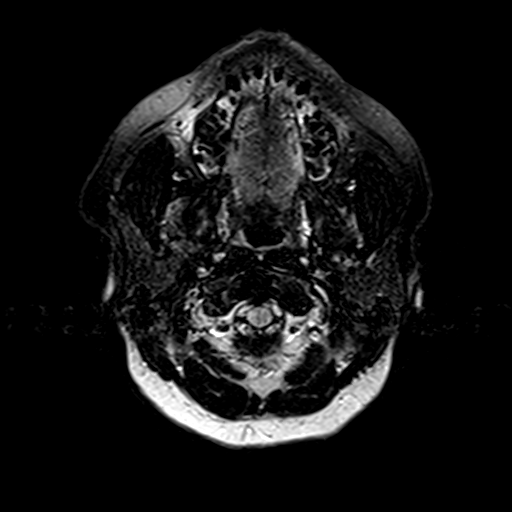
[im 17/33]
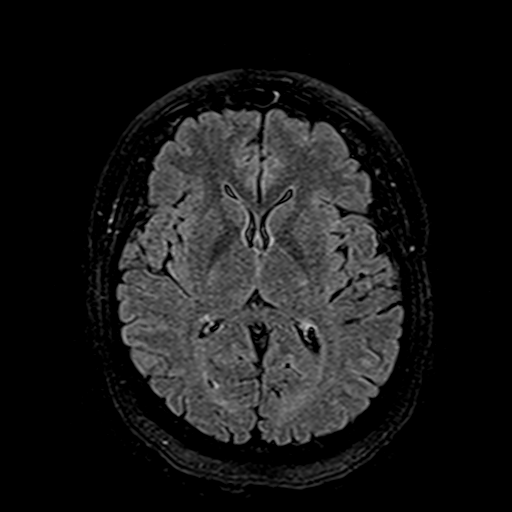
[im 33/33]
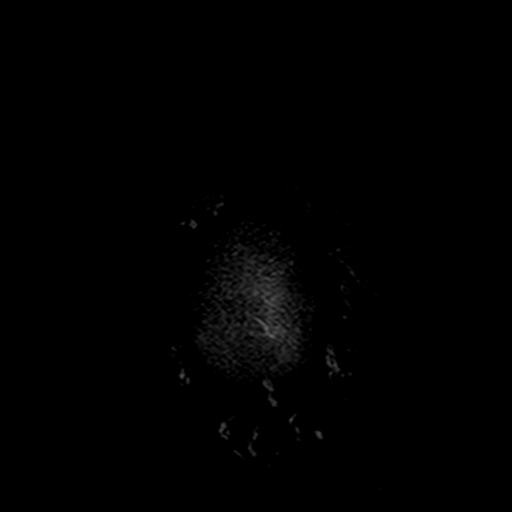

[Series 10: swi_images · axial · 4.0mm · 0.90mm/px · z∈[-66,+74]mm · 3 of 36 slices shown]
[im 1/36]
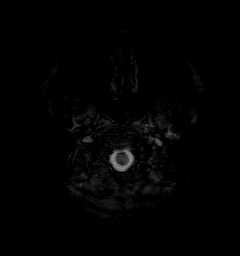
[im 18/36]
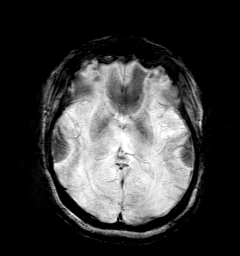
[im 36/36]
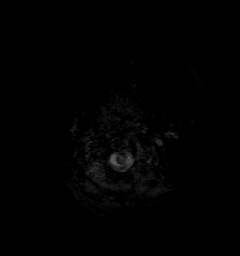

[Series 11: t1_mpr_tra · axial · 1.0mm · 0.72mm/px · z∈[-65,+77]mm · 13 of 144 slices shown]
[im 1/144]
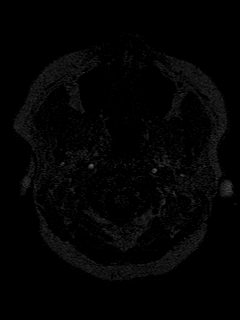
[im 12/144]
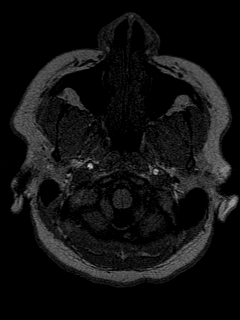
[im 24/144]
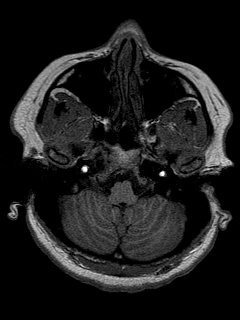
[im 36/144]
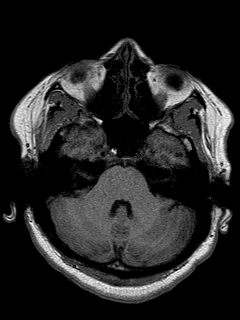
[im 48/144]
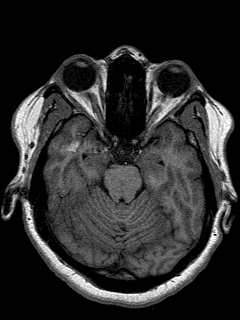
[im 60/144]
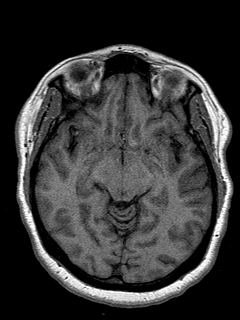
[im 72/144]
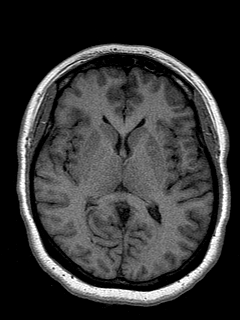
[im 84/144]
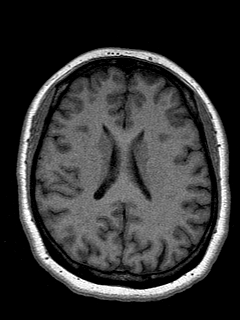
[im 96/144]
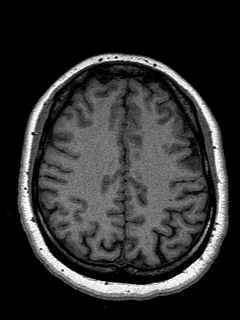
[im 108/144]
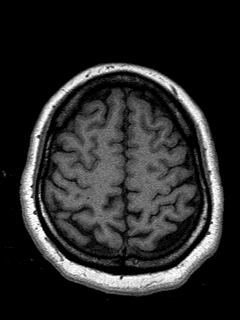
[im 120/144]
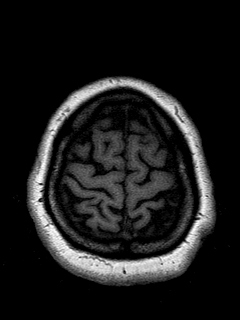
[im 132/144]
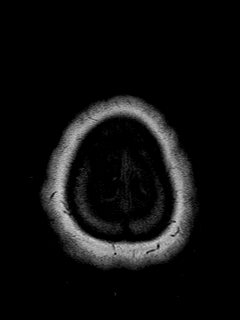
[im 144/144]
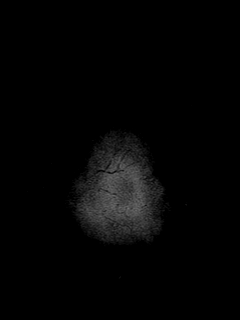

[Series 12: T2 · coronal · 5.0mm · 0.45mm/px · 2 of 25 slices shown (2 of 2)]
[im 1/25]
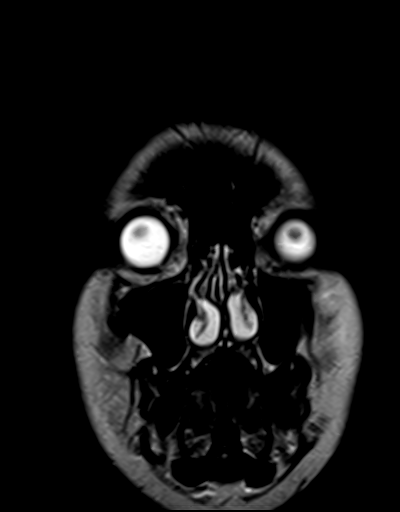
[im 25/25]
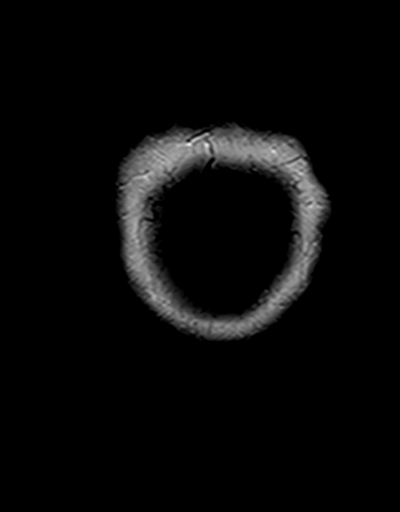

[48 of 48 positions shown; findings below may reference images not displayed]

FINDINGS: Brain:

Cerebral volume is normal.

Partially empty sella turcica.

5 mm pineal cyst.

No cortical encephalomalacia is identified. No significant cerebral
white matter disease.

There is no acute infarct.

No evidence of an intracranial mass.

No chronic intracranial blood products.

No extra-axial fluid collection.

No midline shift.

Vascular: Maintained flow voids within the proximal large arterial
vessels.

Skull and upper cervical spine: No focal suspicious marrow lesion.

Sinuses/Orbits: Visualized orbits show no acute finding. No
significant paranasal sinus disease.
IMPRESSION: Partially empty sella turcica. While this finding often reflects
incidental anatomic variation, it can also be associated with
idiopathic intracranial hypertension (pseudotumor cerebri).

5 mm pineal cyst.

Otherwise unremarkable non-contrast MRI appearance of the brain.

## 2023-05-16 DIAGNOSIS — F4312 Post-traumatic stress disorder, chronic: Secondary | ICD-10-CM | POA: Diagnosis not present

## 2023-05-17 DIAGNOSIS — G518 Other disorders of facial nerve: Secondary | ICD-10-CM | POA: Diagnosis not present

## 2023-05-17 DIAGNOSIS — M791 Myalgia, unspecified site: Secondary | ICD-10-CM | POA: Diagnosis not present

## 2023-05-17 DIAGNOSIS — M542 Cervicalgia: Secondary | ICD-10-CM | POA: Diagnosis not present

## 2023-05-17 DIAGNOSIS — G43901 Migraine, unspecified, not intractable, with status migrainosus: Secondary | ICD-10-CM | POA: Diagnosis not present

## 2023-05-17 LAB — LITHOLINK 24HR URINE PANEL
Ammonium, Urine: 34 mmol/24 hr (ref 15–60)
Calcium Oxalate Saturation: 8.64 (ref 6.00–10.00)
Calcium Phosphate Saturation: 4.45 — ABNORMAL HIGH (ref 0.50–2.00)
Calcium, Urine: 453 mg/24 hr — ABNORMAL HIGH (ref <200)
Calcium/Creatinine Ratio: 296 mg/g creat — ABNORMAL HIGH (ref 51–262)
Calcium/Kg Body Weight: 4.8 mg/24 hr/kg — ABNORMAL HIGH (ref <4.0)
Chloride, Urine: 111 mmol/24 hr (ref 70–250)
Citrate, Urine: 237 mg/24 hr — ABNORMAL LOW (ref >550)
Creatinine, Urine: 1528 mg/24 hr
Creatinine/Kg Body Weight: 16.1 mg/24 hr/kg (ref 8.7–20.3)
Cystine, Urine, Qualitative: NEGATIVE
Magnesium, Urine: 119 mg/24 hr (ref 30–120)
Oxalate, Urine: 26 mg/24 hr (ref 20–40)
Phosphorus, Urine: 1214 mg/24 hr — ABNORMAL HIGH (ref 600–1200)
Potassium, Urine: 43 mmol/24 hr (ref 20–100)
Protein Catabolic Rate: 0.8 g/kg/24 hr (ref 0.8–1.4)
Sodium, Urine: 119 mmol/24 hr (ref 50–150)
Sulfate, Urine: 36 meq/24 hr (ref 20–80)
Urea Nitrogen, Urine: 9.63 g/24 hr (ref 6.00–14.00)
Uric Acid Saturation: 0.42 (ref <1.00)
Uric Acid, Urine: 619 mg/24 hr (ref <750)
Urine Volume (Preserved): 1710 mL/24 hr (ref 500–4000)
pH, 24 hr, Urine: 6.275 — ABNORMAL HIGH (ref 5.800–6.200)

## 2023-05-23 DIAGNOSIS — F4312 Post-traumatic stress disorder, chronic: Secondary | ICD-10-CM | POA: Diagnosis not present

## 2023-06-01 ENCOUNTER — Encounter: Payer: Self-pay | Admitting: Urology

## 2023-06-02 ENCOUNTER — Other Ambulatory Visit: Payer: Self-pay | Admitting: Urology

## 2023-06-02 DIAGNOSIS — N200 Calculus of kidney: Secondary | ICD-10-CM

## 2023-06-06 DIAGNOSIS — F4312 Post-traumatic stress disorder, chronic: Secondary | ICD-10-CM | POA: Diagnosis not present

## 2023-06-14 ENCOUNTER — Encounter: Payer: Self-pay | Admitting: Internal Medicine

## 2023-06-20 DIAGNOSIS — F4312 Post-traumatic stress disorder, chronic: Secondary | ICD-10-CM | POA: Diagnosis not present

## 2023-06-26 DIAGNOSIS — G43719 Chronic migraine without aura, intractable, without status migrainosus: Secondary | ICD-10-CM | POA: Diagnosis not present

## 2023-06-30 ENCOUNTER — Other Ambulatory Visit: Payer: Self-pay | Admitting: Medical Genetics

## 2023-06-30 DIAGNOSIS — Z006 Encounter for examination for normal comparison and control in clinical research program: Secondary | ICD-10-CM

## 2023-07-01 ENCOUNTER — Other Ambulatory Visit: Payer: Self-pay

## 2023-07-01 ENCOUNTER — Emergency Department (HOSPITAL_COMMUNITY)
Admission: EM | Admit: 2023-07-01 | Discharge: 2023-07-02 | Disposition: A | Discharge status: Home / Self Care | Payer: BC Managed Care – PPO | Attending: Emergency Medicine | Admitting: Emergency Medicine

## 2023-07-01 ENCOUNTER — Encounter (HOSPITAL_COMMUNITY): Payer: Self-pay | Admitting: Emergency Medicine

## 2023-07-01 ENCOUNTER — Emergency Department (HOSPITAL_COMMUNITY): Payer: BC Managed Care – PPO

## 2023-07-01 DIAGNOSIS — R1084 Generalized abdominal pain: Secondary | ICD-10-CM | POA: Insufficient documentation

## 2023-07-01 DIAGNOSIS — N200 Calculus of kidney: Secondary | ICD-10-CM

## 2023-07-01 DIAGNOSIS — K429 Umbilical hernia without obstruction or gangrene: Secondary | ICD-10-CM | POA: Diagnosis not present

## 2023-07-01 DIAGNOSIS — I10 Essential (primary) hypertension: Secondary | ICD-10-CM | POA: Diagnosis not present

## 2023-07-01 DIAGNOSIS — D72829 Elevated white blood cell count, unspecified: Secondary | ICD-10-CM | POA: Insufficient documentation

## 2023-07-01 DIAGNOSIS — Z79899 Other long term (current) drug therapy: Secondary | ICD-10-CM | POA: Insufficient documentation

## 2023-07-01 DIAGNOSIS — K5730 Diverticulosis of large intestine without perforation or abscess without bleeding: Secondary | ICD-10-CM | POA: Diagnosis not present

## 2023-07-01 DIAGNOSIS — N134 Hydroureter: Secondary | ICD-10-CM | POA: Diagnosis not present

## 2023-07-01 DIAGNOSIS — N132 Hydronephrosis with renal and ureteral calculous obstruction: Secondary | ICD-10-CM | POA: Diagnosis not present

## 2023-07-01 LAB — COMPREHENSIVE METABOLIC PANEL
ALT: 16 U/L (ref 0–44)
AST: 19 U/L (ref 15–41)
Albumin: 4 g/dL (ref 3.5–5.0)
Alkaline Phosphatase: 74 U/L (ref 38–126)
Anion gap: 7 (ref 5–15)
BUN: 16 mg/dL (ref 6–20)
CO2: 19 mmol/L — ABNORMAL LOW (ref 22–32)
Calcium: 8.4 mg/dL — ABNORMAL LOW (ref 8.9–10.3)
Chloride: 109 mmol/L (ref 98–111)
Creatinine, Ser: 1.05 mg/dL — ABNORMAL HIGH (ref 0.44–1.00)
GFR, Estimated: >60 mL/min (ref >60)
Glucose, Bld: 143 mg/dL — ABNORMAL HIGH (ref 70–99)
Potassium: 3.3 mmol/L — ABNORMAL LOW (ref 3.5–5.1)
Sodium: 135 mmol/L (ref 135–145)
Total Bilirubin: 0.5 mg/dL (ref <1.2)
Total Protein: 7.5 g/dL (ref 6.5–8.1)

## 2023-07-01 LAB — CBC
HCT: 37.4 % (ref 36.0–46.0)
Hemoglobin: 12.3 g/dL (ref 12.0–15.0)
MCH: 30.2 pg (ref 26.0–34.0)
MCHC: 32.9 g/dL (ref 30.0–36.0)
MCV: 91.9 fL (ref 80.0–100.0)
Platelets: 307 10*3/uL (ref 150–400)
RBC: 4.07 MIL/uL (ref 3.87–5.11)
RDW: 12 % (ref 11.5–15.5)
WBC: 16.6 10*3/uL — ABNORMAL HIGH (ref 4.0–10.5)
nRBC: 0 % (ref 0.0–0.2)

## 2023-07-01 LAB — LIPASE, BLOOD: Lipase: 25 U/L (ref 11–51)

## 2023-07-01 LAB — HCG, SERUM, QUALITATIVE: Preg, Serum: NEGATIVE

## 2023-07-01 MED ORDER — ONDANSETRON 4 MG PO TBDP
4.0000 mg | ORAL_TABLET | Freq: Once | ORAL | Status: AC | PRN
  Administered 2023-07-01: 4 mg via ORAL
  Filled 2023-07-01: qty 1

## 2023-07-01 MED ORDER — MORPHINE SULFATE (PF) 4 MG/ML IV SOLN
4.0000 mg | Freq: Once | INTRAVENOUS | Status: AC
  Administered 2023-07-01: 4 mg via INTRAVENOUS
  Filled 2023-07-01: qty 1

## 2023-07-01 MED ORDER — HYDROMORPHONE HCL 1 MG/ML IJ SOLN
1.0000 mg | Freq: Once | INTRAMUSCULAR | Status: AC
  Administered 2023-07-01: 1 mg via INTRAVENOUS
  Filled 2023-07-01: qty 1

## 2023-07-01 MED ORDER — HYDROMORPHONE HCL 1 MG/ML IJ SOLN
1.0000 mg | Freq: Once | INTRAMUSCULAR | Status: AC
Start: 2023-07-01 — End: 2023-07-01
  Administered 2023-07-01: 1 mg via INTRAVENOUS
  Filled 2023-07-01: qty 1

## 2023-07-01 MED ORDER — SODIUM CHLORIDE 0.9 % IV BOLUS
1000.0000 mL | Freq: Once | INTRAVENOUS | Status: AC
  Administered 2023-07-01: 1000 mL via INTRAVENOUS

## 2023-07-01 MED ORDER — ONDANSETRON HCL 4 MG/2ML IJ SOLN
4.0000 mg | Freq: Once | INTRAMUSCULAR | Status: AC
  Administered 2023-07-01: 4 mg via INTRAVENOUS
  Filled 2023-07-01: qty 2

## 2023-07-01 MED ORDER — KETOROLAC TROMETHAMINE 15 MG/ML IJ SOLN
15.0000 mg | Freq: Once | INTRAMUSCULAR | Status: AC
  Administered 2023-07-01: 15 mg via INTRAVENOUS
  Filled 2023-07-01: qty 1

## 2023-07-01 MED ORDER — HYDROCODONE-ACETAMINOPHEN 5-325 MG PO TABS
1.0000 | ORAL_TABLET | Freq: Once | ORAL | Status: AC
  Administered 2023-07-01: 1 via ORAL
  Filled 2023-07-01: qty 1

## 2023-07-01 NOTE — ED Provider Notes (Signed)
Care of patient assumed from Dr. Estelle Grumbles.  This patient with history of kidney stones presenting for left flank pain, nausea, vomiting since this afternoon.  Currently awaiting results of urinalysis and CT scan. Physical Exam  BP 136/79   Pulse 87   Temp 98.2 F (36.8 C) (Oral)   Resp 12   Ht 5\' 5"  (1.651 m)   Wt 97.5 kg   SpO2 96%   BMI 35.78 kg/m   Physical Exam  Procedures  Procedures  ED Course / MDM    Medical Decision Making Amount and/or Complexity of Data Reviewed Labs: ordered.  Risk Prescription drug management.   ***

## 2023-07-01 NOTE — ED Triage Notes (Signed)
Patient arrives ambulatory by POV c/o left flank pain and emesis x 3:30pm today.

## 2023-07-01 NOTE — ED Provider Triage Note (Signed)
Emergency Medicine Provider Triage Evaluation Note  Wendy Parks , a 42 y.o. female  was evaluated in triage.  Pt complains of kidney stone.  Review of Systems  Positive: As above  Negative: As above  Physical Exam  BP (!) 141/88 (BP Location: Left Arm)   Pulse 81   Temp 98.2 F (36.8 C) (Oral)   Resp 18   Ht 5\' 5"  (1.651 m)   Wt 97.5 kg   SpO2 97%   BMI 35.78 kg/m  Gen:   Awake, no distress   Resp:  Normal effort  MSK:   Moves extremities without difficulty  Other:    Medical Decision Making  Medically screening exam initiated at 7:10 PM.  Appropriate orders placed.  Wendy Parks was informed that the remainder of the evaluation will be completed by another provider, this initial triage assessment does not replace that evaluation, and the importance of remaining in the ED until their evaluation is complete.     Marita [place], PA-C 07/01/23 1910

## 2023-07-01 NOTE — ED Provider Notes (Signed)
[place] EMERGENCY DEPARTMENT AT [facility] Provider Note   CSN: [id number] Arrival date & time: 07/01/23  1829     History  Chief Complaint  Patient presents with   Flank Pain   Emesis    Wendy Parks is a 42 y.o. female.  Pt is a 42 yo female with pmhx significant for kidney stones, htn, and migraines.  Pt has had left flank pain with n/v since around 1530.  Pt said this feels like a kidney stone.         Home Medications Prior to Admission medications   Medication Sig Start Date End Date Taking? Authorizing Provider  AJOVY 225 MG/1.5ML SOAJ Inject into the skin.    [provider]  albuterol (VENTOLIN HFA) 108 (90 Base) MCG/ACT inhaler Inhale 2 puffs into the lungs every 6 (six) hours as needed for wheezing or shortness of breath. 09/20/22   Dorothyann Peng, MD  amLODipine (NORVASC) 2.5 MG tablet TAKE 1 TABLET BY MOUTH AT BEDTIME. 04/11/23   Dorothyann Peng, MD  baclofen (LIORESAL) 10 MG tablet Take 10 mg by mouth 2 (two) times daily as needed. 06/14/19   [provider]  BOTOX 100 units SOLR injection  12/23/20   [provider]  citalopram (CELEXA) 10 MG tablet TAKE 1 TABLET BY MOUTH EVERY DAY 03/27/23   Dorothyann Peng, MD  cyclobenzaprine (FLEXERIL) 5 MG tablet Take 1 tablet (5 mg total) by mouth 3 (three) times daily as needed for muscle spasms. 08/08/22   Dorothyann Peng, MD  ondansetron (ZOFRAN-ODT) 4 MG disintegrating tablet Take 1 tablet (4 mg total) by mouth every 8 (eight) hours as needed. 02/18/23   Karie Mainland, Amjad, PA-C  rizatriptan (MAXALT) 10 MG tablet 1 TABLET AS NEEDED MIGRAINE MAY REPEAT ONCE AFTER 2 HOURS    [provider]  topiramate (TOPAMAX) 200 MG tablet Take 200 mg by mouth daily. 11/11/20   [provider]      Allergies    Patient has no known allergies.    Review of Systems   Review of Systems  Gastrointestinal:  Positive for nausea and vomiting.  Genitourinary:  Positive for flank pain.  All other  systems reviewed and are negative.   Physical Exam Updated Vital Signs BP 136/79   Pulse 87   Temp 98.2 F (36.8 C) (Oral)   Resp 12   Ht 5\' 5"  (1.651 m)   Wt 97.5 kg   SpO2 96%   BMI 35.78 kg/m  Physical Exam Vitals and nursing note reviewed.  Constitutional:      Appearance: Normal appearance.  HENT:     Head: Normocephalic and atraumatic.     Right Ear: External ear normal.     Left Ear: External ear normal.     Nose: Nose normal.     Mouth/Throat:     Mouth: Mucous membranes are dry.     Pharynx: Oropharynx is clear.  Eyes:     Extraocular Movements: Extraocular movements intact.     Conjunctiva/sclera: Conjunctivae normal.     Pupils: Pupils are equal, round, and reactive to light.  Cardiovascular:     Rate and Rhythm: Normal rate and regular rhythm.     Pulses: Normal pulses.     Heart sounds: Normal heart sounds.  Pulmonary:     Effort: Pulmonary effort is normal.     Breath sounds: Normal breath sounds.  Abdominal:     General: Abdomen is flat. Bowel sounds are normal.  Palpations: Abdomen is soft.  Musculoskeletal:        General: Normal range of motion.     Cervical back: Normal range of motion and neck supple.  Skin:    General: Skin is warm.     Capillary Refill: Capillary refill takes less than 2 seconds.  Neurological:     General: No focal deficit present.     Mental Status: She is alert and oriented to person, place, and time.  Psychiatric:        Mood and Affect: Mood normal.        Behavior: Behavior normal.     ED Results / Procedures / Treatments   Labs (all labs ordered are listed, but only abnormal results are displayed) Labs Reviewed  COMPREHENSIVE METABOLIC PANEL - Abnormal; Notable for the following components:      Result Value   Potassium 3.3 (*)    CO2 19 (*)    Glucose, Bld 143 (*)    Creatinine, Ser 1.05 (*)    Calcium 8.4 (*)    All other components within normal limits  CBC - Abnormal; Notable for the following  components:   WBC 16.6 (*)    All other components within normal limits  LIPASE, BLOOD  URINALYSIS, ROUTINE W REFLEX MICROSCOPIC  HCG, SERUM, QUALITATIVE    EKG None  Radiology No results found.  Procedures Procedures    Medications Ordered in ED Medications  ondansetron (ZOFRAN-ODT) disintegrating tablet 4 mg (4 mg Oral Given 07/01/23 1904)  HYDROcodone-acetaminophen (NORCO/VICODIN) 5-325 MG per tablet 1 tablet (1 tablet Oral Given 07/01/23 1959)  sodium chloride 0.9 % bolus 1,000 mL (1,000 mLs Intravenous New Bag/Given 07/01/23 2109)  morphine (PF) 4 MG/ML injection 4 mg (4 mg Intravenous Given 07/01/23 2109)  HYDROmorphone (DILAUDID) injection 1 mg (1 mg Intravenous Given 07/01/23 2137)  ondansetron (ZOFRAN) injection 4 mg (4 mg Intravenous Given 07/01/23 2142)    ED Course/ Medical Decision Making/ A&P                                 Medical Decision Making Amount and/or Complexity of Data Reviewed Labs: ordered.  Risk Prescription drug management.   This patient presents to the ED for concern of abd pain, this involves an extensive number of treatment options, and is a complaint that carries with it a high risk of complications and morbidity.  The differential diagnosis includes kidney stone, uti, appendicitis, diverticulitis   Co morbidities that complicate the patient evaluation  kidney stones, htn, and migraines   Additional history obtained:  Additional history obtained from epic chart review  Lab Tests:  I Ordered, and personally interpreted labs.  The pertinent results include:  cbc with wbc elevated at 16.6, cmp nl, lip nl   Imaging Studies ordered:  I ordered imaging studies including ct renal  Pending at shift change   Cardiac Monitoring:  The patient was maintained on a cardiac monitor.  I personally viewed and interpreted the cardiac monitored which showed an underlying rhythm of: nsr   Medicines ordered and prescription drug  management:  I ordered medication including ivfs/morphine/zofran  for sx  Reevaluation of the patient after these medicines showed that the patient improved I have reviewed the patients home medicines and have made adjustments as needed   Test Considered:  ct   Problem List / ED Course:  Abd pain:  pt said sx are like prior kidney stone.  Ua and ct pending at shift change.   Reevaluation:  After the interventions noted above, I reevaluated the patient and found that they have :improved   Social Determinants of Health:  Lives at home   Dispostion:  Pending at shift change        Final Clinical Impression(s) / ED Diagnoses Final diagnoses:  Generalized abdominal pain    Rx / DC Orders ED Discharge Orders     None         Jacalyn Lefevre, MD 07/01/23 2235

## 2023-07-02 DIAGNOSIS — I10 Essential (primary) hypertension: Secondary | ICD-10-CM | POA: Diagnosis not present

## 2023-07-02 DIAGNOSIS — R1084 Generalized abdominal pain: Secondary | ICD-10-CM | POA: Diagnosis not present

## 2023-07-02 DIAGNOSIS — D72829 Elevated white blood cell count, unspecified: Secondary | ICD-10-CM | POA: Diagnosis not present

## 2023-07-02 DIAGNOSIS — Z79899 Other long term (current) drug therapy: Secondary | ICD-10-CM | POA: Diagnosis not present

## 2023-07-02 LAB — URINALYSIS, ROUTINE W REFLEX MICROSCOPIC
Bilirubin Urine: NEGATIVE
Glucose, UA: NEGATIVE mg/dL
Hgb urine dipstick: NEGATIVE
Ketones, ur: NEGATIVE mg/dL
Leukocytes,Ua: NEGATIVE
Nitrite: NEGATIVE
Protein, ur: NEGATIVE mg/dL
Specific Gravity, Urine: 1.016 (ref 1.005–1.030)
pH: 7 (ref 5.0–8.0)

## 2023-07-02 MED ORDER — SODIUM CHLORIDE 0.9 % IV BOLUS
1000.0000 mL | Freq: Once | INTRAVENOUS | Status: AC
  Administered 2023-07-02: 1000 mL via INTRAVENOUS

## 2023-07-02 MED ORDER — ONDANSETRON 4 MG PO TBDP: 4.0000 mg | ORAL_TABLET | Freq: Three times a day (TID) | ORAL | 0 refills | Status: DC | PRN

## 2023-07-02 MED ORDER — OXYCODONE-ACETAMINOPHEN 5-325 MG PO TABS: 1.0000 | ORAL_TABLET | Freq: Four times a day (QID) | ORAL | 0 refills | Status: AC | PRN

## 2023-07-02 MED ORDER — TAMSULOSIN HCL 0.4 MG PO CAPS: 0.4000 mg | ORAL_CAPSULE | Freq: Every day | ORAL | 0 refills | Status: AC

## 2023-07-02 MED ORDER — OXYCODONE-ACETAMINOPHEN 5-325 MG PO TABS
1.0000 | ORAL_TABLET | Freq: Once | ORAL | Status: AC
  Administered 2023-07-02: 1 via ORAL
  Filled 2023-07-02: qty 1

## 2023-07-02 MED ORDER — METOCLOPRAMIDE HCL 5 MG/ML IJ SOLN
10.0000 mg | Freq: Once | INTRAMUSCULAR | Status: AC
  Administered 2023-07-02: 10 mg via INTRAVENOUS
  Filled 2023-07-02: qty 2

## 2023-07-02 MED ORDER — TAMSULOSIN HCL 0.4 MG PO CAPS
0.4000 mg | ORAL_CAPSULE | Freq: Once | ORAL | Status: AC
  Administered 2023-07-02: 0.4 mg via ORAL
  Filled 2023-07-02: qty 1

## 2023-07-02 MED ORDER — POTASSIUM CHLORIDE CRYS ER 20 MEQ PO TBCR
40.0000 meq | EXTENDED_RELEASE_TABLET | Freq: Once | ORAL | Status: AC
  Administered 2023-07-02: 40 meq via ORAL
  Filled 2023-07-02: qty 2

## 2023-07-02 NOTE — Discharge Instructions (Addendum)
Prescriptions for narcotic pain medication and nausea medicine were sent to your pharmacy.  Take as needed.  Stay well-hydrated.  Take tamsulosin daily.  Call the number below to set up a close follow-up appointment with [facility].  Return to the emergency department for any worsening symptoms.

## 2023-07-03 ENCOUNTER — Other Ambulatory Visit: Payer: Self-pay | Admitting: Urology

## 2023-07-03 ENCOUNTER — Telehealth: Payer: Self-pay

## 2023-07-03 DIAGNOSIS — N201 Calculus of ureter: Secondary | ICD-10-CM | POA: Diagnosis not present

## 2023-07-03 DIAGNOSIS — N132 Hydronephrosis with renal and ureteral calculous obstruction: Secondary | ICD-10-CM | POA: Diagnosis not present

## 2023-07-03 NOTE — Transitions of Care (Post Inpatient/ED Visit) (Signed)
   07/03/2023  Name: Karin Ohlendorf MRN: [id number] DOB: [birth year]  Today's TOC FU Call Status: Today's TOC FU Call Status:: Successful TOC FU Call Completed TOC FU Call Complete Date: 07/03/23 Patient's Name and Date of Birth confirmed.  Transition Care Management Follow-up Telephone Call Date of Discharge: 07/02/23 Discharge Facility: [facility]) Type of Discharge: Inpatient Admission Primary Inpatient Discharge Diagnosis:: Generalized abdominal pain, kidney stone How have you been since you were released from the hospital?: Same Any questions or concerns?: Yes  Items Reviewed: Did you receive and understand the discharge instructions provided?: Yes Medications obtained,verified, and reconciled?: Yes (Medications Reviewed) Any new allergies since your discharge?: No Dietary orders reviewed?: No Do you have support at home?: Yes  Medications Reviewed Today: Medications Reviewed Today   Medications were not reviewed in this encounter     Home Care and Equipment/Supplies: Were Home Health Services Ordered?: No Any new equipment or medical supplies ordered?: No  Functional Questionnaire: Do you need assistance with bathing/showering or dressing?: No Do you need assistance with meal preparation?: No Do you need assistance with eating?: No Do you need assistance with getting out of bed/getting out of a chair/moving?: No Do you have difficulty managing or taking your medications?: No  Follow up appointments reviewed: PCP Follow-up appointment confirmed?: Yes Date of PCP follow-up appointment?: 07/03/23 Specialist Hospital Follow-up appointment confirmed?: Yes Date of Specialist follow-up appointment?: 07/03/23 Follow-Up Specialty Provider:: urology Do you need transportation to your follow-up appointment?: No Do you understand care options if your condition(s) worsen?: Yes-patient verbalized understanding    SIGNATURE Lisabeth Devoid, CMA

## 2023-07-05 NOTE — Progress Notes (Signed)
Pre op phone call completed. NPO after midnight. Arrive at 0600. Meds ok.

## 2023-07-10 ENCOUNTER — Other Ambulatory Visit: Payer: Self-pay

## 2023-07-10 ENCOUNTER — Encounter (HOSPITAL_BASED_OUTPATIENT_CLINIC_OR_DEPARTMENT_OTHER): Payer: Self-pay | Admitting: Urology

## 2023-07-10 ENCOUNTER — Ambulatory Visit (HOSPITAL_BASED_OUTPATIENT_CLINIC_OR_DEPARTMENT_OTHER)
Admission: RE | Admit: 2023-07-10 | Discharge: 2023-07-10 | Disposition: A | Discharge status: Home / Self Care | Payer: BC Managed Care – PPO | Source: Ambulatory Visit | Attending: Urology | Admitting: Urology

## 2023-07-10 ENCOUNTER — Encounter (HOSPITAL_BASED_OUTPATIENT_CLINIC_OR_DEPARTMENT_OTHER)
Admission: RE | Disposition: A | Discharge status: Home / Self Care | Payer: Self-pay | Source: Ambulatory Visit | Attending: Urology

## 2023-07-10 ENCOUNTER — Ambulatory Visit (HOSPITAL_COMMUNITY): Payer: BC Managed Care – PPO

## 2023-07-10 DIAGNOSIS — N132 Hydronephrosis with renal and ureteral calculous obstruction: Secondary | ICD-10-CM | POA: Insufficient documentation

## 2023-07-10 DIAGNOSIS — I10 Essential (primary) hypertension: Secondary | ICD-10-CM | POA: Insufficient documentation

## 2023-07-10 DIAGNOSIS — N201 Calculus of ureter: Secondary | ICD-10-CM | POA: Diagnosis not present

## 2023-07-10 DIAGNOSIS — J45909 Unspecified asthma, uncomplicated: Secondary | ICD-10-CM | POA: Insufficient documentation

## 2023-07-10 DIAGNOSIS — N200 Calculus of kidney: Secondary | ICD-10-CM

## 2023-07-10 HISTORY — PX: EXTRACORPOREAL SHOCK WAVE LITHOTRIPSY: SHX1557

## 2023-07-10 HISTORY — DX: Essential (primary) hypertension: I10

## 2023-07-10 HISTORY — DX: Headache, unspecified: R51.9

## 2023-07-10 LAB — POCT PREGNANCY, URINE: Preg Test, Ur: NEGATIVE

## 2023-07-10 SURGERY — LITHOTRIPSY, ESWL
Anesthesia: LOCAL | Laterality: Left

## 2023-07-10 MED ORDER — DIPHENHYDRAMINE HCL 25 MG PO CAPS
25.0000 mg | ORAL_CAPSULE | ORAL | Status: AC
Start: 2023-07-10 — End: 2023-07-10
  Administered 2023-07-10: 25 mg via ORAL

## 2023-07-10 MED ORDER — DIPHENHYDRAMINE HCL 25 MG PO CAPS
ORAL_CAPSULE | ORAL | Status: AC
  Filled 2023-07-10: qty 1

## 2023-07-10 MED ORDER — CIPROFLOXACIN HCL 500 MG PO TABS
ORAL_TABLET | ORAL | Status: AC
  Filled 2023-07-10: qty 1

## 2023-07-10 MED ORDER — TAMSULOSIN HCL 0.4 MG PO CAPS: 0.4000 mg | ORAL_CAPSULE | Freq: Every day | ORAL | 0 refills | Status: DC

## 2023-07-10 MED ORDER — CIPROFLOXACIN HCL 500 MG PO TABS
500.0000 mg | ORAL_TABLET | ORAL | Status: AC
Start: 2023-07-10 — End: 2023-07-10
  Administered 2023-07-10: 500 mg via ORAL

## 2023-07-10 MED ORDER — SODIUM CHLORIDE 0.9 % IV SOLN: INTRAVENOUS | Status: DC

## 2023-07-10 MED ORDER — DIAZEPAM 5 MG PO TABS
10.0000 mg | ORAL_TABLET | ORAL | Status: AC
  Administered 2023-07-10: 10 mg via ORAL

## 2023-07-10 MED ORDER — DIAZEPAM 5 MG PO TABS
ORAL_TABLET | ORAL | Status: AC
  Filled 2023-07-10: qty 2

## 2023-07-10 MED ORDER — TRAMADOL HCL 50 MG PO TABS: 50.0000 mg | ORAL_TABLET | Freq: Four times a day (QID) | ORAL | 0 refills | Status: DC | PRN

## 2023-07-10 NOTE — H&P (Signed)
Wendy Parks is a 42 year old female who presents today as a work in. She has a known history of kidney stones and said several shockwaves in the past. She presented to the emergency department over the weekend complaining of acute left flank pain with some associated nausea and vomiting. A CT scan was done; I reviewed the images and they show a 6 to 7 mm proximal left ureteral stone with some associated hydronephrosis. She was discharged home with Flomax and pain medication.   Today Wendy Parks is doing okay. She continues to endorse some pain with associated nausea and vomiting. Pain medication and Zofran has been helpful. She denies any fevers   KUB reviewed and shows an obvious calcification just below the transverse process for L3.     ALLERGIES: No Allergies    MEDICATIONS: Tamsulosin Hcl 0.4 mg capsule 1 capsule PO Daily  Ajovy Autoinjector 225 mg/1.5 ml auto-injector 1 PO Daily  Baclofen 10 mg tablet tablet  Botox 100 unit vial 1 PO Daily  Citalopram Hbr 20 mg tablet  Cyclobenzaprine Hcl 5 mg tablet 1 tablet PO Daily  Hydrocodone-Acetaminophen 5 mg-325 mg tablet 1 tablet PO Daily  Meloxicam 15 mg tablet 1 tablet PO Daily  Montelukast Sodium 10 mg tablet 1 tablet PO Daily  Ondansetron Hcl 4 mg tablet 1 tablet PO Daily  Rizatriptan 10 mg tablet 1 tablet PO Daily PRN  Topiramate 200 mg tablet 1 tablet PO Daily     GU PSH: No GU PSH      PSH Notes: Lithotripsy 2004 & 2010  Wisdom tooth extraction 2000  Uterine ablation 2020   NON-GU PSH: Bilateral Tubal Ligation - 2012     GU PMH: Renal calculus - 03/16/2023 Ureteral calculus - 03/16/2023, (Stable), might be visible on kub. UA clear. Disc urs, eswl and stone passage. She will cont tamsulosin. Disc diet changes to prevent stones. , - 02/24/2023, - 2019      PMH Notes: migraines   NON-GU PMH: Anxiety Arthritis Asthma GERD Hypertension    FAMILY HISTORY: father deceased at age 65 - Other mother living - Other    Notes:  Mother is 42, Father died from Mouth cancer   SOCIAL HISTORY: Marital Status: Married Current Smoking Status: Patient has never smoked.   Tobacco Use Assessment Completed: Used Tobacco in last 30 days? Social Drinker.  Drinks 1 caffeinated drink per day. Patient's occupation Runner, broadcasting/film/video.    REVIEW OF SYSTEMS:    GU Review Female:   Patient denies frequent urination, hard to postpone urination, burning /pain with urination, get up at night to urinate, leakage of urine, stream starts and stops, trouble starting your stream, have to strain to urinate, and being pregnant.  Gastrointestinal (Upper):   Patient reports vomiting. Patient denies nausea and indigestion/ heartburn.  Gastrointestinal (Lower):   Patient denies diarrhea and constipation.  Constitutional:   Patient denies fever, night sweats, weight loss, and fatigue.  Skin:   Patient denies skin rash/ lesion and itching.  Eyes:   Patient denies double vision and blurred vision.  Ears/ Nose/ Throat:   Patient denies sore throat and sinus problems.  Hematologic/Lymphatic:   Patient denies swollen glands and easy bruising.  Cardiovascular:   Patient denies leg swelling and chest pains.  Respiratory:   Patient denies cough and shortness of breath.  Endocrine:   Patient denies excessive thirst.  Musculoskeletal:   Patient denies back pain and joint pain.  Neurological:   Patient denies headaches and dizziness.  Psychologic:   Patient denies depression and anxiety.   Notes: KS on lt side, pain on lt side, stated she vomited for hrs, she went to ED, stated this one is worse than the one she had in jun     VITAL SIGNS: None   MULTI-SYSTEM PHYSICAL EXAMINATION:    Constitutional: Well-nourished. No physical deformities. Normally developed. Good grooming.  Respiratory: No labored breathing, no use of accessory muscles.   Cardiovascular: Normal temperature, normal extremity pulses, no swelling, no varicosities.   Neurologic / Psychiatric: Oriented to time, oriented to place, oriented to person. No depression, no anxiety, no agitation.  Eyes: Normal conjunctivae. Normal eyelids.  Musculoskeletal: Normal gait and station of head and neck.     Complexity of Data:  Source Of History:  Patient, Medical Record Summary  Records Review:   Previous Doctor Records, Previous Hospital Records, Previous Patient Records  Urine Test Review:   Urinalysis  X-Ray Review: Outside CT: Reviewed Films. Reviewed Report. Discussed With Patient.  KUB: Reviewed Films. Discussed With Patient.     07/03/23  Urinalysis  Urine Appearance Slightly Cloudy   Urine Color Yellow   Urine Glucose Neg mg/dL  Urine Bilirubin Neg mg/dL  Urine Ketones Neg mg/dL  Urine Specific Gravity 1.025   Urine Blood Trace ery/uL  Urine pH 5.5   Urine Protein Neg mg/dL  Urine Urobilinogen 0.2 mg/dL  Urine Nitrites Neg   Urine Leukocyte Esterase 2+ leu/uL  Urine WBC/hpf 6 - 10/hpf   Urine RBC/hpf 0 - 2/hpf   Urine Epithelial Cells 6 - 10/hpf   Urine Bacteria Few (10-25/hpf)   Urine Mucous Not Present   Urine Yeast NS (Not Seen)   Urine Trichomonas Not Present   Urine Cystals NS (Not Seen)   Urine Casts NS (Not Seen)   Urine Sperm Not Present   Other  Urine Pregnancy Test NEGATIVE    PROCEDURES:         KUB - 47425  A single view of the abdomen is obtained. Calcification present on the left just below the transverse process of L3.      Patient confirmed No Neulasta OnPro Device.           Urinalysis w/Scope Dipstick Dipstick Cont'd Micro  Color: Yellow Bilirubin: Neg mg/dL WBC/hpf: 6 - 95/GLO  Appearance: Slightly Cloudy Ketones: Neg mg/dL RBC/hpf: 0 - 2/hpf  Specific Gravity: 1.025 Blood: Trace ery/uL Bacteria: Few (10-25/hpf)  pH: 5.5 Protein: Neg mg/dL Cystals: NS (Not Seen)  Glucose: Neg mg/dL Urobilinogen: 0.2 mg/dL Casts: NS (Not Seen)    Nitrites: Neg Trichomonas: Not Present    Leukocyte Esterase: 2+ leu/uL Mucous:  Not Present      Epithelial Cells: 6 - 10/hpf      Yeast: NS (Not Seen)      Sperm: Not Present         Ketoralac 30mg  - [id number], [id number] Pt was cleansed and prepped with alcohol wipe, tolerated well, zero wasted  EJ, CMA   Qty: 30 Adm. By: Luvenia Redden  Unit: mg Lot No [id number]  Route: IM Exp. Date 05/22/2024  Freq: None Mfgr.:   Site: Left Buttock   ASSESSMENT:      ICD-10 Details  1 GU:   Ureteral calculus - N20.1 Acute, Threat to Bodily Function  2   Ureteral obstruction secondary to calculous - N13.2 Acute, Threat to Bodily Function   PLAN:            Medications New Meds: Tamsulosin Hcl 0.4  mg capsule 1 capsule PO Q HS   #20  1 Refill(s)  Ondansetron Odt 4 mg tablet,disintegrating 1 tablet PO Q 6 H PRN   #20  1 Refill(s)  Oxycodone Hcl 5 mg tablet 1 tablet PO Q 6 H PRN   #16  0 Refill(s)  Pharmacy Name:  [facility] [phone]  Address:  [address]   [place], [place] [address]  Phone:  [phone]  Fax:  [phone]            Orders Labs CULTURE, URINE, Urine Preg.  X-Rays: KUB          Schedule         Document Letter(s):  Created for Patient: Clinical Summary         Notes:   We discussed the management of urinary stones. These options include observation, ureteroscopy, and shockwave lithotripsy. We discussed which options are relevant to these particular stones. We discussed the natural history of stones as well as the complications of untreated stones and the impact on quality of life without treatment as well as with each of the above listed treatments. We also discussed the efficacy of each treatment in its ability to clear the stone burden. With any of these management options I discussed the signs and symptoms of infection and the need for emergent treatment should these be experienced. For each option we discussed the ability of each procedure to clear the patient of their stone burden.   For observation I described the risks which include but  are not limited to silent renal damage, life-threatening infection, need for emergent surgery, failure to pass stone, and pain.   For ureteroscopy I described the risks which include heart attack, stroke, pulmonary embolus, death, bleeding, infection, damage to contiguous structures, positioning injury, ureteral stricture, ureteral avulsion, ureteral injury, need for ureteral stent, inability to perform ureteroscopy, need for an interval procedure, inability to clear stone burden, stent discomfort and pain.   For shockwave lithotripsy I described the risks which include arrhythmia, kidney contusion, kidney hemorrhage, need for transfusion, pain, inability to break up stone, inability to pass stone fragments, Steinstrasse, infection associated with obstructing stones, need for different surgical procedure, need for repeat shockwave lithotripsy.    At this time patient is most interested in ESWL which I feel is reasonable. Green sheet submitted. Will also send in refills of pain medicine, Flomax, and Zofran.

## 2023-07-10 NOTE — Op Note (Signed)
See [facility] OP note scanned into chart. Also because of the size, density, location and other factors that cannot be anticipated I feel this will likely be a staged procedure. This fact supersedes any indication in the scanned [facility] operative note to the contrary.  

## 2023-07-10 NOTE — Discharge Instructions (Addendum)
See [facility] discharge instructions in chart.  Post Anesthesia Home Care Instructions  Activity: Get plenty of rest for the remainder of the day. A responsible adult should stay with you for 24 hours following the procedure.  For the next 24 hours, DO NOT: -Drive a car -Advertising copywriter -Drink alcoholic beverages -Take any medication unless instructed by your physician -Make any legal decisions or sign important papers.  Meals: Start with liquid foods such as gelatin or soup. Progress to regular foods as tolerated. Avoid greasy, spicy, heavy foods. If nausea and/or vomiting occur, drink only clear liquids until the nausea and/or vomiting subsides. Call your physician if vomiting continues.

## 2023-07-11 ENCOUNTER — Encounter (HOSPITAL_BASED_OUTPATIENT_CLINIC_OR_DEPARTMENT_OTHER): Payer: Self-pay | Admitting: Urology

## 2023-07-18 DIAGNOSIS — F4312 Post-traumatic stress disorder, chronic: Secondary | ICD-10-CM | POA: Diagnosis not present

## 2023-07-19 DIAGNOSIS — G518 Other disorders of facial nerve: Secondary | ICD-10-CM | POA: Diagnosis not present

## 2023-07-19 DIAGNOSIS — M542 Cervicalgia: Secondary | ICD-10-CM | POA: Diagnosis not present

## 2023-07-19 DIAGNOSIS — M791 Myalgia, unspecified site: Secondary | ICD-10-CM | POA: Diagnosis not present

## 2023-07-19 DIAGNOSIS — G43719 Chronic migraine without aura, intractable, without status migrainosus: Secondary | ICD-10-CM | POA: Diagnosis not present

## 2023-07-19 DIAGNOSIS — G43901 Migraine, unspecified, not intractable, with status migrainosus: Secondary | ICD-10-CM | POA: Diagnosis not present

## 2023-07-25 DIAGNOSIS — N201 Calculus of ureter: Secondary | ICD-10-CM | POA: Diagnosis not present

## 2023-08-01 ENCOUNTER — Other Ambulatory Visit (HOSPITAL_COMMUNITY)
Admission: RE | Admit: 2023-08-01 | Discharge: 2023-08-01 | Disposition: A | Discharge status: Home / Self Care | Payer: Self-pay | Source: Ambulatory Visit | Attending: Medical Genetics | Admitting: Medical Genetics

## 2023-08-01 DIAGNOSIS — Z006 Encounter for examination for normal comparison and control in clinical research program: Secondary | ICD-10-CM | POA: Insufficient documentation

## 2023-08-08 ENCOUNTER — Encounter: Payer: Self-pay | Admitting: Internal Medicine

## 2023-08-08 ENCOUNTER — Ambulatory Visit: Payer: BC Managed Care – PPO | Admitting: Internal Medicine

## 2023-08-08 VITALS — BP 120/80 | HR 77 | Temp 98.7°F | Ht 65.6 in | Wt 216.2 lb

## 2023-08-08 DIAGNOSIS — R7989 Other specified abnormal findings of blood chemistry: Secondary | ICD-10-CM | POA: Diagnosis not present

## 2023-08-08 DIAGNOSIS — E049 Nontoxic goiter, unspecified: Secondary | ICD-10-CM | POA: Diagnosis not present

## 2023-08-08 DIAGNOSIS — N6459 Other signs and symptoms in breast: Secondary | ICD-10-CM

## 2023-08-08 DIAGNOSIS — D259 Leiomyoma of uterus, unspecified: Secondary | ICD-10-CM

## 2023-08-08 DIAGNOSIS — J9811 Atelectasis: Secondary | ICD-10-CM | POA: Diagnosis not present

## 2023-08-08 DIAGNOSIS — Z2821 Immunization not carried out because of patient refusal: Secondary | ICD-10-CM

## 2023-08-08 DIAGNOSIS — F4312 Post-traumatic stress disorder, chronic: Secondary | ICD-10-CM | POA: Diagnosis not present

## 2023-08-08 DIAGNOSIS — E66812 Obesity, class 2: Secondary | ICD-10-CM

## 2023-08-08 DIAGNOSIS — E6609 Other obesity due to excess calories: Secondary | ICD-10-CM

## 2023-08-08 DIAGNOSIS — Z6835 Body mass index (BMI) 35.0-35.9, adult: Secondary | ICD-10-CM

## 2023-08-08 NOTE — Progress Notes (Signed)
I,Jameka J Llittleton, CMA,acting as a Neurosurgeon for Gwynneth Aliment, MD.,have documented all relevant documentation on the behalf of Gwynneth Aliment, MD,as directed by  Gwynneth Aliment, MD while in the presence of Gwynneth Aliment, MD.  Subjective:  Patient ID: Wendy Parks , female    DOB: 06-14-1981 , 42 y.o.   MRN: [id number]  Chief Complaint  Patient presents with   bleeding in right nipple    HPI  Patient presents today for evaluation of right nipple bleeding. She noticed the bleeding in her nipple last week. The patient reports when she woke up she saw blood on her shirt.  She has since noticed blood on her bras as well. She denies having similar sx in the past. She has not noticed a lump. Reports having normal mammogram earlier this year with GYN.      Past Medical History:  Diagnosis Date   Headache    Migranes   Hypertension    Kidney stone      Family History  Problem Relation Age of Onset   Healthy Mother    Dementia Father    Cancer Father      Current Outpatient Medications:    AJOVY 225 MG/1.5ML SOAJ, Inject 1.5 mLs into the skin every 30 (thirty) days., Disp: , Rfl:    albuterol (VENTOLIN HFA) 108 (90 Base) MCG/ACT inhaler, Inhale 2 puffs into the lungs every 6 (six) hours as needed for wheezing or shortness of breath., Disp: 18 g, Rfl: 3   amLODipine (NORVASC) 2.5 MG tablet, TAKE 1 TABLET BY MOUTH AT BEDTIME., Disp: 90 tablet, Rfl: 2   baclofen (LIORESAL) 10 MG tablet, Take 10 mg by mouth 2 (two) times daily as needed., Disp: , Rfl:    BOTOX 100 units SOLR injection, Inject 100 Units into the muscle every 3 (three) months., Disp: , Rfl:    cholecalciferol (VITAMIN D3) 25 MCG (1000 UNIT) tablet, Take 1,000 Units by mouth daily., Disp: , Rfl:    citalopram (CELEXA) 10 MG tablet, TAKE 1 TABLET BY MOUTH EVERY DAY, Disp: 90 tablet, Rfl: 1   ondansetron (ZOFRAN-ODT) 4 MG disintegrating tablet, Take 1 tablet (4 mg total) by mouth every 8 (eight) hours as needed., Disp: 20  tablet, Rfl: 0   rizatriptan (MAXALT) 10 MG tablet, Take 5 mg by mouth as needed for migraine., Disp: , Rfl:    tamsulosin (FLOMAX) 0.4 MG CAPS capsule, Take 1 capsule (0.4 mg total) by mouth daily., Disp: 7 capsule, Rfl: 0   topiramate (TOPAMAX) 200 MG tablet, Take 200 mg by mouth daily., Disp: , Rfl:    traMADol (ULTRAM) 50 MG tablet, Take 1-2 tablets (50-100 mg total) by mouth every 6 (six) hours as needed for moderate pain (pain score 4-6)., Disp: 15 tablet, Rfl: 0   ondansetron (ZOFRAN-ODT) 4 MG disintegrating tablet, Take 1 tablet (4 mg total) by mouth every 8 (eight) hours as needed for nausea or vomiting. (Patient not taking: Reported on 08/08/2023), Disp: 20 tablet, Rfl: 0   No Known Allergies   Review of Systems  Constitutional: Negative.   HENT: Negative.    Eyes: Negative.   Respiratory: Negative.    Cardiovascular: Negative.   Gastrointestinal: Negative.   Skin: Negative.   Psychiatric/Behavioral: Negative.       Today's Vitals   08/08/23 0946  BP: 120/80  Pulse: 77  Temp: 98.7 F (37.1 C)  TempSrc: Oral  Weight: 216 lb 3.2 oz (98.1 kg)  Height: 5' 5.6" (1.666 m)  PainSc: 0-No pain   Body mass index is 35.32 kg/m.  Wt Readings from Last 3 Encounters:  08/08/23 216 lb 3.2 oz (98.1 kg)  07/10/23 221 lb 9.6 oz (100.5 kg)  07/01/23 215 lb (97.5 kg)    The 10-year ASCVD risk score (Arnett DK, et al., 2019) is: 1.1%   Values used to calculate the score:     Age: 42 years     Sex: Female     Is Non-Hispanic African American: Yes     Diabetic: No     Tobacco smoker: No     Systolic Blood Pressure: 120 mmHg     Is BP treated: Yes     HDL Cholesterol: 59 mg/dL     Total Cholesterol: 196 mg/dL  Objective:  Physical Exam Vitals and nursing note reviewed.  Constitutional:      Appearance: Normal appearance.  HENT:     Head: Normocephalic and atraumatic.  Eyes:     Extraocular Movements: Extraocular movements intact.     Conjunctiva/sclera: Conjunctivae  normal.     Pupils: Pupils are equal, round, and reactive to light.  Cardiovascular:     Rate and Rhythm: Normal rate and regular rhythm.     Pulses: Normal pulses.     Heart sounds: Normal heart sounds.  Pulmonary:     Effort: Pulmonary effort is normal.     Breath sounds: Normal breath sounds.  Chest:  Breasts:    Tanner Score is 5.     Right: Nipple discharge present.     Comments: Bloody discharge with breast exam. No discrete mass identified, dense breast tissue subareolar No erythema, no warmth Genitourinary:    Comments: deferred Musculoskeletal:        General: Normal range of motion.     Cervical back: Normal range of motion and neck supple.  Skin:    General: Skin is warm and dry.  Neurological:     General: No focal deficit present.     Mental Status: She is alert and oriented to person, place, and time.  Psychiatric:        Mood and Affect: Mood normal.        Behavior: Behavior normal.         Assessment And Plan:  Bleeding from nipple in female Assessment & Plan: This was demonstrated on exam today. She agrees to diagnostic mammo and ultrasound for further evaluation. It is possible this is a benign condition, intraductal papilloma; however, cancer must be ruled out as well.   Orders: -     [place] LIMITED ULTRASOUND INCLUDING AXILLA RIGHT BREAST; Future -     Prolactin -     MM 3D DIAGNOSTIC MAMMOGRAM BILATERAL BREAST; Future  Atelectasis Assessment & Plan: CT renal stone study results reviewed in full detail. She is advised that this finding is likely due to shallow breathing. She is not having any other pulmonary issues at this time. Offered to do CXR due to her concern, she declines at this time. May practice deep breathing during commercials while watching TV. Also encouraged to increase daily activity.    Uterine leiomyoma, unspecified location Assessment & Plan: Again, CT renal stone study results reviewed in full detail.  I planned to order pelvic/TV  ultrasound; however, states she had one last year with Dr. Su Hilt. the plan is to have hysterectomy, she doesn't agree at this time.    Abnormal thyroid blood test -     TSH + free T4  Class 2  obesity due to excess calories without serious comorbidity with body mass index (BMI) of 35.0 to 35.9 in adult Assessment & Plan: She is encouraged to strive for BMI less than 30 to decrease cardiac risk. Advised to aim for at least 150 minutes of exercise per week.    COVID-19 vaccination declined    Return if symptoms worsen or fail to improve.  Patient was given opportunity to ask questions. Patient verbalized understanding of the plan and was able to repeat key elements of the plan. All questions were answered to their satisfaction.    I, Gwynneth Aliment, MD, have reviewed all documentation for this visit. The documentation on 08/08/23 for the exam, diagnosis, procedures, and orders are all accurate and complete.  IF YOU HAVE BEEN REFERRED TO A SPECIALIST, IT MAY TAKE 1-2 WEEKS TO SCHEDULE/PROCESS THE REFERRAL. IF YOU HAVE NOT HEARD FROM US/SPECIALIST IN TWO WEEKS, PLEASE GIVE [place] A CALL AT [phone].

## 2023-08-08 NOTE — Assessment & Plan Note (Signed)
This was demonstrated on exam today. She agrees to diagnostic mammo and ultrasound for further evaluation. It is possible this is a benign condition, intraductal papilloma; however, cancer must be ruled out as well.

## 2023-08-08 NOTE — Assessment & Plan Note (Signed)
Again, CT renal stone study results reviewed in full detail.  I planned to order pelvic/TV ultrasound; however, states she had one last year with Dr. Su Hilt. the plan is to have hysterectomy, she doesn't agree at this time.

## 2023-08-08 NOTE — Assessment & Plan Note (Signed)
 She is encouraged to strive for BMI less than 30 to decrease cardiac risk. Advised to aim for at least 150 minutes of exercise per week.

## 2023-08-08 NOTE — Assessment & Plan Note (Signed)
CT renal stone study results reviewed in full detail. She is advised that this finding is likely due to shallow breathing. She is not having any other pulmonary issues at this time. Offered to do CXR due to her concern, she declines at this time. May practice deep breathing during commercials while watching TV. Also encouraged to increase daily activity.

## 2023-08-09 LAB — TSH+FREE T4
Free T4: 1.04 ng/dL (ref 0.82–1.77)
TSH: 1.07 u[IU]/mL (ref 0.450–4.500)

## 2023-08-09 LAB — PROLACTIN: Prolactin: 56.9 ng/mL — ABNORMAL HIGH (ref 4.8–33.4)

## 2023-08-12 LAB — GENECONNECT MOLECULAR SCREEN: Genetic Analysis Overall Interpretation: NEGATIVE

## 2023-08-21 DIAGNOSIS — M62830 Muscle spasm of back: Secondary | ICD-10-CM | POA: Diagnosis not present

## 2023-08-21 DIAGNOSIS — M9901 Segmental and somatic dysfunction of cervical region: Secondary | ICD-10-CM | POA: Diagnosis not present

## 2023-08-21 DIAGNOSIS — M5413 Radiculopathy, cervicothoracic region: Secondary | ICD-10-CM | POA: Diagnosis not present

## 2023-08-21 DIAGNOSIS — M9903 Segmental and somatic dysfunction of lumbar region: Secondary | ICD-10-CM | POA: Diagnosis not present

## 2023-08-22 DIAGNOSIS — F4312 Post-traumatic stress disorder, chronic: Secondary | ICD-10-CM | POA: Diagnosis not present

## 2023-08-28 DIAGNOSIS — M62830 Muscle spasm of back: Secondary | ICD-10-CM | POA: Diagnosis not present

## 2023-08-28 DIAGNOSIS — M9903 Segmental and somatic dysfunction of lumbar region: Secondary | ICD-10-CM | POA: Diagnosis not present

## 2023-08-28 DIAGNOSIS — M5413 Radiculopathy, cervicothoracic region: Secondary | ICD-10-CM | POA: Diagnosis not present

## 2023-08-28 DIAGNOSIS — M9901 Segmental and somatic dysfunction of cervical region: Secondary | ICD-10-CM | POA: Diagnosis not present

## 2023-08-30 DIAGNOSIS — M542 Cervicalgia: Secondary | ICD-10-CM | POA: Diagnosis not present

## 2023-08-30 DIAGNOSIS — G43901 Migraine, unspecified, not intractable, with status migrainosus: Secondary | ICD-10-CM | POA: Diagnosis not present

## 2023-08-30 DIAGNOSIS — G518 Other disorders of facial nerve: Secondary | ICD-10-CM | POA: Diagnosis not present

## 2023-08-30 DIAGNOSIS — M791 Myalgia, unspecified site: Secondary | ICD-10-CM | POA: Diagnosis not present

## 2023-09-04 ENCOUNTER — Ambulatory Visit (INDEPENDENT_AMBULATORY_CARE_PROVIDER_SITE_OTHER): Payer: BC Managed Care – PPO | Admitting: Internal Medicine

## 2023-09-04 ENCOUNTER — Encounter: Payer: Self-pay | Admitting: Internal Medicine

## 2023-09-04 VITALS — BP 130/82 | HR 81 | Temp 98.8°F | Ht 65.0 in | Wt 221.4 lb

## 2023-09-04 DIAGNOSIS — R7309 Other abnormal glucose: Secondary | ICD-10-CM

## 2023-09-04 DIAGNOSIS — R7989 Other specified abnormal findings of blood chemistry: Secondary | ICD-10-CM | POA: Diagnosis not present

## 2023-09-04 DIAGNOSIS — I10 Essential (primary) hypertension: Secondary | ICD-10-CM

## 2023-09-04 DIAGNOSIS — N6459 Other signs and symptoms in breast: Secondary | ICD-10-CM

## 2023-09-04 DIAGNOSIS — Z0000 Encounter for general adult medical examination without abnormal findings: Secondary | ICD-10-CM

## 2023-09-04 DIAGNOSIS — R10814 Left lower quadrant abdominal tenderness: Secondary | ICD-10-CM

## 2023-09-04 DIAGNOSIS — R0982 Postnasal drip: Secondary | ICD-10-CM

## 2023-09-04 DIAGNOSIS — E66812 Obesity, class 2: Secondary | ICD-10-CM

## 2023-09-04 DIAGNOSIS — J309 Allergic rhinitis, unspecified: Secondary | ICD-10-CM | POA: Diagnosis not present

## 2023-09-04 DIAGNOSIS — Z6836 Body mass index (BMI) 36.0-36.9, adult: Secondary | ICD-10-CM

## 2023-09-04 MED ORDER — NOREL AD 4-10-325 MG PO TABS: ORAL_TABLET | ORAL | 0 refills | Status: DC

## 2023-09-04 NOTE — Assessment & Plan Note (Signed)
 She is encouraged to strive for BMI less than 30 to decrease cardiac risk. Advised to aim for at least 150 minutes of exercise per week.

## 2023-09-04 NOTE — Progress Notes (Signed)
 I,Wendy Parks, CMA,acting as a neurosurgeon for Wendy LOISE Slocumb, MD.,have documented all relevant documentation on the behalf of Wendy LOISE Slocumb, MD,as directed by  Wendy LOISE Slocumb, MD while in the presence of Wendy LOISE Slocumb, MD.  Subjective:    Patient ID: Wendy Parks , female    DOB: 1981-08-21 , 43 y.o.   MRN: [id number]  Chief Complaint  Patient presents with   Annual Exam   Hypertension    HPI  She is here today for a full physical examination.  She is followed by Dr. Henry for her Gyn care. She was last seen June 2024.  She reports compliance with medications. Denies headache, chest pain & sob.   She reports feeling bad Saturday afternoon. She then experienced drainage from her sinuses. She denies fever. She does work in the ER on occasion.    Hypertension This is a new problem. The current episode started more than 1 month ago. The problem has been gradually improving since onset. Pertinent negatives include no blurred vision, chest pain, palpitations or shortness of breath. Risk factors for coronary artery disease include obesity. Past treatments include calcium channel blockers. The current treatment provides moderate improvement.     Past Medical History:  Diagnosis Date   Headache    Migranes   Hypertension    Kidney stone      Family History  Problem Relation Age of Onset   Healthy Mother    Dementia Father    Cancer Father      Current Outpatient Medications:    AJOVY 225 MG/1.5ML SOAJ, Inject 1.5 mLs into the skin every 30 (thirty) days., Disp: , Rfl:    albuterol  (VENTOLIN  HFA) 108 (90 Base) MCG/ACT inhaler, Inhale 2 puffs into the lungs every 6 (six) hours as needed for wheezing or shortness of breath., Disp: 18 g, Rfl: 3   amLODipine  (NORVASC ) 2.5 MG tablet, TAKE 1 TABLET BY MOUTH AT BEDTIME., Disp: 90 tablet, Rfl: 2   baclofen (LIORESAL) 10 MG tablet, Take 10 mg by mouth 2 (two) times daily as needed., Disp: , Rfl:    BOTOX 100 units SOLR injection,  Inject 100 Units into the muscle every 3 (three) months., Disp: , Rfl:    Chlorphen-PE-Acetaminophen  (NOREL AD) 4-10-325 MG TABS, One tab po twice daily prn, Disp: 20 tablet, Rfl: 0   cholecalciferol (VITAMIN D3) 25 MCG (1000 UNIT) tablet, Take 1,000 Units by mouth daily., Disp: , Rfl:    citalopram  (CELEXA ) 10 MG tablet, TAKE 1 TABLET BY MOUTH EVERY DAY, Disp: 90 tablet, Rfl: 1   rizatriptan (MAXALT) 10 MG tablet, Take 5 mg by mouth as needed for migraine., Disp: , Rfl:    tamsulosin  (FLOMAX ) 0.4 MG CAPS capsule, Take 1 capsule (0.4 mg total) by mouth daily., Disp: 7 capsule, Rfl: 0   No Known Allergies    The patient states she uses tubal ligation for birth control. Patient's last menstrual period was 08/12/2023 (exact date).. Negative for Dysmenorrhea. Negative for: breast discharge, breast lump(s), breast pain and breast self exam. Associated symptoms include abnormal vaginal bleeding. Pertinent negatives include abnormal bleeding (hematology), anxiety, decreased libido, depression, difficulty falling sleep, dyspareunia, history of infertility, nocturia, sexual dysfunction, sleep disturbances, urinary incontinence, urinary urgency, vaginal discharge and vaginal itching. Diet regular.The patient states her exercise level is    . The patient's tobacco use is:  Social History   Tobacco Use  Smoking Status Never  Smokeless Tobacco Never  . She has been exposed to passive  smoke. The patient's alcohol use is:  Social History   Substance and Sexual Activity  Alcohol Use Yes   Comment: occasionally    Review of Systems  Constitutional: Negative.   HENT:  Positive for postnasal drip and rhinorrhea.   Eyes: Negative.  Negative for blurred vision.  Respiratory: Negative.  Negative for shortness of breath.   Cardiovascular: Negative.  Negative for chest pain and palpitations.  Gastrointestinal: Negative.   Endocrine: Negative.   Genitourinary: Negative.   Musculoskeletal: Negative.   Skin:  Negative.   Allergic/Immunologic: Negative.   Neurological: Negative.   Hematological: Negative.   Psychiatric/Behavioral: Negative.       Today's Vitals   09/04/23 1048  BP: 130/82  Pulse: 81  Temp: 98.8 F (37.1 C)  SpO2: 98%  Weight: 221 lb 6.4 oz (100.4 kg)  Height: 5' 5 (1.651 m)   Body mass index is 36.84 kg/m.  Wt Readings from Last 3 Encounters:  09/04/23 221 lb 6.4 oz (100.4 kg)  08/08/23 216 lb 3.2 oz (98.1 kg)  07/10/23 221 lb 9.6 oz (100.5 kg)     Objective:  Physical Exam Vitals and nursing note reviewed.  Constitutional:      Appearance: Normal appearance.  HENT:     Head: Normocephalic and atraumatic.     Right Ear: Tympanic membrane, ear canal and external ear normal.     Left Ear: Tympanic membrane, ear canal and external ear normal.     Nose: Nose normal.     Mouth/Throat:     Mouth: Mucous membranes are moist.     Pharynx: Oropharynx is clear.  Eyes:     Extraocular Movements: Extraocular movements intact.     Conjunctiva/sclera: Conjunctivae normal.     Pupils: Pupils are equal, round, and reactive to light.  Cardiovascular:     Rate and Rhythm: Normal rate and regular rhythm.     Pulses: Normal pulses.     Heart sounds: Normal heart sounds.  Pulmonary:     Effort: Pulmonary effort is normal.     Breath sounds: Normal breath sounds.  Chest:  Breasts:    Tanner Score is 5.     Right: Nipple discharge present.     Left: Normal.  Abdominal:     General: Bowel sounds are normal.     Palpations: Abdomen is soft.     Tenderness: There is abdominal tenderness.     Comments: Obese, soft LLQ tenderness  Genitourinary:    Comments: deferred Musculoskeletal:        General: Normal range of motion.     Cervical back: Normal range of motion and neck supple.  Skin:    General: Skin is warm and dry.  Neurological:     General: No focal deficit present.     Mental Status: She is alert and oriented to person, place, and time.  Psychiatric:         Mood and Affect: Mood normal.        Behavior: Behavior normal.         Assessment And Plan:     Encounter for annual health examination Assessment & Plan: A full exam was performed.  Importance of monthly self breast exams was discussed with the patient.  She is advised to get 30-45 minutes of regular exercise, no less than four to five days per week. Both weight-bearing and aerobic exercises are recommended.  She is advised to follow a healthy diet with at least six fruits/veggies per day, decrease  intake of red meat and other saturated fats and to increase fish intake to twice weekly.  Meats/fish should not be fried -- baked, boiled or broiled is preferable. It is also important to cut back on your sugar intake.  Be sure to read labels - try to avoid anything with added sugar, high fructose corn syrup or other sweeteners.  If you must use a sweetener, you can try stevia or monkfruit.  It is also important to avoid artificially sweetened foods/beverages and diet drinks. Lastly, wear SPF 50 sunscreen on exposed skin and when in direct sunlight for an extended period of time.  Be sure to avoid fast food restaurants and aim for at least 60 ounces of water daily.      Orders: -     CBC -     CMP14+EGFR -     Lipid panel -     Hemoglobin A1c  Primary hypertension Assessment & Plan: Chronic, fair control. Goal BP<120/80.  EKG performed, NSR w/o acute changes.  She will continue with amlodipine  2.5mg  daily for now. She is reminded to follow a low sodium diet. She will f/u in 6 months for re-evaluation.   Orders: -     POCT urinalysis dipstick -     Microalbumin / creatinine urine ratio -     EKG 12-Lead  Allergic rhinitis with postnasal drip Assessment & Plan: She was given samples/rx Norel AD to take one twice daily as needed. She will let me know if her sx persist/worsen.    Bleeding from nipple in female Assessment & Plan: Sx have persisted. Unfortunately, diagnostic mammo and  u/s are not scheduled until the end of the month. I will notify her GYN of findings and make further recommendations once her labs are available for review.    Other abnormal glucose Assessment & Plan: Previous labs reviewed, her A1c has been elevated in the past. I will check an A1c today. Reminded to avoid refined sugars including sugary drinks/foods and processed meats including bacon, sausages and deli meats.    Orders: -     POCT urinalysis dipstick -     Microalbumin / creatinine urine ratio -     EKG 12-Lead  Left lower quadrant abdominal tenderness without rebound tenderness Assessment & Plan: Pt does report h/o constipation, she currently takes stool softeners as needed.  Last BM was today. She will let me know if sx persist/worsen.    Elevated prolactin level Assessment & Plan: Labs initially drawn when she presented with c/o nipple discharge. Labs drawn after nipple examination. Will recheck labs today.   Orders: -     Prolactin  Class 2 severe obesity due to excess calories with serious comorbidity and body mass index (BMI) of 36.0 to 36.9 in adult [facility]) Assessment & Plan: She is encouraged to strive for BMI less than 30 to decrease cardiac risk. Advised to aim for at least 150 minutes of exercise per week.    Other orders -     Norel AD; One tab po twice daily prn  Dispense: 20 tablet; Refill: 0  She is encouraged to strive for BMI less than 30 to decrease cardiac risk. Advised to aim for at least 150 minutes of exercise per week.    Return for 1 year physical, 6 month bp. Patient was given opportunity to ask questions. Patient verbalized understanding of the plan and was able to repeat key elements of the plan. All questions were answered to their satisfaction.  I, Wendy LOISE Slocumb, MD, have reviewed all documentation for this visit. The documentation on 09/04/23 for the exam, diagnosis, procedures, and orders are all accurate and complete.

## 2023-09-04 NOTE — Assessment & Plan Note (Signed)
 Chronic, fair control. Goal BP<120/80.  EKG performed, NSR w/o acute changes.  She will continue with amlodipine 2.5mg  daily for now. She is reminded to follow a low sodium diet. She will f/u in 6 months for re-evaluation.

## 2023-09-04 NOTE — Assessment & Plan Note (Signed)

## 2023-09-04 NOTE — Assessment & Plan Note (Signed)
 Previous labs reviewed, her A1c has been elevated in the past. I will check an A1c today. Reminded to avoid refined sugars including sugary drinks/foods and processed meats including bacon, sausages and deli meats.

## 2023-09-04 NOTE — Assessment & Plan Note (Signed)
 Sx have persisted. Unfortunately, diagnostic mammo and u/s are not scheduled until the end of the month. I will notify her GYN of findings and make further recommendations once her labs are available for review.

## 2023-09-04 NOTE — Assessment & Plan Note (Signed)
 She was given samples/rx Norel AD to take one twice daily as needed. She will let me know if her sx persist/worsen.

## 2023-09-04 NOTE — Assessment & Plan Note (Signed)
 Pt does report h/o constipation, she currently takes stool softeners as needed.  Last BM was today. She will let me know if sx persist/worsen.

## 2023-09-04 NOTE — Patient Instructions (Signed)

## 2023-09-04 NOTE — Assessment & Plan Note (Signed)
 Labs initially drawn when she presented with c/o nipple discharge. Labs drawn after nipple examination. Will recheck labs today.

## 2023-09-05 DIAGNOSIS — F4312 Post-traumatic stress disorder, chronic: Secondary | ICD-10-CM | POA: Diagnosis not present

## 2023-09-05 LAB — CBC
Hematocrit: 39 % (ref 34.0–46.6)
Hemoglobin: 13.1 g/dL (ref 11.1–15.9)
MCH: 29.8 pg (ref 26.6–33.0)
MCHC: 33.6 g/dL (ref 31.5–35.7)
MCV: 89 fL (ref 79–97)
Platelets: 330 10*3/uL (ref 150–450)
RBC: 4.4 x10E6/uL (ref 3.77–5.28)
RDW: 12.4 % (ref 11.7–15.4)
WBC: 10.5 10*3/uL (ref 3.4–10.8)

## 2023-09-05 LAB — CMP14+EGFR
ALT: 13 [IU]/L (ref 0–32)
AST: 15 [IU]/L (ref 0–40)
Albumin: 4.1 g/dL (ref 3.9–4.9)
Alkaline Phosphatase: 80 [IU]/L (ref 44–121)
BUN/Creatinine Ratio: 14 (ref 9–23)
BUN: 11 mg/dL (ref 6–24)
Bilirubin Total: 0.3 mg/dL (ref 0.0–1.2)
CO2: 25 mmol/L (ref 20–29)
Calcium: 9.4 mg/dL (ref 8.7–10.2)
Chloride: 101 mmol/L (ref 96–106)
Creatinine, Ser: 0.79 mg/dL (ref 0.57–1.00)
Globulin, Total: 2.7 g/dL (ref 1.5–4.5)
Glucose: 90 mg/dL (ref 70–99)
Potassium: 4.5 mmol/L (ref 3.5–5.2)
Sodium: 139 mmol/L (ref 134–144)
Total Protein: 6.8 g/dL (ref 6.0–8.5)
eGFR: 96 mL/min/{1.73_m2} (ref >59)

## 2023-09-05 LAB — MICROALBUMIN / CREATININE URINE RATIO
Creatinine, Urine: 204.7 mg/dL
Microalb/Creat Ratio: 11 mg/g{creat} (ref 0–29)
Microalbumin, Urine: 21.6 ug/mL

## 2023-09-05 LAB — LIPID PANEL
Chol/HDL Ratio: 3.1 {ratio} (ref 0.0–4.4)
Cholesterol, Total: 158 mg/dL (ref 100–199)
HDL: 51 mg/dL (ref >39)
LDL Chol Calc (NIH): 88 mg/dL (ref 0–99)
Triglycerides: 104 mg/dL (ref 0–149)
VLDL Cholesterol Cal: 19 mg/dL (ref 5–40)

## 2023-09-05 LAB — HEMOGLOBIN A1C
Est. average glucose Bld gHb Est-mCnc: 134 mg/dL
Hgb A1c MFr Bld: 6.3 % — ABNORMAL HIGH (ref 4.8–5.6)

## 2023-09-05 LAB — PROLACTIN: Prolactin: 39 ng/mL — ABNORMAL HIGH (ref 4.8–33.4)

## 2023-09-10 ENCOUNTER — Encounter: Payer: Self-pay | Admitting: Internal Medicine

## 2023-09-18 ENCOUNTER — Other Ambulatory Visit: Payer: Self-pay | Admitting: Internal Medicine

## 2023-09-19 DIAGNOSIS — F4312 Post-traumatic stress disorder, chronic: Secondary | ICD-10-CM | POA: Diagnosis not present

## 2023-09-20 ENCOUNTER — Other Ambulatory Visit: Payer: Self-pay | Admitting: Internal Medicine

## 2023-09-20 ENCOUNTER — Ambulatory Visit
Admission: RE | Admit: 2023-09-20 | Discharge: 2023-09-20 | Disposition: A | Discharge status: Home / Self Care | Payer: BC Managed Care – PPO | Source: Ambulatory Visit | Attending: Internal Medicine | Admitting: Internal Medicine

## 2023-09-20 DIAGNOSIS — N6459 Other signs and symptoms in breast: Secondary | ICD-10-CM

## 2023-09-20 DIAGNOSIS — Z2821 Immunization not carried out because of patient refusal: Secondary | ICD-10-CM

## 2023-09-20 DIAGNOSIS — N6341 Unspecified lump in right breast, subareolar: Secondary | ICD-10-CM | POA: Diagnosis not present

## 2023-09-20 DIAGNOSIS — J9811 Atelectasis: Secondary | ICD-10-CM

## 2023-09-20 DIAGNOSIS — N6452 Nipple discharge: Secondary | ICD-10-CM | POA: Diagnosis not present

## 2023-09-20 DIAGNOSIS — E6609 Other obesity due to excess calories: Secondary | ICD-10-CM

## 2023-09-20 DIAGNOSIS — N6310 Unspecified lump in the right breast, unspecified quadrant: Secondary | ICD-10-CM

## 2023-09-20 DIAGNOSIS — R7989 Other specified abnormal findings of blood chemistry: Secondary | ICD-10-CM

## 2023-09-20 DIAGNOSIS — D259 Leiomyoma of uterus, unspecified: Secondary | ICD-10-CM

## 2023-09-21 ENCOUNTER — Ambulatory Visit
Admission: RE | Admit: 2023-09-21 | Discharge: 2023-09-21 | Disposition: A | Discharge status: Home / Self Care | Payer: BC Managed Care – PPO | Source: Ambulatory Visit | Attending: Internal Medicine | Admitting: Internal Medicine

## 2023-09-21 DIAGNOSIS — N6310 Unspecified lump in the right breast, unspecified quadrant: Secondary | ICD-10-CM

## 2023-09-21 DIAGNOSIS — N6341 Unspecified lump in right breast, subareolar: Secondary | ICD-10-CM | POA: Diagnosis not present

## 2023-09-21 DIAGNOSIS — N6011 Diffuse cystic mastopathy of right breast: Secondary | ICD-10-CM | POA: Diagnosis not present

## 2023-09-21 HISTORY — PX: BREAST BIOPSY: SHX20

## 2023-09-22 LAB — SURGICAL PATHOLOGY

## 2023-09-23 ENCOUNTER — Other Ambulatory Visit: Payer: Self-pay | Admitting: Internal Medicine

## 2023-10-03 DIAGNOSIS — F4312 Post-traumatic stress disorder, chronic: Secondary | ICD-10-CM | POA: Diagnosis not present

## 2023-10-06 ENCOUNTER — Other Ambulatory Visit: Payer: Self-pay | Admitting: Surgery

## 2023-10-06 DIAGNOSIS — N6452 Nipple discharge: Secondary | ICD-10-CM

## 2023-10-11 DIAGNOSIS — G43719 Chronic migraine without aura, intractable, without status migrainosus: Secondary | ICD-10-CM | POA: Diagnosis not present

## 2023-10-17 DIAGNOSIS — F4312 Post-traumatic stress disorder, chronic: Secondary | ICD-10-CM | POA: Diagnosis not present

## 2023-10-18 DIAGNOSIS — M542 Cervicalgia: Secondary | ICD-10-CM | POA: Diagnosis not present

## 2023-10-18 DIAGNOSIS — M791 Myalgia, unspecified site: Secondary | ICD-10-CM | POA: Diagnosis not present

## 2023-10-18 DIAGNOSIS — G43719 Chronic migraine without aura, intractable, without status migrainosus: Secondary | ICD-10-CM | POA: Diagnosis not present

## 2023-10-18 DIAGNOSIS — G518 Other disorders of facial nerve: Secondary | ICD-10-CM | POA: Diagnosis not present

## 2023-11-13 ENCOUNTER — Ambulatory Visit
Admission: RE | Admit: 2023-11-13 | Discharge: 2023-11-13 | Disposition: A | Discharge status: Home / Self Care | Payer: BC Managed Care – PPO | Source: Ambulatory Visit | Attending: Surgery | Admitting: Surgery

## 2023-11-13 DIAGNOSIS — N6021 Fibroadenosis of right breast: Secondary | ICD-10-CM | POA: Diagnosis not present

## 2023-11-13 DIAGNOSIS — N6452 Nipple discharge: Secondary | ICD-10-CM | POA: Diagnosis not present

## 2023-11-13 DIAGNOSIS — N6325 Unspecified lump in the left breast, overlapping quadrants: Secondary | ICD-10-CM | POA: Diagnosis not present

## 2023-11-13 DIAGNOSIS — N6313 Unspecified lump in the right breast, lower outer quadrant: Secondary | ICD-10-CM | POA: Diagnosis not present

## 2023-11-13 MED ORDER — GADOPICLENOL 0.5 MMOL/ML IV SOLN
10.0000 mL | Freq: Once | INTRAVENOUS | Status: AC | PRN
  Administered 2023-11-13: 10 mL via INTRAVENOUS

## 2023-11-14 ENCOUNTER — Other Ambulatory Visit: Payer: Self-pay | Admitting: Surgery

## 2023-11-14 DIAGNOSIS — N6341 Unspecified lump in right breast, subareolar: Secondary | ICD-10-CM

## 2023-11-14 DIAGNOSIS — N6452 Nipple discharge: Secondary | ICD-10-CM

## 2023-11-15 ENCOUNTER — Other Ambulatory Visit: Payer: Self-pay | Admitting: Surgery

## 2023-11-15 DIAGNOSIS — N6341 Unspecified lump in right breast, subareolar: Secondary | ICD-10-CM

## 2023-11-15 DIAGNOSIS — N6452 Nipple discharge: Secondary | ICD-10-CM

## 2023-11-17 ENCOUNTER — Ambulatory Visit
Admission: RE | Admit: 2023-11-17 | Discharge: 2023-11-17 | Disposition: A | Discharge status: Home / Self Care | Source: Ambulatory Visit | Attending: Surgery | Admitting: Surgery

## 2023-11-17 ENCOUNTER — Ambulatory Visit

## 2023-11-17 DIAGNOSIS — D241 Benign neoplasm of right breast: Secondary | ICD-10-CM | POA: Diagnosis not present

## 2023-11-17 DIAGNOSIS — N6452 Nipple discharge: Secondary | ICD-10-CM

## 2023-11-17 DIAGNOSIS — N6341 Unspecified lump in right breast, subareolar: Secondary | ICD-10-CM

## 2023-11-17 DIAGNOSIS — R928 Other abnormal and inconclusive findings on diagnostic imaging of breast: Secondary | ICD-10-CM | POA: Diagnosis not present

## 2023-11-17 DIAGNOSIS — N6011 Diffuse cystic mastopathy of right breast: Secondary | ICD-10-CM | POA: Diagnosis not present

## 2023-11-17 DIAGNOSIS — R92331 Mammographic heterogeneous density, right breast: Secondary | ICD-10-CM | POA: Diagnosis not present

## 2023-11-17 MED ORDER — GADOPICLENOL 0.5 MMOL/ML IV SOLN
10.0000 mL | Freq: Once | INTRAVENOUS | Status: AC | PRN
  Administered 2023-11-17: 10 mL via INTRAVENOUS

## 2023-11-20 ENCOUNTER — Other Ambulatory Visit: Payer: Self-pay | Admitting: Surgery

## 2023-11-20 ENCOUNTER — Encounter: Payer: Self-pay | Admitting: Internal Medicine

## 2023-11-20 DIAGNOSIS — D241 Benign neoplasm of right breast: Secondary | ICD-10-CM

## 2023-11-20 DIAGNOSIS — N6310 Unspecified lump in the right breast, unspecified quadrant: Secondary | ICD-10-CM

## 2023-11-20 LAB — SURGICAL PATHOLOGY

## 2023-11-27 ENCOUNTER — Other Ambulatory Visit: Payer: Self-pay | Admitting: Surgery

## 2023-11-27 ENCOUNTER — Ambulatory Visit
Admission: RE | Admit: 2023-11-27 | Discharge: 2023-11-27 | Disposition: A | Discharge status: Home / Self Care | Source: Ambulatory Visit | Attending: Surgery | Admitting: Surgery

## 2023-11-27 DIAGNOSIS — D241 Benign neoplasm of right breast: Secondary | ICD-10-CM

## 2023-11-27 DIAGNOSIS — R92331 Mammographic heterogeneous density, right breast: Secondary | ICD-10-CM | POA: Diagnosis not present

## 2023-11-27 DIAGNOSIS — N6310 Unspecified lump in the right breast, unspecified quadrant: Secondary | ICD-10-CM

## 2023-11-27 DIAGNOSIS — N6315 Unspecified lump in the right breast, overlapping quadrants: Secondary | ICD-10-CM | POA: Diagnosis not present

## 2023-11-27 HISTORY — PX: BREAST BIOPSY: SHX20

## 2023-11-28 LAB — SURGICAL PATHOLOGY

## 2023-11-29 DIAGNOSIS — G43719 Chronic migraine without aura, intractable, without status migrainosus: Secondary | ICD-10-CM | POA: Diagnosis not present

## 2023-11-29 DIAGNOSIS — G518 Other disorders of facial nerve: Secondary | ICD-10-CM | POA: Diagnosis not present

## 2023-11-29 DIAGNOSIS — M791 Myalgia, unspecified site: Secondary | ICD-10-CM | POA: Diagnosis not present

## 2023-11-29 DIAGNOSIS — M542 Cervicalgia: Secondary | ICD-10-CM | POA: Diagnosis not present

## 2023-12-01 ENCOUNTER — Ambulatory Visit: Payer: Self-pay | Admitting: Surgery

## 2023-12-01 DIAGNOSIS — N6452 Nipple discharge: Secondary | ICD-10-CM | POA: Diagnosis not present

## 2023-12-01 DIAGNOSIS — D241 Benign neoplasm of right breast: Secondary | ICD-10-CM | POA: Diagnosis not present

## 2023-12-01 NOTE — H&P (Signed)
 Subjective   Chief Complaint: Follow-up (Rt intraductal papilloma)     History of Present Illness: Wendy Parks is a 43 y.o. female who is seen today as an office consultation at the request of Dr. Corliss Skains for evaluation of Follow-up (Rt intraductal papilloma) .   This is a 43 year old female in good health who presents with a one month history of intermittent right nipple discharge.  Initially, this appeared bloody, but it is now clear serous fluid.  No history of trauma.  No previous breast surgery.  No family history of breast cancer.  Mammogram and US showed a 5 mm mass in the retroareolar space.  Biopsy showed only fibrocystic changes.  She continues to have nipple discharge.  After her initial visit, we obtained an MRI of her breast.  This resulted in 2 additional MRI guided biopsies.  In the retroareolar space, there is a 2 cm anterior to posterior area of abnormal enhancement.  This was biopsied and revealed intraductal papilloma.  Another biopsy revealed a fibroadenoma in the right breast at 9:00.  She has a separate fibroadenoma in the lower inner right quadrant.  Both of these fibroadenomas are relatively small.  She presents now to discuss the ongoing nipple discharge and the retroareolar intraductal papilloma.   Review of Systems: A complete review of systems was obtained from the patient.  I have reviewed this information and discussed as appropriate with the patient.  See HPI as well for other ROS.  Review of Systems  Constitutional: Negative.   HENT: Negative.    Eyes: Negative.   Respiratory: Negative.    Cardiovascular: Negative.   Gastrointestinal: Negative.   Genitourinary: Negative.   Musculoskeletal: Negative.   Skin: Negative.   Neurological: Negative.   Endo/Heme/Allergies: Negative.   Psychiatric/Behavioral: Negative.        Medical History: Past Medical History:  Diagnosis Date   GERD (gastroesophageal reflux disease)    Hypertension     Patient  Active Problem List  Diagnosis   Bloody discharge from right nipple   Adjustment disorder with anxious mood   Amenorrhea   Chronic migraine with aura without status migrainosus, not intractable   Abnormal thyroid blood test   Class 2 severe obesity due to excess calories with serious comorbidity and body mass index (BMI) of 36.0 to 36.9 in adult (CMS/HHS-HCC)   Fibroadenoma of right breast   Herpes simplex   History of hypertension    History reviewed. No pertinent surgical history.   No Known Allergies  Current Outpatient Medications on File Prior to Visit  Medication Sig Dispense Refill   AJOVY AUTOINJECTOR 225 mg/1.5 mL AtIn Inject 1.5 mLs subcutaneously     albuterol MDI, PROVENTIL, VENTOLIN, PROAIR, HFA 90 mcg/actuation inhaler Inhale 2 Inhalations into the lungs every 6 (six) hours as needed     amLODIPine (NORVASC) 2.5 MG tablet Take 1 tablet by mouth at bedtime     baclofen (LIORESAL) 10 MG tablet Take 1 tablet 4 times a day by oral route.     citalopram (CELEXA) 10 MG tablet Take 1 tablet by mouth once daily     onabotulinumtoxinA (BOTOX) 100 unit injection Inject 100 Units into the muscle     rizatriptan (MAXALT) 10 MG tablet Take 5 mg by mouth     No current facility-administered medications on file prior to visit.    Family History  Problem Relation Age of Onset   Other (oral cancer) Father      Social History  Tobacco Use  Smoking Status Never  Smokeless Tobacco Never     Social History   Socioeconomic History   Marital status: Married  Tobacco Use   Smoking status: Never   Smokeless tobacco: Never  Vaping Use   Vaping status: Never Used  Substance and Sexual Activity   Alcohol use: Yes   Drug use: Never   Social Drivers of Corporate investment banker Strain: Medium Risk (03/01/2023)   Received from [facility]   Overall Financial Resource Strain (CARDIA)    Difficulty of Paying Living Expenses: Somewhat hard  Food Insecurity: Food Insecurity  Present (03/01/2023)   Received from [facility]   Hunger Vital Sign    Worried About Running Out of Food in the Last Year: Sometimes true    Ran Out of Food in the Last Year: Sometimes true  Transportation Needs: No Transportation Needs (03/01/2023)   Received from [facility] - Transportation    Lack of Transportation (Medical): No    Lack of Transportation (Non-Medical): No  Physical Activity: Insufficiently Active (03/01/2023)   Received from [facility]   Exercise Vital Sign    Days of Exercise per Week: 3 days    Minutes of Exercise per Session: 30 min  Stress: Stress Concern Present (03/01/2023)   Received from [facility] - Occupational Stress Questionnaire    Feeling of Stress : Very much  Social Connections: Moderately Integrated (03/01/2023)   Received from [facility]   Social Connection and Isolation Panel [NHANES]    Frequency of Communication with Friends and Family: Three times a week    Frequency of Social Gatherings with Friends and Family: Once a week    Attends Religious Services: Never    Database administrator or Organizations: Yes    Attends Engineer, structural: More than 4 times per year    Marital Status: Married    Objective:    Vitals:   12/01/23 0931  PainSc:   4  PainLoc: Breast    There is no height or weight on file to calculate BMI.  Physical Exam   Constitutional:  WDWN in NAD, conversant, no obvious deformities; lying in bed comfortably Eyes:  Pupils equal, round; sclera anicteric; moist conjunctiva; no lid lag HENT:  Oral mucosa moist; good dentition  Neck:  No masses palpated, trachea midline; no thyromegaly Lungs:  CTA bilaterally; normal respiratory effort Breasts:  symmetric, Right upper outer central nipple - single opening with some clear serous drainage; no palpable masses or lymphadenopathy on either side; bilateral fibrocystic changes CV:  Regular rate and rhythm; no  murmurs; extremities well-perfused with no edema Abd:  +bowel sounds, soft, non-tender, no palpable organomegaly; no palpable hernias Musc:  Normal gait; no apparent clubbing or cyanosis in extremities Lymphatic:  No palpable cervical or axillary lymphadenopathy Skin:  Warm, dry; no sign of jaundice Psychiatric - alert and oriented x 4; calm mood and affect   Labs, Imaging and Diagnostic Testing: FINAL DIAGNOSIS        1. Breast, right, needle core biopsy,  :       FIBROCYSTIC CHANGES.       NEGATIVE FOR MALIGNANCY.       SEE NOTE.        Diagnosis Note : [facility] was notified on 09/22/2023.   CLINICAL DATA:  43 year old female presents with bloody and clear RIGHT nipple discharge for 1 month.  EXAM: DIGITAL DIAGNOSTIC UNILATERAL RIGHT MAMMOGRAM WITH TOMOSYNTHESIS AND CAD; ULTRASOUND RIGHT BREAST LIMITED   TECHNIQUE: Right digital diagnostic mammography and breast tomosynthesis was performed. The images were evaluated with computer-aided detection. ; Targeted ultrasound examination of the right breast was performed   COMPARISON:  Previous exam(s).   ACR Breast Density Category c: The breasts are heterogeneously dense, which may obscure small masses.   FINDINGS: Full field and spot compression views of the RIGHT breast demonstrate no suspicious mass, distortion or worrisome calcifications.   On physical exam, the patient was able to express single duct clear RIGHT nipple discharge.   Targeted ultrasound is performed, showing a 0.3 x 0.3 x 0.5 cm mass, likely intraductal, is noted at the base of the RIGHT nipple. No other RIGHT breast RETROAREOLAR abnormalities are noted.   No abnormal RIGHT axillary lymph nodes are identified.   IMPRESSION: 1. 0.5 cm mass at the base of the RIGHT nipple, likely intraductal, and likely the cause of this patient's nipple discharge. Tissue sampling is recommended. No abnormal appearing RIGHT axillary lymph nodes.    RECOMMENDATION: Ultrasound-guided RIGHT breast biopsy which will be scheduled.   I have discussed the findings and recommendations with the patient. If applicable, a reminder letter will be sent to the patient regarding the next appointment.   BI-RADS CATEGORY  4: Suspicious.     Electronically Signed   By: Harmon Pier M.D.   On: 09/20/2023 14:14  Assessment and Plan:  Diagnoses and all orders for this visit:  Bloody discharge from right nipple  Intraductal papilloma of breast, right    Recommend right breast radioactive seed localized excisional biopsy.The surgical procedure has been discussed with the patient.  Potential risks, benefits, alternative treatments, and expected outcomes have been explained.  All of the patient's questions at this time have been answered.  The likelihood of reaching the patient's treatment goal is good.  The patient understands the proposed surgical procedure and wishes to proceed.   Murice Barbar Delbert Harness, MD  12/01/2023 2:27 PM

## 2023-12-15 ENCOUNTER — Other Ambulatory Visit: Payer: Self-pay | Admitting: Surgery

## 2023-12-15 DIAGNOSIS — D241 Benign neoplasm of right breast: Secondary | ICD-10-CM

## 2023-12-27 ENCOUNTER — Ambulatory Visit: Payer: Self-pay

## 2023-12-27 NOTE — Telephone Encounter (Signed)
 Chief Complaint: Right thumb pain Symptoms: right thumb pain, swelling in finger tip, pain going up arm Frequency: since yesterday Pertinent Negatives: Patient denies warmth, redness, pus Disposition: [] ED /[] Urgent Care (no appt availability in office) / [] Appointment(In office/virtual)/ []  [place] [facility]/ [x] Home Care/ [] Refused Recommended Disposition /[] [facility]/ []  Follow-up with PCP Additional Notes: Patient called stating she is having pain in her right thumb since yesterday. Patient notices pain going up her arm. Patient states she is unsure if it has to do with extra nail/skin removed on Sunday. Patient denies s/s of infection including pus, redness, warmth. Patient states the nailbed is swollen. Patient advised to soak in epsom salt/water and keep area clean. Patient advised to call back if symptoms do not improve by end of day 3.    Copied from CRM [id number]. Topic: Clinical - Red Word Triage >> Dec 27, 2023  2:08 PM Wendy Parks wrote: Red Word that prompted transfer to Nurse Triage: Patient experiencing a possible ingrown thumb nail or something is causing her thumb to swell and and causing pain in her right thumb and arm. Reason for Disposition  Finger pain  Answer Assessment - Initial Assessment Questions 1. ONSET: "When did the pain start?"      Yesterday 2. LOCATION and RADIATION: "Where is the pain located?"  (e.g., fingertip, around nail, joint, entire  finger)      Right thumb, radiating up right arm up to elbow 3. SEVERITY: "How bad is the pain?" "What does it keep you from doing?"   (Scale 1-10; or mild, moderate, severe)  - MILD (1-3): doesn't interfere with normal activities.   - MODERATE (4-7): interferes with normal activities or awakens from sleep.  - SEVERE (8-10): excruciating pain, unable to hold a glass of water or bend finger even a little.     7 4. APPEARANCE: "What does the finger look like?" (e.g., redness, swelling, bruising, pallor)      Normal skin tone, painful and throbbing 5. WORK OR EXERCISE: "Has there been any recent work or exercise that involved this part (i.e., fingers or hand) of the body?"     Patient pulled side nail off/ingrown nail 6. CAUSE: "What do you think is causing the pain?"     Uncertain, patient pulled off side nail Sunday  7. AGGRAVATING FACTORS: "What makes the pain worse?" (e.g., using computer)     N/a 8. OTHER SYMPTOMS: "Do you have any other symptoms?" (e.g., fever, neck pain, numbness)     Swelling around nailbed and to first knuckle, mild warmth  Protocols used: Finger Pain-A-AH

## 2024-01-01 ENCOUNTER — Ambulatory Visit: Payer: Self-pay

## 2024-01-01 NOTE — Telephone Encounter (Signed)
  Chief Complaint: right thumb pain Symptoms: red,swollen, pus drainage in right thumb nail/finger Frequency: x 1 week Pertinent Negatives: Patient denies fevers, pain radiating up arm, numbness. Disposition: [] ED /[] Urgent Care (no appt availability in office) / [x] Appointment(In office/virtual)/ []  [facility]/ [] Home Care/ [] Refused Recommended Disposition /[] [facility]/ []  Follow-up with PCP Additional Notes: Patient called back, triaged last week and states symptoms did not improve and have worsened. Patient agreeable to acute visit tomorrow with PCP.  Copied from CRM [id number]. Topic: Clinical - Red Word Triage >> Jan 01, 2024 12:15 PM Leory Rands wrote: Red Word that prompted transfer to Nurse Triage:  Patient is calling to follow up- to NT call from 12/27/23- patient followed protocl finger is still in pain with white puss, and red, and still swollen. Reason for Disposition  Yellow pus under skin around fingernail (cuticle) or pus under fingernail  Answer Assessment - Initial Assessment Questions 1. ONSET: "When did the pain start?"      12/26/23.  2. LOCATION and RADIATION: "Where is the pain located?"  (e.g., fingertip, around nail, joint, entire  finger)      Right thumb.  3. SEVERITY: "How bad is the pain?" "What does it keep you from doing?"   (Scale 1-10; or mild, moderate, severe)  - MILD (1-3): doesn't interfere with normal activities.   - MODERATE (4-7): interferes with normal activities or awakens from sleep.  - SEVERE (8-10): excruciating pain, unable to hold a glass of water or bend finger even a little.     5/10.  4. APPEARANCE: "What does the finger look like?" (e.g., redness, swelling, bruising, pallor)     Red, swollen, white pus discharge.  5. WORK OR EXERCISE: "Has there been any recent work or exercise that involved this part (i.e., fingers or hand) of the body?"    Denies.  6. CAUSE: "What do you think is causing the pain?"     Patient  had a hangnail that she  was trying to clip.  7. AGGRAVATING FACTORS: "What makes the pain worse?" (e.g., using computer)     Bumping hand up against anything, closing hand in a fist.  8. OTHER SYMPTOMS: "Do you have any other symptoms?" (e.g., fever, neck pain, numbness)     Denies. 9. PREGNANCY: "Is there any chance you are pregnant?" "When was your last menstrual period?"     LMP:12/15/23.  Protocols used: Finger Pain-A-AH

## 2024-01-02 ENCOUNTER — Ambulatory Visit (INDEPENDENT_AMBULATORY_CARE_PROVIDER_SITE_OTHER): Payer: Self-pay | Admitting: Nurse Practitioner

## 2024-01-02 VITALS — BP 126/80 | HR 98 | Temp 98.5°F | Ht 65.0 in | Wt 227.2 lb

## 2024-01-02 DIAGNOSIS — E6609 Other obesity due to excess calories: Secondary | ICD-10-CM

## 2024-01-02 DIAGNOSIS — Z6837 Body mass index (BMI) 37.0-37.9, adult: Secondary | ICD-10-CM | POA: Diagnosis not present

## 2024-01-02 DIAGNOSIS — L03011 Cellulitis of right finger: Secondary | ICD-10-CM

## 2024-01-02 DIAGNOSIS — E66812 Obesity, class 2: Secondary | ICD-10-CM

## 2024-01-02 MED ORDER — CEPHALEXIN 500 MG PO CAPS
500.0000 mg | ORAL_CAPSULE | Freq: Four times a day (QID) | ORAL | 0 refills | Status: AC
Start: 2024-01-02 — End: 2024-01-12

## 2024-01-02 NOTE — Progress Notes (Signed)
 [place], CMA,acting as a Neurosurgeon for Susanna Epley, FNP.,have documented all relevant documentation on the behalf of Susanna Epley, FNP,as directed by  Susanna Epley, FNP while in the presence of Susanna Epley, FNP.  Subjective:  Patient ID: Wendy Parks , female    DOB: 1981/03/30 , 43 y.o.   MRN: [id number]  Chief Complaint  Patient presents with   Hand Pain    Patient presents today for a infected hang nail on her right thumb. Patient reports she has been dealing with it since last week she spoke with a triage nurse who gave her advise. Patient reports her finger now has puss, swollen, warm to the touch and painful.     HPI  She does not have manicures. She is having pain. She has been soaking in warm water with epsom salt.she has been applying topical ointment with a bandaid. She squeezed on Sunday had purulent drainage. She is scheduled for surgery on 5/28 for right breast mass.      Past Medical History:  Diagnosis Date   Headache    Migranes   Hypertension    Kidney stone      Family History  Problem Relation Age of Onset   Healthy Mother    Dementia Father    Cancer Father      Current Outpatient Medications:    AJOVY 225 MG/1.5ML SOAJ, Inject 1.5 mLs into the skin every 30 (thirty) days., Disp: , Rfl:    albuterol  (VENTOLIN  HFA) 108 (90 Base) MCG/ACT inhaler, TAKE 2 PUFFS BY MOUTH EVERY 6 HOURS AS NEEDED FOR WHEEZE OR SHORTNESS OF BREATH, Disp: 18 each, Rfl: 3   amLODipine  (NORVASC ) 2.5 MG tablet, TAKE 1 TABLET BY MOUTH AT BEDTIME., Disp: 90 tablet, Rfl: 2   baclofen (LIORESAL) 10 MG tablet, Take 10 mg by mouth 2 (two) times daily as needed., Disp: , Rfl:    BOTOX 100 units SOLR injection, Inject 100 Units into the muscle every 3 (three) months., Disp: , Rfl:    cephALEXin  (KEFLEX ) 500 MG capsule, Take 1 capsule (500 mg total) by mouth 4 (four) times daily for 10 days., Disp: 40 capsule, Rfl: 0   cholecalciferol (VITAMIN D3) 25 MCG (1000 UNIT) tablet, Take 1,000  Units by mouth daily., Disp: , Rfl:    citalopram  (CELEXA ) 10 MG tablet, TAKE 1 TABLET BY MOUTH EVERY DAY, Disp: 90 tablet, Rfl: 1   rizatriptan (MAXALT) 10 MG tablet, Take 5 mg by mouth as needed for migraine., Disp: , Rfl:    No Known Allergies   Review of Systems  Constitutional: Negative.  Negative for chills and fever.  Respiratory: Negative.    Cardiovascular: Negative.   Musculoskeletal:        Finger pain   Neurological: Negative.      Today's Vitals   01/02/24 1532  BP: 126/80  Pulse: 98  Temp: 98.5 F (36.9 C)  TempSrc: Oral  Weight: 227 lb 3.2 oz (103.1 kg)  Height: 5\' 5"  (1.651 m)  PainSc: 2   PainLoc: Finger   Body mass index is 37.81 kg/m.  Wt Readings from Last 3 Encounters:  01/02/24 227 lb 3.2 oz (103.1 kg)  09/04/23 221 lb 6.4 oz (100.4 kg)  08/08/23 216 lb 3.2 oz (98.1 kg)     Objective:  Physical Exam Vitals and nursing note reviewed.  Constitutional:      Appearance: Normal appearance.  Cardiovascular:     Pulses: Normal pulses.     Heart sounds: Normal heart  sounds. No murmur heard. Pulmonary:     Effort: Pulmonary effort is normal. No respiratory distress.  Skin:    Capillary Refill: Capillary refill takes less than 2 seconds.     Comments: Right thumb medial aspect at cuticle with tenderness, no drainage at this time the area is mostly soft.   Neurological:     General: No focal deficit present.     Mental Status: She is alert and oriented to person, place, and time.     Cranial Nerves: No cranial nerve deficit.     Motor: No weakness.  Psychiatric:        Mood and Affect: Mood normal.        Behavior: Behavior normal.        Thought Content: Thought content normal.        Judgment: Judgment normal.         Assessment And Plan:  Paronychia of finger, right Assessment & Plan: Right thumb at medial cuticle space with mostly soft and no drainage. Will treat for infection especially since she has an upcoming surgery, advised if not  better return call to office.   Orders: -     Cephalexin ; Take 1 capsule (500 mg total) by mouth 4 (four) times daily for 10 days.  Dispense: 40 capsule; Refill: 0  Class 2 obesity due to excess calories with body mass index (BMI) of 37.0 to 37.9 in adult, unspecified whether serious comorbidity present Assessment & Plan: She is encouraged to strive for BMI less than 30 to decrease cardiac risk. Advised to aim for at least 150 minutes of exercise per week.      Return for keep same next.  Patient was given opportunity to ask questions. Patient verbalized understanding of the plan and was able to repeat key elements of the plan. All questions were answered to their satisfaction.    Inge Mangle, FNP, have reviewed all documentation for this visit. The documentation on 01/02/24 for the exam, diagnosis, procedures, and orders are all accurate and complete.   IF YOU HAVE BEEN REFERRED TO A SPECIALIST, IT MAY TAKE 1-2 WEEKS TO SCHEDULE/PROCESS THE REFERRAL. IF YOU HAVE NOT HEARD FROM US /SPECIALIST IN TWO WEEKS, PLEASE GIVE US  A CALL AT [phone].

## 2024-01-08 ENCOUNTER — Encounter: Payer: Self-pay | Admitting: Internal Medicine

## 2024-01-09 ENCOUNTER — Encounter (HOSPITAL_BASED_OUTPATIENT_CLINIC_OR_DEPARTMENT_OTHER): Payer: Self-pay | Admitting: Surgery

## 2024-01-09 ENCOUNTER — Other Ambulatory Visit: Payer: Self-pay

## 2024-01-09 DIAGNOSIS — E66812 Obesity, class 2: Secondary | ICD-10-CM | POA: Insufficient documentation

## 2024-01-09 DIAGNOSIS — L03011 Cellulitis of right finger: Secondary | ICD-10-CM | POA: Insufficient documentation

## 2024-01-09 NOTE — Assessment & Plan Note (Signed)
 She is encouraged to strive for BMI less than 30 to decrease cardiac risk. Advised to aim for at least 150 minutes of exercise per week.

## 2024-01-09 NOTE — Assessment & Plan Note (Addendum)
 Right thumb at medial cuticle space with mostly soft and no drainage. Will treat for infection especially since she has an upcoming surgery, advised if not better return call to office.

## 2024-01-16 ENCOUNTER — Ambulatory Visit
Admission: RE | Admit: 2024-01-16 | Discharge: 2024-01-16 | Disposition: A | Discharge status: Home / Self Care | Source: Ambulatory Visit | Attending: Surgery | Admitting: Surgery

## 2024-01-16 DIAGNOSIS — D241 Benign neoplasm of right breast: Secondary | ICD-10-CM

## 2024-01-16 HISTORY — PX: BREAST BIOPSY: SHX20

## 2024-01-16 NOTE — Progress Notes (Signed)
 Patient was provided with CHG cleanser to use at home before the procedure. Patient verbalized understanding of instructions.Patient was provided with CHG cleanser to use at home before the procedure. Patient verbalized understanding of instructions.     Enhanced Recovery after Surgery Enhanced Recovery after Surgery is a protocol used to improve the stress on your body and your recovery after surgery.  Patient Instructions  The night before surgery:  No food after midnight. ONLY clear liquids after midnight  The day of surgery (if you do NOT have diabetes):  Drink ONE (1) Pre-Surgery Clear Ensure as directed.   This drink was given to you during your hospital  pre-op appointment visit. The pre-op nurse will instruct you on the time to drink the  Pre-Surgery Ensure depending on your surgery time. Finish the drink at the designated time by the pre-op nurse.  Nothing else to drink after completing the  Pre-Surgery Clear Ensure.  The day of surgery (if you have diabetes): Drink ONE (1) Gatorade 2 (G2) as directed. This drink was given to you during your hospital  pre-op appointment visit.  The pre-op nurse will instruct you on the time to drink the   Gatorade 2 (G2) depending on your surgery time. Color of the Gatorade may vary. Red is not allowed. Nothing else to drink after completing the  Gatorade 2 (G2).         If you have questions, please contact your surgeon's office.

## 2024-01-16 NOTE — H&P (Signed)
 Subjective    Chief Complaint: Follow-up (Rt intraductal papilloma)       History of Present Illness: Wendy Parks is a 43 y.o. female who is seen today as an office consultation at the request of Dr. Eli Grizzle for evaluation of Follow-up (Rt intraductal papilloma) .   This is a 43 year old female in good health who presents with a one month history of intermittent right nipple discharge.  Initially, this appeared bloody, but it is now clear serous fluid.  No history of trauma.  No previous breast surgery.  No family history of breast cancer.  Mammogram and US  showed a 5 mm mass in the retroareolar space.  Biopsy showed only fibrocystic changes.  She continues to have nipple discharge.   After her initial visit, we obtained an MRI of her breast.  This resulted in 2 additional MRI guided biopsies.  In the retroareolar space, there is a 2 cm anterior to posterior area of abnormal enhancement.  This was biopsied and revealed intraductal papilloma.  Another biopsy revealed a fibroadenoma in the right breast at 9:00.  She has a separate fibroadenoma in the lower inner right quadrant.  Both of these fibroadenomas are relatively small.   She presents now to discuss the ongoing nipple discharge and the retroareolar intraductal papilloma.     Review of Systems: A complete review of systems was obtained from the patient.  I have reviewed this information and discussed as appropriate with the patient.  See HPI as well for other ROS.   Review of Systems  Constitutional: Negative.   HENT: Negative.    Eyes: Negative.   Respiratory: Negative.    Cardiovascular: Negative.   Gastrointestinal: Negative.   Genitourinary: Negative.   Musculoskeletal: Negative.   Skin: Negative.   Neurological: Negative.   Endo/Heme/Allergies: Negative.   Psychiatric/Behavioral: Negative.          Medical History:     Past Medical History:  Diagnosis Date   GERD (gastroesophageal reflux disease)     Hypertension            Patient Active Problem List  Diagnosis   Bloody discharge from right nipple   Adjustment disorder with anxious mood   Amenorrhea   Chronic migraine with aura without status migrainosus, not intractable   Abnormal thyroid  blood test   Class 2 severe obesity due to excess calories with serious comorbidity and body mass index (BMI) of 36.0 to 36.9 in adult (CMS/HHS-HCC)   Fibroadenoma of right breast   Herpes simplex   History of hypertension      History reviewed. No pertinent surgical history.    No Known Allergies         Current Outpatient Medications on File Prior to Visit  Medication Sig Dispense Refill   AJOVY AUTOINJECTOR 225 mg/1.5 mL AtIn Inject 1.5 mLs subcutaneously       albuterol  MDI, PROVENTIL , VENTOLIN , PROAIR , HFA 90 mcg/actuation inhaler Inhale 2 Inhalations into the lungs every 6 (six) hours as needed       amLODIPine  (NORVASC ) 2.5 MG tablet Take 1 tablet by mouth at bedtime       baclofen (LIORESAL) 10 MG tablet Take 1 tablet 4 times a day by oral route.       citalopram  (CELEXA ) 10 MG tablet Take 1 tablet by mouth once daily       onabotulinumtoxinA (BOTOX) 100 unit injection Inject 100 Units into the muscle       rizatriptan (MAXALT) 10 MG tablet  Take 5 mg by mouth        No current facility-administered medications on file prior to visit.           Family History  Problem Relation Age of Onset   Other (oral cancer) Father        Social History       Tobacco Use  Smoking Status Never  Smokeless Tobacco Never      Social History        Socioeconomic History   Marital status: Married  Tobacco Use   Smoking status: Never   Smokeless tobacco: Never  Vaping Use   Vaping status: Never Used  Substance and Sexual Activity   Alcohol use: Yes   Drug use: Never    Social Drivers of Acupuncturist Strain: Medium Risk (03/01/2023)    Received from [facility]    Overall Financial Resource Strain (CARDIA)      Difficulty of Paying Living Expenses: Somewhat hard  Food Insecurity: Food Insecurity Present (03/01/2023)    Received from [facility]    Hunger Vital Sign     Worried About Running Out of Food in the Last Year: Sometimes true     Ran Out of Food in the Last Year: Sometimes true  Transportation Needs: No Transportation Needs (03/01/2023)    Received from [facility] - Transportation     Lack of Transportation (Medical): No     Lack of Transportation (Non-Medical): No  Physical Activity: Insufficiently Active (03/01/2023)    Received from [facility]    Exercise Vital Sign     Days of Exercise per Week: 3 days     Minutes of Exercise per Session: 30 min  Stress: Stress Concern Present (03/01/2023)    Received from [facility] - Occupational Stress Questionnaire     Feeling of Stress : Very much  Social Connections: Moderately Integrated (03/01/2023)    Received from [facility]    Social Connection and Isolation Panel [NHANES]     Frequency of Communication with Friends and Family: Three times a week     Frequency of Social Gatherings with Friends and Family: Once a week     Attends Religious Services: Never     Database administrator or Organizations: Yes     Attends Engineer, structural: More than 4 times per year     Marital Status: Married      Objective:         Vitals:    12/01/23 0931  PainSc:   4  PainLoc: Breast    There is no height or weight on file to calculate BMI.   Physical Exam    Constitutional:  WDWN in NAD, conversant, no obvious deformities; lying in bed comfortably Eyes:  Pupils equal, round; sclera anicteric; moist conjunctiva; no lid lag HENT:  Oral mucosa moist; good dentition  Neck:  No masses palpated, trachea midline; no thyromegaly Lungs:  CTA bilaterally; normal respiratory effort Breasts:  symmetric, Right upper outer central nipple - single opening with some clear serous  drainage; no palpable masses or lymphadenopathy on either side; bilateral fibrocystic changes CV:  Regular rate and rhythm; no murmurs; extremities well-perfused with no edema Abd:  +bowel sounds, soft, non-tender, no palpable organomegaly; no palpable hernias Musc:  Normal gait; no apparent clubbing or cyanosis in extremities Lymphatic:  No palpable cervical or  axillary lymphadenopathy Skin:  Warm, dry; no sign of jaundice Psychiatric - alert and oriented x 4; calm mood and affect     Labs, Imaging and Diagnostic Testing: FINAL DIAGNOSIS        1. Breast, right, needle core biopsy,  :       FIBROCYSTIC CHANGES.       NEGATIVE FOR MALIGNANCY.       SEE NOTE.        Diagnosis Note : [facility] was notified on 09/22/2023.    CLINICAL DATA:  44 year old female presents with bloody and clear RIGHT nipple discharge for 1 month.   EXAM: DIGITAL DIAGNOSTIC UNILATERAL RIGHT MAMMOGRAM WITH TOMOSYNTHESIS AND CAD; ULTRASOUND RIGHT BREAST LIMITED   TECHNIQUE: Right digital diagnostic mammography and breast tomosynthesis was performed. The images were evaluated with computer-aided detection. ; Targeted ultrasound examination of the right breast was performed   COMPARISON:  Previous exam(s).   ACR Breast Density Category c: The breasts are heterogeneously dense, which may obscure small masses.   FINDINGS: Full field and spot compression views of the RIGHT breast demonstrate no suspicious mass, distortion or worrisome calcifications.   On physical exam, the patient was able to express single duct clear RIGHT nipple discharge.   Targeted ultrasound is performed, showing a 0.3 x 0.3 x 0.5 cm mass, likely intraductal, is noted at the base of the RIGHT nipple. No other RIGHT breast RETROAREOLAR abnormalities are noted.   No abnormal RIGHT axillary lymph nodes are identified.   IMPRESSION: 1. 0.5 cm mass at the base of the RIGHT nipple, likely intraductal, and likely  the cause of this patient's nipple discharge. Tissue sampling is recommended. No abnormal appearing RIGHT axillary lymph nodes.   RECOMMENDATION: Ultrasound-guided RIGHT breast biopsy which will be scheduled.   I have discussed the findings and recommendations with the patient. If applicable, a reminder letter will be sent to the patient regarding the next appointment.   BI-RADS CATEGORY  4: Suspicious.     Electronically Signed   By: Sundra Engel M.D.   On: 09/20/2023 14:14   Assessment and Plan:  Diagnoses and all orders for this visit:   Bloody discharge from right nipple   Intraductal papilloma of breast, right     Recommend right breast radioactive seed localized excisional biopsy.The surgical procedure has been discussed with the patient.  Potential risks, benefits, alternative treatments, and expected outcomes have been explained.  All of the patient's questions at this time have been answered.  The likelihood of reaching the patient's treatment goal is good.  The patient understands the proposed surgical procedure and wishes to proceed.  Kari Otto. Eli Grizzle, MD, [facility]  General Surgery   01/16/2024 11:14 AM

## 2024-01-17 ENCOUNTER — Other Ambulatory Visit: Payer: Self-pay

## 2024-01-17 ENCOUNTER — Ambulatory Visit (HOSPITAL_BASED_OUTPATIENT_CLINIC_OR_DEPARTMENT_OTHER): Admitting: Anesthesiology

## 2024-01-17 ENCOUNTER — Encounter (HOSPITAL_BASED_OUTPATIENT_CLINIC_OR_DEPARTMENT_OTHER)
Admission: RE | Disposition: A | Discharge status: Home / Self Care | Payer: Self-pay | Source: Home / Self Care | Attending: Surgery

## 2024-01-17 ENCOUNTER — Ambulatory Visit (HOSPITAL_BASED_OUTPATIENT_CLINIC_OR_DEPARTMENT_OTHER)
Admission: RE | Admit: 2024-01-17 | Discharge: 2024-01-17 | Disposition: A | Discharge status: Home / Self Care | Attending: Surgery | Admitting: Surgery

## 2024-01-17 ENCOUNTER — Ambulatory Visit
Admission: RE | Admit: 2024-01-17 | Discharge: 2024-01-17 | Disposition: A | Discharge status: Home / Self Care | Source: Ambulatory Visit | Attending: Surgery | Admitting: Surgery

## 2024-01-17 ENCOUNTER — Encounter (HOSPITAL_BASED_OUTPATIENT_CLINIC_OR_DEPARTMENT_OTHER): Payer: Self-pay | Admitting: Surgery

## 2024-01-17 DIAGNOSIS — Z7951 Long term (current) use of inhaled steroids: Secondary | ICD-10-CM | POA: Insufficient documentation

## 2024-01-17 DIAGNOSIS — N6452 Nipple discharge: Secondary | ICD-10-CM | POA: Diagnosis not present

## 2024-01-17 DIAGNOSIS — D241 Benign neoplasm of right breast: Secondary | ICD-10-CM | POA: Diagnosis not present

## 2024-01-17 DIAGNOSIS — I10 Essential (primary) hypertension: Secondary | ICD-10-CM | POA: Insufficient documentation

## 2024-01-17 DIAGNOSIS — E66812 Obesity, class 2: Secondary | ICD-10-CM | POA: Insufficient documentation

## 2024-01-17 DIAGNOSIS — N6021 Fibroadenosis of right breast: Secondary | ICD-10-CM | POA: Insufficient documentation

## 2024-01-17 DIAGNOSIS — Z79899 Other long term (current) drug therapy: Secondary | ICD-10-CM | POA: Diagnosis not present

## 2024-01-17 DIAGNOSIS — N912 Amenorrhea, unspecified: Secondary | ICD-10-CM | POA: Diagnosis not present

## 2024-01-17 DIAGNOSIS — N6011 Diffuse cystic mastopathy of right breast: Secondary | ICD-10-CM | POA: Diagnosis not present

## 2024-01-17 DIAGNOSIS — Z6837 Body mass index (BMI) 37.0-37.9, adult: Secondary | ICD-10-CM | POA: Diagnosis not present

## 2024-01-17 DIAGNOSIS — G43E09 Chronic migraine with aura, not intractable, without status migrainosus: Secondary | ICD-10-CM | POA: Diagnosis not present

## 2024-01-17 DIAGNOSIS — K219 Gastro-esophageal reflux disease without esophagitis: Secondary | ICD-10-CM | POA: Diagnosis not present

## 2024-01-17 DIAGNOSIS — Z01818 Encounter for other preprocedural examination: Secondary | ICD-10-CM

## 2024-01-17 HISTORY — PX: RADIOACTIVE SEED GUIDED EXCISIONAL BREAST BIOPSY: SHX6490

## 2024-01-17 HISTORY — PX: RADIOACTIVE SEED GUIDED AXILLARY SENTINEL LYMPH NODE: SHX6735

## 2024-01-17 LAB — POCT PREGNANCY, URINE: Preg Test, Ur: NEGATIVE

## 2024-01-17 SURGERY — RADIOACTIVE SEED GUIDED AXILLARY SENTINEL LYMPH NODE BIOPSY
Anesthesia: General | Site: Breast | Laterality: Right

## 2024-01-17 MED ORDER — FENTANYL CITRATE (PF) 100 MCG/2ML IJ SOLN
INTRAMUSCULAR | Status: AC
  Filled 2024-01-17: qty 2

## 2024-01-17 MED ORDER — LACTATED RINGERS IV SOLN: INTRAVENOUS | Status: DC

## 2024-01-17 MED ORDER — PROPOFOL 10 MG/ML IV BOLUS
INTRAVENOUS | Status: AC
  Filled 2024-01-17: qty 20

## 2024-01-17 MED ORDER — ACETAMINOPHEN 10 MG/ML IV SOLN: 1000.0000 mg | Freq: Once | INTRAVENOUS | Status: DC | PRN

## 2024-01-17 MED ORDER — ONDANSETRON HCL 4 MG/2ML IJ SOLN
INTRAMUSCULAR | Status: DC | PRN
  Administered 2024-01-17: 4 mg via INTRAVENOUS

## 2024-01-17 MED ORDER — ONDANSETRON HCL 4 MG/2ML IJ SOLN
INTRAMUSCULAR | Status: AC
  Filled 2024-01-17: qty 2

## 2024-01-17 MED ORDER — 0.9 % SODIUM CHLORIDE (POUR BTL) OPTIME
TOPICAL | Status: DC | PRN
  Administered 2024-01-17: 1000 mL

## 2024-01-17 MED ORDER — OXYCODONE HCL 5 MG PO TABS: 5.0000 mg | ORAL_TABLET | Freq: Once | ORAL | Status: DC | PRN

## 2024-01-17 MED ORDER — CHLORHEXIDINE GLUCONATE CLOTH 2 % EX PADS: 6.0000 | MEDICATED_PAD | Freq: Once | CUTANEOUS | Status: DC

## 2024-01-17 MED ORDER — DROPERIDOL 2.5 MG/ML IJ SOLN
0.6250 mg | Freq: Once | INTRAMUSCULAR | Status: DC | PRN
Start: 2024-01-17 — End: 2024-01-17

## 2024-01-17 MED ORDER — DEXAMETHASONE SODIUM PHOSPHATE 4 MG/ML IJ SOLN
INTRAMUSCULAR | Status: DC | PRN
  Administered 2024-01-17: 10 mg via INTRAVENOUS

## 2024-01-17 MED ORDER — CEFAZOLIN SODIUM-DEXTROSE 2-4 GM/100ML-% IV SOLN
INTRAVENOUS | Status: AC
  Filled 2024-01-17: qty 100

## 2024-01-17 MED ORDER — MIDAZOLAM HCL 5 MG/5ML IJ SOLN
INTRAMUSCULAR | Status: DC | PRN
  Administered 2024-01-17: 2 mg via INTRAVENOUS

## 2024-01-17 MED ORDER — LIDOCAINE 2% (20 MG/ML) 5 ML SYRINGE
INTRAMUSCULAR | Status: AC
  Filled 2024-01-17: qty 5

## 2024-01-17 MED ORDER — SCOPOLAMINE 1 MG/3DAYS TD PT72: 1.0000 | MEDICATED_PATCH | TRANSDERMAL | Status: DC

## 2024-01-17 MED ORDER — ACETAMINOPHEN 500 MG PO TABS
ORAL_TABLET | ORAL | Status: AC
  Filled 2024-01-17: qty 2

## 2024-01-17 MED ORDER — BUPIVACAINE-EPINEPHRINE (PF) 0.25% -1:200000 IJ SOLN
INTRAMUSCULAR | Status: AC
  Filled 2024-01-17: qty 30

## 2024-01-17 MED ORDER — FENTANYL CITRATE (PF) 100 MCG/2ML IJ SOLN
INTRAMUSCULAR | Status: AC
Start: 2024-01-17 — End: ?
  Filled 2024-01-17: qty 2

## 2024-01-17 MED ORDER — SCOPOLAMINE 1 MG/3DAYS TD PT72
MEDICATED_PATCH | TRANSDERMAL | Status: AC
  Filled 2024-01-17: qty 1

## 2024-01-17 MED ORDER — OXYCODONE HCL 5 MG/5ML PO SOLN: 5.0000 mg | Freq: Once | ORAL | Status: DC | PRN

## 2024-01-17 MED ORDER — PROPOFOL 10 MG/ML IV BOLUS
INTRAVENOUS | Status: DC | PRN
Start: 2024-01-17 — End: 2024-01-17
  Administered 2024-01-17: 200 mg via INTRAVENOUS
  Administered 2024-01-17: 40 mg via INTRAVENOUS

## 2024-01-17 MED ORDER — CEFAZOLIN SODIUM-DEXTROSE 2-4 GM/100ML-% IV SOLN
2.0000 g | INTRAVENOUS | Status: AC
  Administered 2024-01-17: 2 g via INTRAVENOUS

## 2024-01-17 MED ORDER — FENTANYL CITRATE (PF) 100 MCG/2ML IJ SOLN
25.0000 ug | INTRAMUSCULAR | Status: DC | PRN
  Administered 2024-01-17 (×2): 50 ug via INTRAVENOUS

## 2024-01-17 MED ORDER — ACETAMINOPHEN 500 MG PO TABS
1000.0000 mg | ORAL_TABLET | ORAL | Status: AC
  Administered 2024-01-17: 1000 mg via ORAL

## 2024-01-17 MED ORDER — LIDOCAINE 2% (20 MG/ML) 5 ML SYRINGE
INTRAMUSCULAR | Status: DC | PRN
  Administered 2024-01-17: 60 mg via INTRAVENOUS

## 2024-01-17 MED ORDER — FENTANYL CITRATE (PF) 100 MCG/2ML IJ SOLN
INTRAMUSCULAR | Status: DC | PRN
  Administered 2024-01-17 (×2): 25 ug via INTRAVENOUS
  Administered 2024-01-17: 50 ug via INTRAVENOUS

## 2024-01-17 MED ORDER — MIDAZOLAM HCL 2 MG/2ML IJ SOLN
INTRAMUSCULAR | Status: AC
  Filled 2024-01-17: qty 2

## 2024-01-17 MED ORDER — BUPIVACAINE-EPINEPHRINE 0.25% -1:200000 IJ SOLN
INTRAMUSCULAR | Status: DC | PRN
  Administered 2024-01-17: 10 mL

## 2024-01-17 MED ORDER — DEXAMETHASONE SODIUM PHOSPHATE 10 MG/ML IJ SOLN
INTRAMUSCULAR | Status: AC
  Filled 2024-01-17: qty 1

## 2024-01-17 MED ORDER — DEXMEDETOMIDINE HCL IN NACL 80 MCG/20ML IV SOLN
INTRAVENOUS | Status: DC | PRN
  Administered 2024-01-17: 4 ug via INTRAVENOUS

## 2024-01-17 SURGICAL SUPPLY — 36 items
BENZOIN TINCTURE PRP APPL 2/3 (GAUZE/BANDAGES/DRESSINGS) ×1 IMPLANT
BLADE HEX COATED 2.75 (ELECTRODE) ×1 IMPLANT
BLADE SURG 15 STRL LF DISP TIS (BLADE) ×1 IMPLANT
CANISTER SUCT 1200ML W/VALVE (MISCELLANEOUS) ×1 IMPLANT
CHLORAPREP W/TINT 26 (MISCELLANEOUS) ×1 IMPLANT
CLIP APPLIE 9.375 MED OPEN (MISCELLANEOUS) IMPLANT
COVER BACK TABLE 60X90IN (DRAPES) ×1 IMPLANT
COVER MAYO STAND STRL (DRAPES) ×1 IMPLANT
COVER PROBE CYLINDRICAL 5X96 (MISCELLANEOUS) ×1 IMPLANT
DRAPE LAPAROTOMY 100X72 PEDS (DRAPES) ×1 IMPLANT
DRAPE UTILITY XL STRL (DRAPES) ×1 IMPLANT
DRSG TEGADERM 4X4.75 (GAUZE/BANDAGES/DRESSINGS) ×1 IMPLANT
ELECTRODE REM PT RTRN 9FT ADLT (ELECTROSURGICAL) ×1 IMPLANT
GAUZE SPONGE 2X2 STRL 8-PLY (GAUZE/BANDAGES/DRESSINGS) IMPLANT
GAUZE SPONGE 4X4 12PLY STRL LF (GAUZE/BANDAGES/DRESSINGS) ×1 IMPLANT
GLOVE BIO SURGEON STRL SZ7 (GLOVE) ×1 IMPLANT
GLOVE BIOGEL PI IND STRL 7.5 (GLOVE) ×1 IMPLANT
GOWN STRL REUS W/ TWL LRG LVL3 (GOWN DISPOSABLE) ×2 IMPLANT
KIT MARKER MARGIN INK (KITS) ×1 IMPLANT
NDL HYPO 25X1 1.5 SAFETY (NEEDLE) ×1 IMPLANT
NEEDLE HYPO 25X1 1.5 SAFETY (NEEDLE) ×1 IMPLANT
NS IRRIG 1000ML POUR BTL (IV SOLUTION) ×1 IMPLANT
PACK BASIN DAY SURGERY FS (CUSTOM PROCEDURE TRAY) ×1 IMPLANT
PENCIL SMOKE EVACUATOR (MISCELLANEOUS) ×1 IMPLANT
SLEEVE SCD COMPRESS KNEE MED (STOCKING) ×1 IMPLANT
SPIKE FLUID TRANSFER (MISCELLANEOUS) IMPLANT
SPONGE T-LAP 4X18 ~~LOC~~+RFID (SPONGE) ×1 IMPLANT
STRIP CLOSURE SKIN 1/2X4 (GAUZE/BANDAGES/DRESSINGS) ×1 IMPLANT
SUT MON AB 4-0 PC3 18 (SUTURE) ×1 IMPLANT
SUT SILK 2 0 SH (SUTURE) IMPLANT
SUT VIC AB 3-0 SH 27X BRD (SUTURE) ×1 IMPLANT
SYR CONTROL 10ML LL (SYRINGE) ×1 IMPLANT
TOWEL GREEN STERILE FF (TOWEL DISPOSABLE) ×1 IMPLANT
TRAY FAXITRON CT DISP (TRAY / TRAY PROCEDURE) ×1 IMPLANT
TUBE CONNECTING 20X1/4 (TUBING) ×1 IMPLANT
YANKAUER SUCT BULB TIP NO VENT (SUCTIONS) ×1 IMPLANT

## 2024-01-17 NOTE — Op Note (Signed)
 Pre-op Diagnosis:  Right nipple discharge/ right breast intraductal papilloma Post-op Diagnosis: same Procedure:  Right breast radioactive seed localized excisional biopsy Surgeon:  Laynee Lockamy K. Assistant:  Puja Maczis, PA-C Anesthesia:  GEN - LMA Indications:  This is a 43 year old female in good health who presents with a one month history of intermittent right nipple discharge.  Initially, this appeared bloody, but it is now clear serous fluid.  No history of trauma.  No previous breast surgery.  No family history of breast cancer.  Mammogram and US  showed a 5 mm mass in the retroareolar space.  Biopsy showed only fibrocystic changes.  She continues to have nipple discharge.   After her initial visit, we obtained an MRI of her breast.  This resulted in 2 additional MRI guided biopsies.  In the retroareolar space, there is a 2 cm anterior to posterior area of abnormal enhancement.  This was biopsied and revealed intraductal papilloma.  Another biopsy revealed a fibroadenoma in the right breast at 9:00.  She has a separate fibroadenoma in the lower inner right quadrant.  Both of these fibroadenomas are relatively small.   She presents now for excision the retroareolar intraductal papilloma.  A radioactive seed was placed yesterday by Radiology.   Description of procedure: The patient is brought to the operating room placed in supine position on the operating room table. After an adequate level of general anesthesia was obtained, her right breast was prepped with ChloraPrep and draped in sterile fashion. A timeout was taken to ensure the proper patient and proper procedure. We interrogated the breast with the neoprobe. We made a circumareolar incision around the lower side of the nipple after infiltrating with 0.25% Marcaine. Dissection was carried up behind the nipple with cautery. We used the neoprobe to guide us  towards the radioactive seed. We excised an area of tissue around the radioactive seed  2.5 cm in diameter. The specimen was removed and was oriented with a paint kit. Specimen mammogram showed the radioactive seed as well as the biopsy clip within the specimen. This was sent for pathologic examination. There is no residual radioactivity within the biopsy cavity. We inspected carefully for hemostasis. The wound was thoroughly irrigated. The wound was closed with a deep layer of 3-0 Vicryl and a subcuticular layer of 4-0 Monocryl. Benzoin Steri-Strips were applied. The patient was then extubated and brought to the recovery room in stable condition. All sponge, instrument, and needle counts are correct.  Kari Otto. Eli Grizzle, MD, [facility]  General/ Trauma Surgery  01/17/2024 9:06 AM

## 2024-01-17 NOTE — Anesthesia Preprocedure Evaluation (Signed)
 Anesthesia Evaluation  Patient identified by MRN, date of birth, ID band Patient awake    Reviewed: Allergy & Precautions, NPO status , Patient's Chart, lab work & pertinent test results  Airway Mallampati: II       Dental no notable dental hx.    Pulmonary neg pulmonary ROS   Pulmonary exam normal        Cardiovascular hypertension, Pt. on medications Normal cardiovascular exam     Neuro/Psych  Headaches PSYCHIATRIC DISORDERS         GI/Hepatic Neg liver ROS,GERD  ,,  Endo/Other  negative endocrine ROS    Renal/GU Renal disease     Musculoskeletal negative musculoskeletal ROS (+)    Abdominal   Peds  Hematology negative hematology ROS (+)   Anesthesia Other Findings   Reproductive/Obstetrics                             Anesthesia Physical Anesthesia Plan  ASA: 2  Anesthesia Plan: General   Post-op Pain Management: Tylenol  PO (pre-op)* and Toradol  IV (intra-op)*   Induction: Intravenous  PONV Risk Score and Plan: 4 or greater and Ondansetron , Dexamethasone , Midazolam and Scopolamine patch - Pre-op  Airway Management Planned: LMA  Additional Equipment: None  Intra-op Plan:   Post-operative Plan: Extubation in OR  Informed Consent: I have reviewed the patients History and Physical, chart, labs and discussed the procedure including the risks, benefits and alternatives for the proposed anesthesia with the patient or authorized representative who has indicated his/her understanding and acceptance.     Dental advisory given  Plan Discussed with: CRNA  Anesthesia Plan Comments:        Anesthesia Quick Evaluation

## 2024-01-17 NOTE — Interval H&P Note (Signed)
 History and Physical Interval Note:  01/17/2024 8:11 AM  Wendy Parks  has presented today for surgery, with the diagnosis of RIGHT INTRADUCTAL PAPILLOMA.  The various methods of treatment have been discussed with the patient and family. After consideration of risks, benefits and other options for treatment, the patient has consented to  Procedure(s) with comments: EXCISION, MASS, BREAST, USING RADIOLOGICAL MARKER (Right) - LMA RIGHT BREAST RADIOACTIVE SEED LOCALIZED EXCISIONAL BIOPSY as a surgical intervention.  The patient's history has been reviewed, patient examined, no change in status, stable for surgery.  I have reviewed the patient's chart and labs.  Questions were answered to the patient's satisfaction.     Rella Cardinal

## 2024-01-17 NOTE — Discharge Instructions (Addendum)
 [facility] Office Phone Number [phone]  BREAST BIOPSY/ PARTIAL MASTECTOMY: POST OP INSTRUCTIONS  Always review your discharge instruction sheet given to you by the facility where your surgery was performed.  IF YOU HAVE DISABILITY OR FAMILY LEAVE FORMS, YOU MUST BRING THEM TO THE OFFICE FOR PROCESSING.  DO NOT GIVE THEM TO YOUR DOCTOR.  A prescription for pain medication may be given to you upon discharge.  Take your pain medication as prescribed, if needed.  If narcotic pain medicine is not needed, then you may take acetaminophen  (Tylenol ) or ibuprofen  (Advil ) as needed. Take your usually prescribed medications unless otherwise directed If you need a refill on your pain medication, please contact your pharmacy.  They will contact our office to request authorization.  Prescriptions will not be filled after 5pm or on week-ends. You should eat very light the first 24 hours after surgery, such as soup, crackers, pudding, etc.  Resume your normal diet the day after surgery. Most patients will experience some swelling and bruising in the breast.  Ice packs and a good support bra will help.  Swelling and bruising can take several days to resolve.  It is common to experience some constipation if taking pain medication after surgery.  Increasing fluid intake and taking a stool softener will usually help or prevent this problem from occurring.  A mild laxative (Milk of Magnesia or Miralax) should be taken according to package directions if there are no bowel movements after 48 hours. Unless discharge instructions indicate otherwise, you may remove your bandages 24-48 hours after surgery, and you may shower at that time.  You may have steri-strips (small skin tapes) in place directly over the incision.  These strips should be left on the skin for 7-10 days.  If your surgeon used skin glue on the incision, you may shower in 24 hours.  The glue will flake off over the next 2-3 weeks.  Any  sutures or staples will be removed at the office during your follow-up visit. ACTIVITIES:  You may resume regular daily activities (gradually increasing) beginning the next day.  Wearing a good support bra or sports bra minimizes pain and swelling.  You may have sexual intercourse when it is comfortable. You may drive when you no longer are taking prescription pain medication, you can comfortably wear a seatbelt, and you can safely maneuver your car and apply brakes. RETURN TO WORK:  ______________________________________________________________________________________ Wendy Parks should see your doctor in the office for a follow-up appointment approximately two weeks after your surgery.  Your doctor's nurse will typically make your follow-up appointment when she calls you with your pathology report.  Expect your pathology report 2-3 business days after your surgery.  You may call to check if you do not hear from us  after three days. OTHER INSTRUCTIONS: _______________________________________________________________________________________________ _____________________________________________________________________________________________________________________________________ _____________________________________________________________________________________________________________________________________ _____________________________________________________________________________________________________________________________________  WHEN TO CALL YOUR DOCTOR: Fever over 101.0 Nausea and/or vomiting. Extreme swelling or bruising. Continued bleeding from incision. Increased pain, redness, or drainage from the incision.  The clinic staff is available to answer your questions during regular business hours.  Please don't hesitate to call and ask to speak to one of the nurses for clinical concerns.  If you have a medical emergency, go to the nearest emergency room or call 911.  A surgeon from [facility] is always on call at the hospital.  For further questions, please visit [email or web address]     Post Anesthesia Home Care Instructions  Activity: Get plenty of rest for the remainder  of the day. A responsible individual must stay with you for 24 hours following the procedure.  For the next 24 hours, DO NOT: -Drive a car -Advertising copywriter -Drink alcoholic beverages -Take any medication unless instructed by your physician -Make any legal decisions or sign important papers.  Meals: Start with liquid foods such as gelatin or soup. Progress to regular foods as tolerated. Avoid greasy, spicy, heavy foods. If nausea and/or vomiting occur, drink only clear liquids until the nausea and/or vomiting subsides. Call your physician if vomiting continues.  Special Instructions/Symptoms: Your throat may feel dry or sore from the anesthesia or the breathing tube placed in your throat during surgery. If this causes discomfort, gargle with warm salt water. The discomfort should disappear within 24 hours.  If you had a scopolamine patch placed behind your ear for the management of post- operative nausea and/or vomiting:  1. The medication in the patch is effective for 72 hours, after which it should be removed.  Wrap patch in a tissue and discard in the trash. Wash hands thoroughly with soap and water. 2. You may remove the patch earlier than 72 hours if you experience unpleasant side effects which may include dry mouth, dizziness or visual disturbances. 3. Avoid touching the patch. Wash your hands with soap and water after contact with the patch.    Next dose of Tylenol  may be given at 1:18pm if needed.

## 2024-01-17 NOTE — Transfer of Care (Signed)
 Immediate Anesthesia Transfer of Care Note  Patient: Wendy Parks  Procedure(s) Performed: EXCISION, MASS, BREAST, USING RADIOLOGICAL MARKER (Right: Breast)  Patient Location: PACU  Anesthesia Type:General  Level of Consciousness: drowsy  Airway & Oxygen Therapy: Patient Spontanous Breathing and Patient connected to face mask oxygen  Post-op Assessment: Report given to RN and Post -op Vital signs reviewed and stable  Post vital signs: Reviewed and stable  Last Vitals:  Vitals Value Taken Time  BP 127/82 01/17/24 0921  Temp    Pulse 74 01/17/24 0922  Resp 19 01/17/24 0922  SpO2 100 % 01/17/24 0922  Vitals shown include unfiled device data.  Last Pain:  Vitals:   01/17/24 0707  TempSrc: Tympanic  PainSc: 0-No pain      Patients Stated Pain Goal: 5 (01/17/24 0707)  Complications: No notable events documented.

## 2024-01-17 NOTE — Anesthesia Procedure Notes (Signed)
 Procedure Name: LMA Insertion Date/Time: 01/17/2024 8:33 AM  Performed by: Sol Duos, CRNAPre-anesthesia Checklist: Patient identified, Emergency Drugs available, Suction available and Patient being monitored Patient Re-evaluated:Patient Re-evaluated prior to induction Oxygen Delivery Method: Circle system utilized Preoxygenation: Pre-oxygenation with 100% oxygen Induction Type: IV induction Ventilation: Mask ventilation without difficulty LMA: LMA inserted LMA Size: 4.0 Number of attempts: 1 Airway Equipment and Method: Bite block Placement Confirmation: positive ETCO2 and breath sounds checked- equal and bilateral Tube secured with: Tape Dental Injury: Teeth and Oropharynx as per pre-operative assessment

## 2024-01-18 ENCOUNTER — Encounter (HOSPITAL_BASED_OUTPATIENT_CLINIC_OR_DEPARTMENT_OTHER): Payer: Self-pay | Admitting: Surgery

## 2024-01-18 NOTE — Anesthesia Postprocedure Evaluation (Signed)
 Anesthesia Post Note  Patient: Wendy Parks  Procedure(s) Performed: EXCISION, MASS, BREAST, USING RADIOLOGICAL MARKER (Right: Breast)     Patient location during evaluation: PACU Anesthesia Type: General Level of consciousness: awake and alert Pain management: pain level controlled Vital Signs Assessment: post-procedure vital signs reviewed and stable Respiratory status: spontaneous breathing, nonlabored ventilation, respiratory function stable and patient connected to nasal cannula oxygen Cardiovascular status: blood pressure returned to baseline and stable Postop Assessment: no apparent nausea or vomiting Anesthetic complications: no   No notable events documented.  Last Vitals:  Vitals:   01/17/24 1000 01/17/24 1018  BP: 133/89 134/80  Pulse: 61 61  Resp: 10 16  Temp:  (!) 36.2 C  SpO2: 94% 94%    Last Pain:  Vitals:   01/17/24 0707  TempSrc: Tympanic                 Willian Harrow

## 2024-01-19 LAB — SURGICAL PATHOLOGY

## 2024-01-21 ENCOUNTER — Other Ambulatory Visit: Payer: Self-pay | Admitting: Internal Medicine

## 2024-01-22 ENCOUNTER — Ambulatory Visit: Payer: Self-pay | Admitting: Surgery

## 2024-01-22 DIAGNOSIS — F4312 Post-traumatic stress disorder, chronic: Secondary | ICD-10-CM | POA: Diagnosis not present

## 2024-01-24 DIAGNOSIS — G43719 Chronic migraine without aura, intractable, without status migrainosus: Secondary | ICD-10-CM | POA: Diagnosis not present

## 2024-01-26 ENCOUNTER — Other Ambulatory Visit: Payer: Self-pay | Admitting: Internal Medicine

## 2024-01-26 DIAGNOSIS — I10 Essential (primary) hypertension: Secondary | ICD-10-CM

## 2024-02-05 DIAGNOSIS — F4312 Post-traumatic stress disorder, chronic: Secondary | ICD-10-CM | POA: Diagnosis not present

## 2024-02-07 DIAGNOSIS — G43719 Chronic migraine without aura, intractable, without status migrainosus: Secondary | ICD-10-CM | POA: Diagnosis not present

## 2024-02-07 DIAGNOSIS — G518 Other disorders of facial nerve: Secondary | ICD-10-CM | POA: Diagnosis not present

## 2024-02-07 DIAGNOSIS — M542 Cervicalgia: Secondary | ICD-10-CM | POA: Diagnosis not present

## 2024-02-07 DIAGNOSIS — M791 Myalgia, unspecified site: Secondary | ICD-10-CM | POA: Diagnosis not present

## 2024-02-15 ENCOUNTER — Encounter (HOSPITAL_BASED_OUTPATIENT_CLINIC_OR_DEPARTMENT_OTHER): Payer: Self-pay | Admitting: Surgery

## 2024-02-15 NOTE — OR Nursing (Signed)
 Late entry:  Due to a error with the procedure names and radioactive seeds, this procedure was documented incorrectly the day of surgery.  I reviewed the operative note dictated by the surgeon and corrected the OR record to match the completed procedure.  Berwyn Eagles, RN.

## 2024-02-19 DIAGNOSIS — F4312 Post-traumatic stress disorder, chronic: Secondary | ICD-10-CM | POA: Diagnosis not present

## 2024-02-29 DIAGNOSIS — F4312 Post-traumatic stress disorder, chronic: Secondary | ICD-10-CM | POA: Diagnosis not present

## 2024-03-04 ENCOUNTER — Encounter: Payer: Self-pay | Admitting: Internal Medicine

## 2024-03-04 ENCOUNTER — Ambulatory Visit (INDEPENDENT_AMBULATORY_CARE_PROVIDER_SITE_OTHER): Admitting: Internal Medicine

## 2024-03-04 ENCOUNTER — Ambulatory Visit: Payer: BC Managed Care – PPO | Admitting: Internal Medicine

## 2024-03-04 VITALS — BP 126/84 | HR 72 | Temp 98.6°F | Ht 65.0 in | Wt 230.8 lb

## 2024-03-04 DIAGNOSIS — E66812 Obesity, class 2: Secondary | ICD-10-CM | POA: Diagnosis not present

## 2024-03-04 DIAGNOSIS — R7309 Other abnormal glucose: Secondary | ICD-10-CM | POA: Diagnosis not present

## 2024-03-04 DIAGNOSIS — I10 Essential (primary) hypertension: Secondary | ICD-10-CM

## 2024-03-04 DIAGNOSIS — Z6838 Body mass index (BMI) 38.0-38.9, adult: Secondary | ICD-10-CM

## 2024-03-04 DIAGNOSIS — Z8342 Family history of familial hypercholesterolemia: Secondary | ICD-10-CM | POA: Diagnosis not present

## 2024-03-04 DIAGNOSIS — F4312 Post-traumatic stress disorder, chronic: Secondary | ICD-10-CM | POA: Diagnosis not present

## 2024-03-04 MED ORDER — PHENTERMINE HCL 37.5 MG PO TABS: 37.5000 mg | ORAL_TABLET | Freq: Every day | ORAL | 1 refills | Status: DC

## 2024-03-04 MED ORDER — CITALOPRAM HYDROBROMIDE 10 MG PO TABS: 10.0000 mg | ORAL_TABLET | Freq: Every day | ORAL | 1 refills | Status: AC

## 2024-03-04 NOTE — Patient Instructions (Signed)
 Hypertension, Adult Hypertension is another name for high blood pressure. High blood pressure forces your heart to work harder to pump blood. This can cause problems over time. There are two numbers in a blood pressure reading. There is a top number (systolic) over a bottom number (diastolic). It is best to have a blood pressure that is below 120/80. What are the causes? The cause of this condition is not known. Some other conditions can lead to high blood pressure. What increases the risk? Some lifestyle factors can make you more likely to develop high blood pressure: Smoking. Not getting enough exercise or physical activity. Being overweight. Having too much fat, sugar, calories, or salt (sodium) in your diet. Drinking too much alcohol. Other risk factors include: Having any of these conditions: Heart disease. Diabetes. High cholesterol. Kidney disease. Obstructive sleep apnea. Having a family history of high blood pressure and high cholesterol. Age. The risk increases with age. Stress. What are the signs or symptoms? High blood pressure may not cause symptoms. Very high blood pressure (hypertensive crisis) may cause: Headache. Fast or uneven heartbeats (palpitations). Shortness of breath. Nosebleed. Vomiting or feeling like you may vomit (nauseous). Changes in how you see. Very bad chest pain. Feeling dizzy. Seizures. How is this treated? This condition is treated by making healthy lifestyle changes, such as: Eating healthy foods. Exercising more. Drinking less alcohol. Your doctor may prescribe medicine if lifestyle changes do not help enough and if: Your top number is above 130. Your bottom number is above 80. Your personal target blood pressure may vary. Follow these instructions at home: Eating and drinking  If told, follow the DASH eating plan. To follow this plan: Fill one half of your plate at each meal with fruits and vegetables. Fill one fourth of your plate  at each meal with whole grains. Whole grains include whole-wheat pasta, brown rice, and whole-grain bread. Eat or drink low-fat dairy products, such as skim milk or low-fat yogurt. Fill one fourth of your plate at each meal with low-fat (lean) proteins. Low-fat proteins include fish, chicken without skin, eggs, beans, and tofu. Avoid fatty meat, cured and processed meat, or chicken with skin. Avoid pre-made or processed food. Limit the amount of salt in your diet to less than 1,500 mg each day. Do not drink alcohol if: Your doctor tells you not to drink. You are pregnant, may be pregnant, or are planning to become pregnant. If you drink alcohol: Limit how much you have to: 0-1 drink a day for women. 0-2 drinks a day for men. Know how much alcohol is in your drink. In the [place] one drink equals one 12 oz bottle of beer (355 mL), one 5 oz glass of wine (148 mL), or one 1 oz glass of hard liquor (44 mL). Lifestyle  Work with your doctor to stay at a healthy weight or to lose weight. Ask your doctor what the best weight is for you. Get at least 30 minutes of exercise that causes your heart to beat faster (aerobic exercise) most days of the week. This may include walking, swimming, or biking. Get at least 30 minutes of exercise that strengthens your muscles (resistance exercise) at least 3 days a week. This may include lifting weights or doing Pilates. Do not smoke or use any products that contain nicotine or tobacco. If you need help quitting, ask your doctor. Check your blood pressure at home as told by your doctor. Keep all follow-up visits. Medicines Take over-the-counter and prescription medicines  only as told by your doctor. Follow directions carefully. Do not skip doses of blood pressure medicine. The medicine does not work as well if you skip doses. Skipping doses also puts you at risk for problems. Ask your doctor about side effects or reactions to medicines that you should watch  for. Contact a doctor if: You think you are having a reaction to the medicine you are taking. You have headaches that keep coming back. You feel dizzy. You have swelling in your ankles. You have trouble with your vision. Get help right away if: You get a very bad headache. You start to feel mixed up (confused). You feel weak or numb. You feel faint. You have very bad pain in your: Chest. Belly (abdomen). You vomit more than once. You have trouble breathing. These symptoms may be an emergency. Get help right away. Call 911. Do not wait to see if the symptoms will go away. Do not drive yourself to the hospital. Summary Hypertension is another name for high blood pressure. High blood pressure forces your heart to work harder to pump blood. For most people, a normal blood pressure is less than 120/80. Making healthy choices can help lower blood pressure. If your blood pressure does not get lower with healthy choices, you may need to take medicine. This information is not intended to replace advice given to you by your health care provider. Make sure you discuss any questions you have with your health care provider. Document Revised: 05/27/2021 Document Reviewed: 05/27/2021 Elsevier Patient Education  2024 [employer].

## 2024-03-04 NOTE — Assessment & Plan Note (Addendum)
 She is interested in restarting phentermine  for weight loss. - Prescribe phentermine  with instructions to start with half a dose for two weeks before full dose. - She is encouraged to strive for BMI less than 30 to decrease cardiac risk. Advised to aim for at least 150 minutes of exercise per week.

## 2024-03-04 NOTE — Progress Notes (Signed)
 I,[name], CMA,acting as a Neurosurgeon for [name], MD.,have documented all relevant documentation on the behalf of [name], MD,as directed by  [name], MD while in the presence of [name], MD.  Subjective:  Patient ID: Wendy Parks , female    DOB: 10/08/1980 , 43 y.o.   MRN: [id number]  Chief Complaint  Patient presents with   Hypertension    Patient presents today for bpc. She reports compliance with medications. Denies headache, chest pain & sob. She will call to make pap smear appointment.     HPI Discussed the use of AI scribe software for clinical note transcription with the patient, who gave verbal consent to proceed.  History of Present Illness Wendy Parks is a 43 year old female who presents for a blood pressure check.  She is here for a blood pressure check following recent breast surgery, which is still healing without any issues or episodes related to the surgery.  She is concerned about her A1c levels, experiencing fatigue and jitteriness when not eating, despite normally being able to fast. She stays hydrated during fasting and is interested in checking her A1c levels.  She is trying to increase physical activity by walking more, although she cannot lift weights due to an arm injury sustained at work. She is undergoing occupational therapy for this injury and walks early in the morning or uses a treadmill when it rains.  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  Current medications include Ajovy, albuterol  as needed, amlodipine , Botox for migraines, citalopram , and Maxalt as needed. She has not taken phentermine  this year. She takes citalopram  in the evening.   Hypertension This is a new problem. The current episode started more than 1 month ago. The problem has been gradually  improving since onset. Pertinent negatives include no blurred vision, neck pain or peripheral edema. Past treatments include calcium channel blockers. The current treatment provides moderate improvement.     Past Medical History:  Diagnosis Date   Headache    Migranes   Hypertension    Kidney stone      Family History  Problem Relation Age of Onset   Healthy Mother    Dementia Father    Cancer Father      Current Outpatient Medications:    AJOVY 225 MG/1.5ML SOAJ, Inject 1.5 mLs into the skin every 30 (thirty) days., Disp: , Rfl:    albuterol  (VENTOLIN  HFA) 108 (90 Base) MCG/ACT inhaler, TAKE 2 PUFFS BY MOUTH EVERY 6 HOURS AS NEEDED FOR WHEEZE OR SHORTNESS OF BREATH, Disp: 18 each, Rfl: 3   amLODipine  (NORVASC ) 2.5 MG tablet, TAKE 1 TABLET BY MOUTH EVERYDAY AT BEDTIME, Disp: 90 tablet, Rfl: 2   baclofen (LIORESAL) 10 MG tablet, Take 10 mg by mouth 2 (two) times daily as needed., Disp: , Rfl:    BOTOX 100 units SOLR injection, Inject 100 Units into the muscle every 3 (three) months., Disp: , Rfl:    cholecalciferol (VITAMIN D3) 25 MCG (1000 UNIT) tablet, Take 1,000 Units by mouth daily., Disp: , Rfl:    rizatriptan (MAXALT) 10 MG tablet, Take 5 mg by mouth as needed for migraine., Disp: , Rfl:    citalopram  (CELEXA ) 10 MG tablet, Take 1 tablet (10 mg total) by mouth daily., Disp: 90 tablet, Rfl: 1   phentermine  (  ADIPEX-P ) 37.5 MG tablet, Take 1 tablet (37.5 mg total) by mouth daily before breakfast., Disp: 30 tablet, Rfl: 1   No Known Allergies   Review of Systems  Constitutional: Negative.   Eyes:  Negative for blurred vision.  Respiratory: Negative.    Cardiovascular: Negative.   Gastrointestinal: Negative.   Musculoskeletal:  Negative for neck pain.  Neurological: Negative.   Psychiatric/Behavioral: Negative.       Today's Vitals   03/04/24 0902  BP: 126/84  Pulse: 72  Temp: 98.6 F (37 C)  SpO2: 98%  Weight: 230 lb 12.8 oz (104.7 kg)  Height: 5' 5 (1.651 m)    Body mass index is 38.41 kg/m.  Wt Readings from Last 3 Encounters:  03/04/24 230 lb 12.8 oz (104.7 kg)  01/17/24 222 lb 7.1 oz (100.9 kg)  01/02/24 227 lb 3.2 oz (103.1 kg)     Objective:  Physical Exam Vitals and nursing note reviewed.  Constitutional:      Appearance: Normal appearance. She is obese.  HENT:     Head: Normocephalic and atraumatic.  Eyes:     Extraocular Movements: Extraocular movements intact.  Cardiovascular:     Rate and Rhythm: Normal rate and regular rhythm.     Heart sounds: Normal heart sounds.  Pulmonary:     Effort: Pulmonary effort is normal.     Breath sounds: Normal breath sounds.  Musculoskeletal:     Cervical back: Normal range of motion.  Skin:    General: Skin is warm.  Neurological:     General: No focal deficit present.     Mental Status: She is alert.  Psychiatric:        Mood and Affect: Mood normal.        Behavior: Behavior normal.         Assessment And Plan:  Primary hypertension Assessment & Plan: Chronic, fair control. Goal BP<130/80.  She will continue with amlodipine  2.5mg  daily.  - Reminded to follow low sodium diet.   Orders: -     CBC -     CMP14+EGFR  Other abnormal glucose Assessment & Plan: Previous labs reviewed, her A1c has been elevated in the past. I will check an A1c today. Reminded to avoid refined sugars including sugary drinks/foods and processed meats including bacon, sausages and deli meats. Reports fatigue and jitteriness during fasting, indicating potential blood sugar control issues. - Order A1c and insulin  level tests. - Advise prioritizing protein intake.   Orders: -     CMP14+EGFR -     Hemoglobin A1c -     Insulin , random  Class 2 severe obesity due to excess calories with serious comorbidity and body mass index (BMI) of 38.0 to 38.9 in adult [facility]) Assessment & Plan: She is interested in restarting phentermine  for weight loss. - Prescribe phentermine  with instructions to start with  half a dose for two weeks before full dose. - She is encouraged to strive for BMI less than 30 to decrease cardiac risk. Advised to aim for at least 150 minutes of exercise per week.    Family history of high cholesterol -     Lipoprotein A (LPA)  Other orders -     Citalopram  Hydrobromide; Take 1 tablet (10 mg total) by mouth daily.  Dispense: 90 tablet; Refill: 1 -     Phentermine  HCl; Take 1 tablet (37.5 mg total) by mouth daily before breakfast.  Dispense: 30 tablet; Refill: 1   Return in 10 weeks (on 05/13/2024), or  weight check.  Patient was given opportunity to ask questions. Patient verbalized understanding of the plan and was able to repeat key elements of the plan. All questions were answered to their satisfaction.   I, [name], MD, have reviewed all documentation for this visit. The documentation on 03/04/24 for the exam, diagnosis, procedures, and orders are all accurate and complete.   IF YOU HAVE BEEN REFERRED TO A SPECIALIST, IT MAY TAKE 1-2 WEEKS TO SCHEDULE/PROCESS THE REFERRAL. IF YOU HAVE NOT HEARD FROM US /SPECIALIST IN TWO WEEKS, PLEASE GIVE US  A CALL AT [phone] X [phone].   THE PATIENT IS ENCOURAGED TO PRACTICE SOCIAL DISTANCING DUE TO THE COVID-19 PANDEMIC.

## 2024-03-05 ENCOUNTER — Encounter: Payer: Self-pay | Admitting: Internal Medicine

## 2024-03-05 ENCOUNTER — Ambulatory Visit: Payer: Self-pay | Admitting: Internal Medicine

## 2024-03-05 LAB — CBC
Hematocrit: 39.8 % (ref 34.0–46.6)
Hemoglobin: 12.9 g/dL (ref 11.1–15.9)
MCH: 29.4 pg (ref 26.6–33.0)
MCHC: 32.4 g/dL (ref 31.5–35.7)
MCV: 91 fL (ref 79–97)
Platelets: 299 x10E3/uL (ref 150–450)
RBC: 4.39 x10E6/uL (ref 3.77–5.28)
RDW: 13 % (ref 11.7–15.4)
WBC: 10.7 x10E3/uL (ref 3.4–10.8)

## 2024-03-05 LAB — CMP14+EGFR
ALT: 11 IU/L (ref 0–32)
AST: 15 IU/L (ref 0–40)
Albumin: 4.1 g/dL (ref 3.9–4.9)
Alkaline Phosphatase: 86 IU/L (ref 44–121)
BUN/Creatinine Ratio: 17 (ref 9–23)
BUN: 12 mg/dL (ref 6–24)
Bilirubin Total: 0.4 mg/dL (ref 0.0–1.2)
CO2: 19 mmol/L — ABNORMAL LOW (ref 20–29)
Calcium: 9 mg/dL (ref 8.7–10.2)
Chloride: 102 mmol/L (ref 96–106)
Creatinine, Ser: 0.72 mg/dL (ref 0.57–1.00)
Globulin, Total: 2.8 g/dL (ref 1.5–4.5)
Glucose: 113 mg/dL — ABNORMAL HIGH (ref 70–99)
Potassium: 4.1 mmol/L (ref 3.5–5.2)
Sodium: 139 mmol/L (ref 134–144)
Total Protein: 6.9 g/dL (ref 6.0–8.5)
eGFR: 106 mL/min/1.73 (ref >59)

## 2024-03-05 LAB — INSULIN, RANDOM: INSULIN: 18.9 u[IU]/mL (ref 2.6–24.9)

## 2024-03-05 LAB — HEMOGLOBIN A1C
Est. average glucose Bld gHb Est-mCnc: 131 mg/dL
Hgb A1c MFr Bld: 6.2 % — ABNORMAL HIGH (ref 4.8–5.6)

## 2024-03-05 LAB — LIPOPROTEIN A (LPA): Lipoprotein (a): 79.8 nmol/L — ABNORMAL HIGH (ref <75.0)

## 2024-03-07 DIAGNOSIS — G518 Other disorders of facial nerve: Secondary | ICD-10-CM | POA: Diagnosis not present

## 2024-03-07 DIAGNOSIS — M791 Myalgia, unspecified site: Secondary | ICD-10-CM | POA: Diagnosis not present

## 2024-03-07 DIAGNOSIS — M542 Cervicalgia: Secondary | ICD-10-CM | POA: Diagnosis not present

## 2024-03-07 DIAGNOSIS — G43719 Chronic migraine without aura, intractable, without status migrainosus: Secondary | ICD-10-CM | POA: Diagnosis not present

## 2024-03-10 NOTE — Assessment & Plan Note (Signed)
 Chronic, fair control. Goal BP<130/80.  She will continue with amlodipine  2.5mg  daily.  - Reminded to follow low sodium diet.

## 2024-03-10 NOTE — Assessment & Plan Note (Addendum)
 Previous labs reviewed, her A1c has been elevated in the past. I will check an A1c today. Reminded to avoid refined sugars including sugary drinks/foods and processed meats including bacon, sausages and deli meats. Reports fatigue and jitteriness during fasting, indicating potential blood sugar control issues. - Order A1c and insulin  level tests. - Advise prioritizing protein intake.

## 2024-03-11 ENCOUNTER — Ambulatory Visit: Admitting: Internal Medicine

## 2024-03-11 ENCOUNTER — Encounter: Payer: Self-pay | Admitting: Internal Medicine

## 2024-03-11 ENCOUNTER — Other Ambulatory Visit: Payer: Self-pay | Admitting: Internal Medicine

## 2024-03-11 VITALS — BP 132/88 | HR 83 | Temp 98.1°F | Ht 65.0 in | Wt 225.6 lb

## 2024-03-11 DIAGNOSIS — I10 Essential (primary) hypertension: Secondary | ICD-10-CM

## 2024-03-11 DIAGNOSIS — G43E19 Chronic migraine with aura, intractable, without status migrainosus: Secondary | ICD-10-CM

## 2024-03-11 DIAGNOSIS — R7303 Prediabetes: Secondary | ICD-10-CM | POA: Insufficient documentation

## 2024-03-11 MED ORDER — KETOROLAC TROMETHAMINE 60 MG/2ML IM SOLN
60.0000 mg | Freq: Once | INTRAMUSCULAR | Status: AC
  Administered 2024-03-11: 60 mg via INTRAMUSCULAR

## 2024-03-11 MED ORDER — ONDANSETRON HCL 4 MG PO TABS: 4.0000 mg | ORAL_TABLET | Freq: Three times a day (TID) | ORAL | 0 refills | Status: DC | PRN

## 2024-03-11 MED ORDER — PROMETHAZINE HCL 25 MG/ML IJ SOLN
12.5000 mg | Freq: Once | INTRAMUSCULAR | Status: AC
  Administered 2024-03-11: 12.5 mg via INTRAMUSCULAR

## 2024-03-11 MED ORDER — AMLODIPINE BESYLATE 2.5 MG PO TABS: ORAL_TABLET | ORAL | 2 refills | Status: DC

## 2024-03-11 MED ORDER — METFORMIN HCL ER 500 MG PO TB24: 500.0000 mg | ORAL_TABLET | Freq: Every day | ORAL | 0 refills | Status: DC

## 2024-03-11 MED ORDER — BUTALBITAL-APAP-CAFFEINE 50-325-40 MG PO TABS: 1.0000 | ORAL_TABLET | Freq: Two times a day (BID) | ORAL | 0 refills | Status: DC | PRN

## 2024-03-11 NOTE — Progress Notes (Signed)
 I,Wendy Parks, CMA,acting as a Neurosurgeon for Wendy LOISE Slocumb, MD.,have documented all relevant documentation on the behalf of Wendy LOISE Slocumb, MD,as directed by  Wendy LOISE Slocumb, MD while in the presence of Wendy LOISE Slocumb, MD.  Subjective:  Patient ID: Wendy Parks , female    DOB: 10-Jun-1981 , 43 y.o.   MRN: [id number]  Chief Complaint  Patient presents with   Migraine    Patient presents today for Migraine. This initially started a week ago. She still experiences nausea, blurred vision, light sensitivity. Thursday she went to see Neurologist, Dr Oneita. She was given nerve block injections. She admits the injections did not help. She is to follow up on 7/31. She has no taken anything over the counter. She does take prescribed rx Baclofen & Rizatriptan. She states neither have helped.     HPI Discussed the use of AI scribe software for clinical note transcription with the patient, who gave verbal consent to proceed.  History of Present Illness Wendy Parks is a 43 year old female with chronic migraines who presents with a persistent migraine lasting one week.  She has been experiencing a persistent migraine for the past week, which has not responded to her usual treatments. The headache is described as a 'vice gripping my head', diffuse, affecting the entire head, and sometimes shifting location. She experiences visual auras, described as circles, along with dizziness and severe visual disturbances. The migraine has been constant, without any relief, impacting her ability to work.  Her current migraine management includes alternating nerve block injections and Botox every six weeks, with the next nerve block scheduled for the 31st. She takes Maxalt, baclofen, and rizatriptan regularly, and is also on Ajovy. She has not tried [place] or [employer]	[place]. In the past, Toradol  has been effective in breaking up her migraines, and she has also used Benadryl  and Phenergan  without success for this episode.  She experiences between ten and eighteen migraines per month and uses a muscle relaxer as part of her treatment regimen. No allergies to medications are reported.  She has a history of prediabetes and is considering starting metformin . She has been prescribed phentermine  in the past but has only taken a half dose once due to her current condition.  Her blood pressure has been elevated, running in the 130s, which she attributes to her migraine pain. She has been taking two doses of amlodipine  to manage her blood pressure, which she started on her own due to worsening migraines.   Migraine  This is a chronic problem. The current episode started 1 to 4 weeks ago. The problem occurs daily. The problem has been gradually worsening. The pain is located in the Bilateral and frontal region. The pain quality is similar to prior headaches. The quality of the pain is described as aching. The pain is at a severity of 9/10. The pain is severe. Associated symptoms include nausea. Pertinent negatives include no anorexia or back pain.     Past Medical History:  Diagnosis Date   Headache    Migranes   Hypertension    Kidney stone      Family History  Problem Relation Age of Onset   Healthy Mother    Dementia Father    Cancer Father      Current Outpatient Medications:    AJOVY 225 MG/1.5ML SOAJ, Inject 1.5 mLs into the skin every 30 (thirty) days., Disp: , Rfl:    albuterol  (VENTOLIN  HFA) 108 (90 Base) MCG/ACT inhaler, TAKE 2 PUFFS  BY MOUTH EVERY 6 HOURS AS NEEDED FOR WHEEZE OR SHORTNESS OF BREATH, Disp: 18 each, Rfl: 3   baclofen (LIORESAL) 10 MG tablet, Take 10 mg by mouth 2 (two) times daily as needed., Disp: , Rfl:    BOTOX 100 units SOLR injection, Inject 100 Units into the muscle every 3 (three) months., Disp: , Rfl:    butalbital -acetaminophen -caffeine  (FIORICET) 50-325-40 MG tablet, Take 1-2 tablets by mouth every 12 (twelve) hours as needed for headache., Disp: 20 tablet, Rfl: 0    cholecalciferol (VITAMIN D3) 25 MCG (1000 UNIT) tablet, Take 1,000 Units by mouth daily., Disp: , Rfl:    citalopram  (CELEXA ) 10 MG tablet, Take 1 tablet (10 mg total) by mouth daily., Disp: 90 tablet, Rfl: 1   metFORMIN  (GLUCOPHAGE -XR) 500 MG 24 hr tablet, Take 1 tablet (500 mg total) by mouth daily with breakfast., Disp: 90 tablet, Rfl: 0   ondansetron  (ZOFRAN ) 4 MG tablet, Take 1 tablet (4 mg total) by mouth every 8 (eight) hours as needed for nausea or vomiting., Disp: 20 tablet, Rfl: 0   phentermine  (ADIPEX-P ) 37.5 MG tablet, Take 1 tablet (37.5 mg total) by mouth daily before breakfast., Disp: 30 tablet, Rfl: 1   rizatriptan (MAXALT) 10 MG tablet, Take 5 mg by mouth as needed for migraine., Disp: , Rfl:    amLODipine  (NORVASC ) 2.5 MG tablet, Take 2 tabs po daily, Disp: 180 tablet, Rfl: 2  Current Facility-Administered Medications:    ketorolac  (TORADOL ) injection 60 mg, 60 mg, Intramuscular, Once,    promethazine  (PHENERGAN ) injection 12.5 mg, 12.5 mg, Intramuscular, Once,    No Known Allergies   Review of Systems  Constitutional: Negative.   Respiratory: Negative.    Cardiovascular: Negative.   Gastrointestinal:  Positive for nausea. Negative for anorexia.  Musculoskeletal:  Negative for back pain.  Neurological:  Positive for headaches.  Psychiatric/Behavioral: Negative.       Today's Vitals   03/11/24 0836  BP: 132/88  Pulse: 83  Temp: 98.1 F (36.7 C)  SpO2: 98%  Weight: 225 lb 9.6 oz (102.3 kg)  Height: 5' 5 (1.651 m)   Body mass index is 37.54 kg/m.  Wt Readings from Last 3 Encounters:  03/11/24 225 lb 9.6 oz (102.3 kg)  03/04/24 230 lb 12.8 oz (104.7 kg)  01/17/24 222 lb 7.1 oz (100.9 kg)    BP Readings from Last 3 Encounters:  03/11/24 132/88  03/04/24 126/84  01/17/24 134/80    Objective:  Physical Exam Vitals and nursing note reviewed.  Constitutional:      General: She is in acute distress.     Appearance: Normal appearance.  HENT:     Head:  Normocephalic and atraumatic.     Comments: She is wearing shades.  Eyes:     Extraocular Movements: Extraocular movements intact.  Cardiovascular:     Rate and Rhythm: Normal rate and regular rhythm.     Heart sounds: Normal heart sounds.  Pulmonary:     Effort: Pulmonary effort is normal.     Breath sounds: Normal breath sounds.  Musculoskeletal:     Cervical back: Normal range of motion.  Skin:    General: Skin is warm.  Neurological:     General: No focal deficit present.     Mental Status: She is alert.  Psychiatric:        Mood and Affect: Mood normal.        Behavior: Behavior normal.         Assessment And Plan:  Intractable chronic migraine with aura and without status migrainosus Assessment & Plan: Chronic migraine with aura, severe episode unresponsive to current medications. Alternates nerve block injections and Botox every six weeks. Current medications: Maxalt, baclofen, rizatriptan, Ajovy. Not tried [place] or Nurtec. Migraine affects work and daily activities. Toradol  effective in past. - Administer Toradol  injection to break migraine cycle. - Prescribe Fioricet for symptom management. - Agrees to Neuro referral since her sx are not getting any better. - Provide work note excusing absence until July 23rd, extendable if symptoms persist. - Recommend taking muscle relaxer today. - Offer Phenergan  for nausea if needed.  Orders: -     Ketorolac  Tromethamine  -     Promethazine  HCl -     Ambulatory referral to Neurology  Prediabetes Assessment & Plan: Prediabetes with plan to start metformin . Advised to delay metformin  until migraine resolves. Long-acting metformin  chosen to minimize gastrointestinal side effects. - Prescribe 90-day supply of metformin , to start post-migraine resolution. - Schedule follow-up four weeks after starting metformin  to recheck kidney function.   Primary hypertension Assessment & Plan:  Elevated blood pressure, possibly exacerbated  by migraine pain. She admits she has been taking two doses of amlodipine  due to worsening migraine symptoms. - Adjust amlodipine  prescription to current dosing of two tablets. - Monitor blood pressure when not experiencing acute migraine for further adjustment.  Orders: -     amLODIPine  Besylate; Take 2 tabs po daily  Dispense: 180 tablet; Refill: 2  Other orders -     metFORMIN  HCl ER; Take 1 tablet (500 mg total) by mouth daily with breakfast.  Dispense: 90 tablet; Refill: 0 -     Butalbital -APAP-Caffeine ; Take 1-2 tablets by mouth every 12 (twelve) hours as needed for headache.  Dispense: 20 tablet; Refill: 0 -     Ondansetron  HCl; Take 1 tablet (4 mg total) by mouth every 8 (eight) hours as needed for nausea or vomiting.  Dispense: 20 tablet; Refill: 0   Return if symptoms worsen or fail to improve.  Patient was given opportunity to ask questions. Patient verbalized understanding of the plan and was able to repeat key elements of the plan. All questions were answered to their satisfaction.   I, Wendy LOISE Slocumb, MD, have reviewed all documentation for this visit. The documentation on 03/11/24 for the exam, diagnosis, procedures, and orders are all accurate and complete.   IF YOU HAVE BEEN REFERRED TO A SPECIALIST, IT MAY TAKE 1-2 WEEKS TO SCHEDULE/PROCESS THE REFERRAL. IF YOU HAVE NOT HEARD FROM US /SPECIALIST IN TWO WEEKS, PLEASE GIVE US  A CALL AT [phone].   THE PATIENT IS ENCOURAGED TO PRACTICE SOCIAL DISTANCING DUE TO THE COVID-19 PANDEMIC.

## 2024-03-11 NOTE — Patient Instructions (Signed)

## 2024-03-11 NOTE — Assessment & Plan Note (Signed)
 Prediabetes with plan to start metformin . Advised to delay metformin  until migraine resolves. Long-acting metformin  chosen to minimize gastrointestinal side effects. - Prescribe 90-day supply of metformin , to start post-migraine resolution. - Schedule follow-up four weeks after starting metformin  to recheck kidney function.

## 2024-03-11 NOTE — Assessment & Plan Note (Signed)
 Elevated blood pressure, possibly exacerbated by migraine pain. She admits she has been taking two doses of amlodipine  due to worsening migraine symptoms. - Adjust amlodipine  prescription to current dosing of two tablets. - Monitor blood pressure when not experiencing acute migraine for further adjustment.

## 2024-03-11 NOTE — Assessment & Plan Note (Signed)
 Chronic migraine with aura, severe episode unresponsive to current medications. Alternates nerve block injections and Botox every six weeks. Current medications: Maxalt, baclofen, rizatriptan, Ajovy. Not tried [place] or Nurtec. Migraine affects work and daily activities. Toradol  effective in past. - Administer Toradol  injection to break migraine cycle. - Prescribe Fioricet for symptom management. - Agrees to Neuro referral since her sx are not getting any better. - Provide work note excusing absence until July 23rd, extendable if symptoms persist. - Recommend taking muscle relaxer today. - Offer Phenergan  for nausea if needed.

## 2024-03-16 ENCOUNTER — Encounter (HOSPITAL_COMMUNITY): Payer: Self-pay | Admitting: Emergency Medicine

## 2024-03-16 ENCOUNTER — Inpatient Hospital Stay (HOSPITAL_COMMUNITY)
Admission: EM | Admit: 2024-03-16 | Discharge: 2024-03-19 | DRG: 103 | Disposition: A | Discharge status: Home / Self Care | Attending: Family Medicine | Admitting: Family Medicine

## 2024-03-16 ENCOUNTER — Other Ambulatory Visit: Payer: Self-pay

## 2024-03-16 ENCOUNTER — Emergency Department (HOSPITAL_COMMUNITY)

## 2024-03-16 DIAGNOSIS — Z79899 Other long term (current) drug therapy: Secondary | ICD-10-CM

## 2024-03-16 DIAGNOSIS — R7303 Prediabetes: Secondary | ICD-10-CM | POA: Diagnosis not present

## 2024-03-16 DIAGNOSIS — E876 Hypokalemia: Secondary | ICD-10-CM | POA: Diagnosis not present

## 2024-03-16 DIAGNOSIS — G43511 Persistent migraine aura without cerebral infarction, intractable, with status migrainosus: Principal | ICD-10-CM

## 2024-03-16 DIAGNOSIS — D72829 Elevated white blood cell count, unspecified: Secondary | ICD-10-CM | POA: Diagnosis not present

## 2024-03-16 DIAGNOSIS — Z8679 Personal history of other diseases of the circulatory system: Secondary | ICD-10-CM | POA: Diagnosis not present

## 2024-03-16 DIAGNOSIS — I10 Essential (primary) hypertension: Secondary | ICD-10-CM | POA: Diagnosis not present

## 2024-03-16 DIAGNOSIS — E66812 Obesity, class 2: Secondary | ICD-10-CM | POA: Diagnosis not present

## 2024-03-16 DIAGNOSIS — F4312 Post-traumatic stress disorder, chronic: Secondary | ICD-10-CM | POA: Diagnosis not present

## 2024-03-16 DIAGNOSIS — Z809 Family history of malignant neoplasm, unspecified: Secondary | ICD-10-CM | POA: Diagnosis not present

## 2024-03-16 DIAGNOSIS — G43919 Migraine, unspecified, intractable, without status migrainosus: Secondary | ICD-10-CM | POA: Diagnosis not present

## 2024-03-16 DIAGNOSIS — G43111 Migraine with aura, intractable, with status migrainosus: Principal | ICD-10-CM | POA: Diagnosis present

## 2024-03-16 DIAGNOSIS — G43109 Migraine with aura, not intractable, without status migrainosus: Secondary | ICD-10-CM | POA: Diagnosis not present

## 2024-03-16 DIAGNOSIS — Z7984 Long term (current) use of oral hypoglycemic drugs: Secondary | ICD-10-CM

## 2024-03-16 DIAGNOSIS — G43909 Migraine, unspecified, not intractable, without status migrainosus: Secondary | ICD-10-CM | POA: Diagnosis not present

## 2024-03-16 DIAGNOSIS — R0989 Other specified symptoms and signs involving the circulatory and respiratory systems: Secondary | ICD-10-CM | POA: Diagnosis not present

## 2024-03-16 DIAGNOSIS — Z820 Family history of epilepsy and other diseases of the nervous system: Secondary | ICD-10-CM

## 2024-03-16 DIAGNOSIS — Z6837 Body mass index (BMI) 37.0-37.9, adult: Secondary | ICD-10-CM | POA: Diagnosis not present

## 2024-03-16 DIAGNOSIS — K219 Gastro-esophageal reflux disease without esophagitis: Secondary | ICD-10-CM | POA: Diagnosis present

## 2024-03-16 DIAGNOSIS — R519 Headache, unspecified: Secondary | ICD-10-CM | POA: Diagnosis not present

## 2024-03-16 DIAGNOSIS — E6609 Other obesity due to excess calories: Secondary | ICD-10-CM | POA: Diagnosis not present

## 2024-03-16 LAB — CBC WITH DIFFERENTIAL/PLATELET
Abs Immature Granulocytes: 0.08 K/uL — ABNORMAL HIGH (ref 0.00–0.07)
Basophils Absolute: 0 K/uL (ref 0.0–0.1)
Basophils Relative: 0 %
Eosinophils Absolute: 0.1 K/uL (ref 0.0–0.5)
Eosinophils Relative: 1 %
HCT: 44.2 % (ref 36.0–46.0)
Hemoglobin: 14.5 g/dL (ref 12.0–15.0)
Immature Granulocytes: 1 %
Lymphocytes Relative: 27 %
Lymphs Abs: 2.8 K/uL (ref 0.7–4.0)
MCH: 29.7 pg (ref 26.0–34.0)
MCHC: 32.8 g/dL (ref 30.0–36.0)
MCV: 90.4 fL (ref 80.0–100.0)
Monocytes Absolute: 0.1 K/uL (ref 0.1–1.0)
Monocytes Relative: 1 %
Neutro Abs: 7.2 K/uL (ref 1.7–7.7)
Neutrophils Relative %: 70 %
Platelets: 350 K/uL (ref 150–400)
RBC: 4.89 MIL/uL (ref 3.87–5.11)
RDW: 13.2 % (ref 11.5–15.5)
WBC: 10.2 K/uL (ref 4.0–10.5)
nRBC: 0 % (ref 0.0–0.2)

## 2024-03-16 LAB — BASIC METABOLIC PANEL WITH GFR
Anion gap: 8 (ref 5–15)
BUN: 17 mg/dL (ref 6–20)
CO2: 18 mmol/L — ABNORMAL LOW (ref 22–32)
Calcium: 8.7 mg/dL — ABNORMAL LOW (ref 8.9–10.3)
Chloride: 111 mmol/L (ref 98–111)
Creatinine, Ser: 0.9 mg/dL (ref 0.44–1.00)
GFR, Estimated: >60 mL/min (ref >60)
Glucose, Bld: 133 mg/dL — ABNORMAL HIGH (ref 70–99)
Potassium: 3.7 mmol/L (ref 3.5–5.1)
Sodium: 137 mmol/L (ref 135–145)

## 2024-03-16 LAB — GLUCOSE, CAPILLARY
Glucose-Capillary: 114 mg/dL — ABNORMAL HIGH (ref 70–99)
Glucose-Capillary: 117 mg/dL — ABNORMAL HIGH (ref 70–99)
Glucose-Capillary: 106 mg/dL — ABNORMAL HIGH (ref 70–99)

## 2024-03-16 LAB — HCG, SERUM, QUALITATIVE: Preg, Serum: NEGATIVE

## 2024-03-16 MED ORDER — ACETAMINOPHEN 325 MG PO TABS
650.0000 mg | ORAL_TABLET | Freq: Four times a day (QID) | ORAL | Status: DC | PRN
  Administered 2024-03-16: 650 mg via ORAL
  Filled 2024-03-16: qty 2

## 2024-03-16 MED ORDER — DIPHENHYDRAMINE HCL 50 MG/ML IJ SOLN
25.0000 mg | Freq: Once | INTRAMUSCULAR | Status: AC
  Administered 2024-03-16: 25 mg via INTRAVENOUS
  Filled 2024-03-16: qty 1

## 2024-03-16 MED ORDER — POTASSIUM CHLORIDE IN NACL 40-0.9 MEQ/L-% IV SOLN
INTRAVENOUS | Status: AC
  Filled 2024-03-16: qty 1000

## 2024-03-16 MED ORDER — METOCLOPRAMIDE HCL 5 MG/ML IJ SOLN
10.0000 mg | Freq: Once | INTRAMUSCULAR | Status: AC
  Administered 2024-03-16: 10 mg via INTRAVENOUS
  Filled 2024-03-16: qty 2

## 2024-03-16 MED ORDER — KETOROLAC TROMETHAMINE 30 MG/ML IJ SOLN
30.0000 mg | Freq: Once | INTRAMUSCULAR | Status: AC
  Administered 2024-03-16: 30 mg via INTRAVENOUS
  Filled 2024-03-16: qty 1

## 2024-03-16 MED ORDER — FENTANYL CITRATE PF 50 MCG/ML IJ SOSY
50.0000 ug | PREFILLED_SYRINGE | Freq: Once | INTRAMUSCULAR | Status: DC
  Filled 2024-03-16: qty 1

## 2024-03-16 MED ORDER — ENOXAPARIN SODIUM 40 MG/0.4ML IJ SOSY
40.0000 mg | PREFILLED_SYRINGE | INTRAMUSCULAR | Status: DC
  Administered 2024-03-16 – 2024-03-17 (×2): 40 mg via SUBCUTANEOUS
  Filled 2024-03-16 (×3): qty 0.4

## 2024-03-16 MED ORDER — METOPROLOL TARTRATE 5 MG/5ML IV SOLN
5.0000 mg | Freq: Once | INTRAVENOUS | Status: AC
  Administered 2024-03-16: 5 mg via INTRAVENOUS
  Filled 2024-03-16: qty 5

## 2024-03-16 MED ORDER — MAGNESIUM SULFATE 50 % IJ SOLN: 1.0000 g | Freq: Once | INTRAMUSCULAR | Status: DC

## 2024-03-16 MED ORDER — METOPROLOL TARTRATE 25 MG PO TABS
25.0000 mg | ORAL_TABLET | Freq: Two times a day (BID) | ORAL | Status: DC
  Administered 2024-03-16 – 2024-03-17 (×3): 25 mg via ORAL
  Filled 2024-03-16 (×5): qty 1

## 2024-03-16 MED ORDER — DEXAMETHASONE SODIUM PHOSPHATE 10 MG/ML IJ SOLN
10.0000 mg | Freq: Once | INTRAMUSCULAR | Status: AC
  Administered 2024-03-16: 10 mg via INTRAVENOUS
  Filled 2024-03-16: qty 1

## 2024-03-16 MED ORDER — DIHYDROERGOTAMINE MESYLATE 1 MG/ML IJ SOLN
1.0000 mg | Freq: Once | INTRAMUSCULAR | Status: AC
  Administered 2024-03-16: 1 mg via INTRAVENOUS
  Filled 2024-03-16: qty 1

## 2024-03-16 MED ORDER — PROCHLORPERAZINE EDISYLATE 10 MG/2ML IJ SOLN
10.0000 mg | Freq: Once | INTRAMUSCULAR | Status: AC
  Administered 2024-03-16: 10 mg via INTRAVENOUS
  Filled 2024-03-16: qty 2

## 2024-03-16 MED ORDER — SODIUM CHLORIDE 0.9 % IV BOLUS
1000.0000 mL | Freq: Once | INTRAVENOUS | Status: AC
  Administered 2024-03-16: 1000 mL via INTRAVENOUS

## 2024-03-16 MED ORDER — MAGNESIUM SULFATE 2 GM/50ML IV SOLN
2.0000 g | Freq: Once | INTRAVENOUS | Status: AC
  Administered 2024-03-16: 2 g via INTRAVENOUS
  Filled 2024-03-16: qty 50

## 2024-03-16 MED ORDER — METOPROLOL TARTRATE 25 MG/10 ML ORAL SUSPENSION
25.0000 mg | Freq: Two times a day (BID) | ORAL | Status: DC
  Filled 2024-03-16: qty 10

## 2024-03-16 MED ORDER — KETOROLAC TROMETHAMINE 30 MG/ML IJ SOLN: 30.0000 mg | Freq: Four times a day (QID) | INTRAMUSCULAR | Status: DC | PRN

## 2024-03-16 MED ORDER — SODIUM CHLORIDE 0.9 % IV SOLN
25.0000 mg | Freq: Four times a day (QID) | INTRAVENOUS | Status: DC | PRN
  Administered 2024-03-16 (×2): 25 mg via INTRAVENOUS
  Filled 2024-03-16 (×2): qty 25

## 2024-03-16 MED ORDER — KETOROLAC TROMETHAMINE 30 MG/ML IJ SOLN
30.0000 mg | Freq: Four times a day (QID) | INTRAMUSCULAR | Status: AC | PRN
  Administered 2024-03-16 – 2024-03-17 (×2): 30 mg via INTRAVENOUS
  Filled 2024-03-16 (×2): qty 1

## 2024-03-16 MED ORDER — ACETAMINOPHEN 650 MG RE SUPP: 650.0000 mg | Freq: Four times a day (QID) | RECTAL | Status: DC | PRN

## 2024-03-16 NOTE — Plan of Care (Signed)

## 2024-03-16 NOTE — ED Provider Notes (Addendum)
  Physical Exam  BP 124/73   Pulse 60   Temp 97.8 F (36.6 C) (Oral)   Resp 14   Ht 5' 5 (1.651 m)   Wt 102.1 kg   LMP 03/06/2024 (Exact Date)   SpO2 97%   BMI 37.44 kg/m   Physical Exam Vitals and nursing note reviewed.  Constitutional:      General: She is not in acute distress.    Appearance: She is well-developed.  HENT:     Head: Normocephalic and atraumatic.  Eyes:     Conjunctiva/sclera: Conjunctivae normal.  Cardiovascular:     Rate and Rhythm: Normal rate and regular rhythm.     Heart sounds: No murmur heard. Pulmonary:     Effort: Pulmonary effort is normal. No respiratory distress.     Breath sounds: Normal breath sounds.  Abdominal:     Palpations: Abdomen is soft.     Tenderness: There is no abdominal tenderness.  Musculoskeletal:        General: No swelling.     Cervical back: Neck supple.  Skin:    General: Skin is warm and dry.     Capillary Refill: Capillary refill takes less than 2 seconds.  Neurological:     Mental Status: She is alert.  Psychiatric:        Mood and Affect: Mood normal.     Procedures  Procedures  ED Course / MDM    Medical Decision Making Amount and/or Complexity of Data Reviewed Labs: ordered. Radiology: ordered.  Risk Prescription drug management. Decision regarding hospitalization.     Received patient in signout.  Patient presents because of migraine.  Saw PCP who gave her Toradol  as well as prescribed her Fioricet.  I will to try trigger point injection which did not help as well.  Due for Botox next week.   Pending labs.  Plan is for admission due to chronic headache.  Patient received 2 g mag bolus, DHE infection, liter of fluid, compazine , decadron , toradol  and benadryl .   Patient still remains symptomatic.  Negative Kernig's and Brudzinski's.  No meningeal signs.  No concerns for meningitis.  No concerns for subarachnoid given chronicity of headache.  No risk factors for sinus thrombosis.  Patient  admitted for further care.     Simon Lavonia SAILOR, MD 03/16/24 1011    Simon Lavonia SAILOR, MD 03/16/24 1014

## 2024-03-16 NOTE — ED Triage Notes (Signed)
  Patient comes in with migraine that has been constant since 7/14.  Patient state she was seen by neurologist on 7/17 and given injections, and saw PCP on 7/21 and was given Fioricet and has been taking 1 tablet Q12 since 7/21.  Also taking home topamax , baclofen, and rizatriptan with no relief.  Pain 10/10, sharp/stabbing/throbbing.

## 2024-03-16 NOTE — ED Provider Notes (Signed)
 [place] EMERGENCY DEPARTMENT AT [facility] Provider Note   CSN: [id number] Arrival date & time: 03/16/24  0020     Patient presents with: Migraine   Wendy Parks is a 43 y.o. female.   The history is provided by the patient.  Migraine   She has history of hypertension, migraine headaches and comes in complaining of a migraine headache for about the last 10 days.  She describes a feeling of something like a vice going around her entire head.  She has had her usual aura of seeing zigzags.  Vision has been blurred.  There has been nausea but no vomiting.  She has noted numbness in her face which is unusual for her.  She denies any weakness or extremity numbness.  She has tried taking her Maxalt which usually helps her headaches, but has not been effective for this headache.  She saw her primary care provider gave her an injection of ketorolac  without relief and prescribed butalbital -acetaminophen -caffeine  which also has not been helping.  She did see her neurologist who tried a trigger point injection which has not helped.  She is due for Botox injection next week.  She denies any fever chills or sweats.  She denies stiff neck.    Prior to Admission medications   Medication Sig Start Date End Date Taking? Authorizing Provider  AJOVY 225 MG/1.5ML SOAJ Inject 1.5 mLs into the skin every 30 (thirty) days.    [provider]  albuterol  (VENTOLIN  HFA) 108 (90 Base) MCG/ACT inhaler TAKE 2 PUFFS BY MOUTH EVERY 6 HOURS AS NEEDED FOR WHEEZE OR SHORTNESS OF BREATH 01/22/24   Jarold Medici, MD  amLODipine  (NORVASC ) 2.5 MG tablet Take 2 tabs po daily 03/11/24   Jarold Medici, MD  baclofen (LIORESAL) 10 MG tablet Take 10 mg by mouth 2 (two) times daily as needed. 06/14/19   [provider]  BOTOX 100 units SOLR injection Inject 100 Units into the muscle every 3 (three) months. 12/23/20   [provider]  butalbital -acetaminophen -caffeine  (FIORICET) 50-325-40 MG  tablet Take 1-2 tablets by mouth every 12 (twelve) hours as needed for headache. 03/11/24 03/11/25  Jarold Medici, MD  cholecalciferol (VITAMIN D3) 25 MCG (1000 UNIT) tablet Take 1,000 Units by mouth daily.    [provider]  citalopram  (CELEXA ) 10 MG tablet Take 1 tablet (10 mg total) by mouth daily. 03/04/24   Jarold Medici, MD  metFORMIN  (GLUCOPHAGE -XR) 500 MG 24 hr tablet Take 1 tablet (500 mg total) by mouth daily with breakfast. 03/11/24   Jarold Medici, MD  ondansetron  (ZOFRAN ) 4 MG tablet Take 1 tablet (4 mg total) by mouth every 8 (eight) hours as needed for nausea or vomiting. 03/11/24   Jarold Medici, MD  phentermine  (ADIPEX-P ) 37.5 MG tablet Take 1 tablet (37.5 mg total) by mouth daily before breakfast. 03/04/24   Jarold Medici, MD  rizatriptan (MAXALT) 10 MG tablet Take 5 mg by mouth as needed for migraine.    [provider]    Allergies: Patient has no known allergies.    Review of Systems  All other systems reviewed and are negative.   Updated Vital Signs BP (!) 153/87 (BP Location: Left Arm)   Pulse 64   Temp 97.6 F (36.4 C) (Oral)   Resp 18   Ht 5' 5 (1.651 m)   Wt 102.1 kg   LMP 03/06/2024 (Exact Date)   SpO2 100%   BMI 37.44 kg/m   Physical Exam Vitals and nursing note reviewed.  43 year old female, resting comfortably and in no acute distress. Vital signs are significant for elevated blood pressure. Oxygen saturation is 100%, which is normal. Head is normocephalic and atraumatic. PERRLA, EOMI.  There is tenderness palpation over the temporalis muscles bilaterally and over the insertion of the paracervical muscles bilaterally. Neck is supple without adenopathy. Lungs are clear without rales, wheezes, or rhonchi. Chest is nontender. Heart has regular rate and rhythm without murmur. Abdomen is soft, flat, nontender. Skin is warm and dry without rash. Neurologic: Awake and alert and oriented, cranial nerves are intact including facial  sensation, strength is 5/5 in all 4 extremities, sensation is normal in all 4 extremities.  (all labs ordered are listed, but only abnormal results are displayed) Labs Reviewed - No data to display  EKG: None  Radiology: No results found.   Procedures   Medications Ordered in the ED - No data to display                                  Medical Decision Making Amount and/or Complexity of Data Reviewed Labs: ordered. Radiology: ordered.  Risk Prescription drug management. Decision regarding hospitalization.   Bad headache.  This is a presentation with wide range of treatment options and carries with a high risk of morbidity and complications.  Differential diagnosis includes, but is not limited to, migraine headache, muscle contraction headache, subarachnoid hemorrhage, meningitis.  No physical findings to actually suggest meningitis or subarachnoid hemorrhage.  I have reviewed her past records, and do note office visit on 03/11/2024 for intractable migraine treated with ketorolac  and prescription for butalbital -acetaminophen -caffeine .  I have ordered a migraine cocktail of normal saline, ketorolac , prochlorperazine , diphenhydramine , dexamethasone .  Because headache is different from her prior headaches, I have ordered CT of head.  CT of head shows no acute intracranial abnormality.  Have independently viewed the images, and agree with the radiologist's interpretation.  Unfortunately, patient did not get relief with above-noted treatment.  I have ordered an injection of dihydroergotamine .  She denies any improvement with dihydroergotamine .  I am planning to admit her to try to get control of her headache.  In the meantime, I have ordered intravenous promethazine  and magnesium .  I have ordered laboratory workup of CBC and basic metabolic panel in anticipation of hospital admission.  Case is signed out to Dr. Jackquline to evaluate lab results and arrange for hospital admission.     Final  diagnoses:  None    ED Discharge Orders     None          Raford Lenis, MD 03/16/24 [phone]

## 2024-03-16 NOTE — H&P (Signed)
 History and Physical    Patient: Wendy Parks FMW:[id number] DOB: 03-28-1981 DOA: 03/16/2024 DOS: the patient was seen and examined on 03/16/2024 PCP: Jarold Medici, MD  Patient coming from: Home  Chief Complaint:  Chief Complaint  Patient presents with   Migraine   HPI: Wendy Parks is a 43 y.o. female with medical history significant of allergic rhinitis, atelectasis, amenorrhea, nipple bloody discharge, breast pain, chronic RUQ pain, leiomyoma, ovary cyst, vaginal discharge, esophageal reflux, gastritis, herpes simplex, nephrolithiasis, glucose intolerance, class II obesity, migraine headaches, hypertension, nephrolithiasis who presented to the emergency department with complaints of having a migraine headache with aura for the past 10 days associated with whole facial numbness more pronounced on the right, nausea and photophobia.  She got a point trigger injection by neurology 9 days ago.  She was given ketorolac  and prescribed Fioricet by her PCP without significant results.  She will receive the Botox injection next week.  She denied cervical stiffness, fever, chills, rhinorrhea, sore throat, wheezing or hemoptysis.  No chest pain, palpitations, diaphoresis, PND, orthopnea or pitting edema of the lower extremities.  No emesis, diarrhea, constipation, melena or hematochezia.  No flank pain, dysuria, frequency or hematuria.  No polyuria, polydipsia, polyphagia or blurred vision.   Lab work: CBC was normal.  Serum pregnancy test was negative.  BMP showed a CO2 of 18 mmol/L with a normal anion gap, normal renal function, glucose of 133 and calcium of 8.7 mg/dL.  Imaging: CT head without contrast with no acute findings.  ED course: Initial vital signs were temperature 97.6 F, pulse 64, respiration 18, BP 153/87 mmHg O2 sat 100% on room air.  She received dexamethasone  10 mg IVP, Dihydroergotamine  1 mg IVP x 2, diphenhydramine  25 mg IVP, ketorolac  30 mg IVP x 1, magnesium  sulfate 2 g IVPB, 1000  mL normal saline bolus, prochlorperazine  10 mg IVP and promethazine  25 mg IVP.  I added metoprolol  5 mg IVP, another 1000 mL of normal saline bolus and metoclopramide  10 mg IVP.   Review of Systems: As mentioned in the history of present illness. All other systems reviewed and are negative.  Past Medical History:  Diagnosis Date   Headache    Migranes   Hypertension    Kidney stone    Past Surgical History:  Procedure Laterality Date   BREAST BIOPSY Right 09/21/2023   US  RT BREAST BX W LOC DEV 1ST LESION IMG BX SPEC US  GUIDE 09/21/2023 [facility]   BREAST BIOPSY Right 11/27/2023   US  RT BREAST BX W LOC DEV 1ST LESION IMG BX SPEC US  GUIDE 11/27/2023 [facility]   BREAST BIOPSY  01/16/2024   MM RT RADIOACTIVE SEED LOC MAMMO GUIDE 01/16/2024 [facility]   BUNIONECTOMY  11/16/2020   ENDOMETRIAL ABLATION  01/2019   Dr. Henry   EXTRACORPOREAL SHOCK WAVE LITHOTRIPSY Left 07/10/2023   Procedure: LEFT EXTRACORPOREAL SHOCK WAVE LITHOTRIPSY (ESWL);  Surgeon: Cam Morene ORN, MD;  Location: [facility];  Service: Urology;  Laterality: Left;   KIDNEY STONE SURGERY     RADIOACTIVE SEED GUIDED AXILLARY SENTINEL LYMPH NODE Right 01/17/2024   Procedure: RADIOACTIVE SEED GUIDED EXCISIONAL BREAST BIOPSY;  Surgeon: Belinda Cough, MD;  Location: [facility];  Service: General;  Laterality: Right;  LMA RIGHT BREAST RADIOACTIVE SEED LOCALIZED EXCISIONAL BIOPSY   TUBAL LIGATION  2012   Social History:  reports that she has never smoked. She has never used smokeless tobacco. She reports current alcohol use. She reports that she does  not use drugs.  No Known Allergies  Family History  Problem Relation Age of Onset   Healthy Mother    Dementia Father    Cancer Father     Prior to Admission medications   Medication Sig Start Date End Date Taking? Authorizing Provider  AJOVY 225 MG/1.5ML SOAJ Inject 1.5 mLs into the skin every 30 (thirty) days.   Yes  [provider]  albuterol  (VENTOLIN  HFA) 108 (90 Base) MCG/ACT inhaler TAKE 2 PUFFS BY MOUTH EVERY 6 HOURS AS NEEDED FOR WHEEZE OR SHORTNESS OF BREATH 01/22/24  Yes Jarold Medici, MD  amLODipine  (NORVASC ) 2.5 MG tablet Take 2 tabs po daily 03/11/24  Yes Jarold Medici, MD  baclofen (LIORESAL) 10 MG tablet Take 10 mg by mouth 2 (two) times daily as needed. 06/14/19  Yes [provider]  BOTOX 100 units SOLR injection Inject 100 Units into the muscle every 3 (three) months. 12/23/20  Yes [provider]  butalbital -acetaminophen -caffeine  (FIORICET) 50-325-40 MG tablet Take 1-2 tablets by mouth every 12 (twelve) hours as needed for headache. 03/11/24 03/11/25 Yes Jarold Medici, MD  cholecalciferol (VITAMIN D3) 25 MCG (1000 UNIT) tablet Take 1,000 Units by mouth daily.   Yes [provider]  citalopram  (CELEXA ) 10 MG tablet Take 1 tablet (10 mg total) by mouth daily. 03/04/24  Yes Jarold Medici, MD  metFORMIN  (GLUCOPHAGE -XR) 500 MG 24 hr tablet Take 1 tablet (500 mg total) by mouth daily with breakfast. 03/11/24  Yes Jarold Medici, MD  ondansetron  (ZOFRAN ) 4 MG tablet Take 1 tablet (4 mg total) by mouth every 8 (eight) hours as needed for nausea or vomiting. 03/11/24  Yes Jarold Medici, MD  phentermine  (ADIPEX-P ) 37.5 MG tablet Take 1 tablet (37.5 mg total) by mouth daily before breakfast. 03/04/24  Yes Jarold Medici, MD  rizatriptan (MAXALT) 10 MG tablet Take 5 mg by mouth as needed for migraine.   Yes [provider]    Physical Exam: Vitals:   03/16/24 0443 03/16/24 0730 03/16/24 0800 03/16/24 0856  BP: 124/73 (!) 157/83 (!) 165/82   Pulse: 60 66 73   Resp: 14 16 16    Temp: 97.8 F (36.6 C)   98 F (36.7 C)  TempSrc: Oral   Oral  SpO2: 97% 99% 97%   Weight:      Height:       Physical Exam Vitals and nursing note reviewed.  Constitutional:      General: She is awake. She is not in acute distress.    Appearance: Normal appearance. She is obese.  She is ill-appearing.  HENT:     Head: Normocephalic.     Nose: No rhinorrhea.     Mouth/Throat:     Mouth: Mucous membranes are dry.  Eyes:     General: No scleral icterus.    Pupils: Pupils are equal, round, and reactive to light.  Neck:     Vascular: No JVD.  Cardiovascular:     Rate and Rhythm: Normal rate and regular rhythm.     Heart sounds: S1 normal and S2 normal.  Pulmonary:     Effort: Pulmonary effort is normal.     Breath sounds: Normal breath sounds. No wheezing, rhonchi or rales.  Abdominal:     General: Bowel sounds are normal. There is no distension.     Palpations: Abdomen is soft.     Tenderness: There is no abdominal tenderness.  Musculoskeletal:     Cervical back: Neck supple.     Right lower  leg: No edema.     Left lower leg: No edema.  Skin:    General: Skin is warm and dry.  Neurological:     General: No focal deficit present.     Mental Status: She is alert and oriented to person, place, and time.  Psychiatric:        Mood and Affect: Mood normal.        Behavior: Behavior normal. Behavior is cooperative.     Data Reviewed:  Results are pending, will review when available.  Assessment and Plan: Principal Problem:   Intractable migraine with aura with status migrainosus Observation/telemetry. Monitor QTc closely. Keep in dark room. Continue IV fluids. Analgesics as needed. Toradol  IVP. Antiemetics as needed. Trial of the dihydroergotamine . Discussed with neurology on-call. Follow CBC, CMP in AM.  Active Problems:   Esophageal reflux Antiacid, H2 blocker or PPI as needed.    History of hypertension Hold amlodipine . Will switch to metoprolol  tartrate 25 mg twice daily.    Class 2 obesity due to excess calories  with body mass index (BMI) of 37.0 to 37.9 in adult Current BMI 37.44 kg/m. Would benefit from lifestyle modifications. Follow-up with primary care provider.    Prediabetes Last hemoglobin A1c of 6.2%. Received  dexamethasone  earlier. Carbohydrate modified diet. CBG monitoring before meals and bedtime. If needed, add on RI SS.    Hypocalcemia Recheck calcium with albumin level in AM. Further workup depending on results.      Advance Care Planning:   Code Status: Full Code   Consults:   Family Communication:   Severity of Illness: The appropriate patient status for this patient is OBSERVATION. Observation status is judged to be reasonable and necessary in order to provide the required intensity of service to ensure the patient's safety. The patient's presenting symptoms, physical exam findings, and initial radiographic and laboratory data in the context of their medical condition is felt to place them at decreased risk for further clinical deterioration. Furthermore, it is anticipated that the patient will be medically stable for discharge from the hospital within 2 midnights of admission.   Author: Alm Dorn Castor, MD 03/16/2024 9:57 AM  For on call review [email or web address].   This document was prepared using Dragon voice recognition software and may contain some unintended transcription errors.

## 2024-03-17 ENCOUNTER — Observation Stay (HOSPITAL_COMMUNITY)

## 2024-03-17 DIAGNOSIS — Z820 Family history of epilepsy and other diseases of the nervous system: Secondary | ICD-10-CM | POA: Diagnosis not present

## 2024-03-17 DIAGNOSIS — R7303 Prediabetes: Secondary | ICD-10-CM | POA: Diagnosis present

## 2024-03-17 DIAGNOSIS — E876 Hypokalemia: Secondary | ICD-10-CM | POA: Diagnosis not present

## 2024-03-17 DIAGNOSIS — Z8679 Personal history of other diseases of the circulatory system: Secondary | ICD-10-CM

## 2024-03-17 DIAGNOSIS — K219 Gastro-esophageal reflux disease without esophagitis: Secondary | ICD-10-CM | POA: Diagnosis not present

## 2024-03-17 DIAGNOSIS — Z6837 Body mass index (BMI) 37.0-37.9, adult: Secondary | ICD-10-CM | POA: Diagnosis not present

## 2024-03-17 DIAGNOSIS — E6609 Other obesity due to excess calories: Secondary | ICD-10-CM | POA: Diagnosis not present

## 2024-03-17 DIAGNOSIS — Z79899 Other long term (current) drug therapy: Secondary | ICD-10-CM | POA: Diagnosis not present

## 2024-03-17 DIAGNOSIS — G43919 Migraine, unspecified, intractable, without status migrainosus: Secondary | ICD-10-CM

## 2024-03-17 DIAGNOSIS — Z809 Family history of malignant neoplasm, unspecified: Secondary | ICD-10-CM | POA: Diagnosis not present

## 2024-03-17 DIAGNOSIS — R0989 Other specified symptoms and signs involving the circulatory and respiratory systems: Secondary | ICD-10-CM | POA: Diagnosis not present

## 2024-03-17 DIAGNOSIS — D72829 Elevated white blood cell count, unspecified: Secondary | ICD-10-CM | POA: Diagnosis not present

## 2024-03-17 DIAGNOSIS — G43909 Migraine, unspecified, not intractable, without status migrainosus: Secondary | ICD-10-CM | POA: Diagnosis not present

## 2024-03-17 DIAGNOSIS — F4312 Post-traumatic stress disorder, chronic: Secondary | ICD-10-CM | POA: Diagnosis not present

## 2024-03-17 DIAGNOSIS — E66812 Obesity, class 2: Secondary | ICD-10-CM

## 2024-03-17 DIAGNOSIS — I10 Essential (primary) hypertension: Secondary | ICD-10-CM | POA: Diagnosis present

## 2024-03-17 DIAGNOSIS — Z7984 Long term (current) use of oral hypoglycemic drugs: Secondary | ICD-10-CM | POA: Diagnosis not present

## 2024-03-17 DIAGNOSIS — G43111 Migraine with aura, intractable, with status migrainosus: Secondary | ICD-10-CM | POA: Diagnosis not present

## 2024-03-17 LAB — CBC
HCT: 44.4 % (ref 36.0–46.0)
Hemoglobin: 14.3 g/dL (ref 12.0–15.0)
MCH: 29.6 pg (ref 26.0–34.0)
MCHC: 32.2 g/dL (ref 30.0–36.0)
MCV: 91.9 fL (ref 80.0–100.0)
Platelets: 356 K/uL (ref 150–400)
RBC: 4.83 MIL/uL (ref 3.87–5.11)
RDW: 13.3 % (ref 11.5–15.5)
WBC: 14.6 K/uL — ABNORMAL HIGH (ref 4.0–10.5)
nRBC: 0 % (ref 0.0–0.2)

## 2024-03-17 LAB — COMPREHENSIVE METABOLIC PANEL WITH GFR
ALT: 11 U/L (ref 0–44)
AST: 15 U/L (ref 15–41)
Albumin: 3.3 g/dL — ABNORMAL LOW (ref 3.5–5.0)
Alkaline Phosphatase: 79 U/L (ref 38–126)
Anion gap: 5 (ref 5–15)
BUN: 14 mg/dL (ref 6–20)
CO2: 20 mmol/L — ABNORMAL LOW (ref 22–32)
Calcium: 8.2 mg/dL — ABNORMAL LOW (ref 8.9–10.3)
Chloride: 114 mmol/L — ABNORMAL HIGH (ref 98–111)
Creatinine, Ser: 0.82 mg/dL (ref 0.44–1.00)
GFR, Estimated: >60 mL/min (ref >60)
Glucose, Bld: 124 mg/dL — ABNORMAL HIGH (ref 70–99)
Potassium: 3.4 mmol/L — ABNORMAL LOW (ref 3.5–5.1)
Sodium: 139 mmol/L (ref 135–145)
Total Bilirubin: 0.7 mg/dL (ref 0.0–1.2)
Total Protein: 7.3 g/dL (ref 6.5–8.1)

## 2024-03-17 LAB — URINALYSIS, ROUTINE W REFLEX MICROSCOPIC
Bilirubin Urine: NEGATIVE
Glucose, UA: NEGATIVE mg/dL
Ketones, ur: NEGATIVE mg/dL
Nitrite: NEGATIVE
Protein, ur: NEGATIVE mg/dL
Specific Gravity, Urine: 1.016 (ref 1.005–1.030)
pH: 6 (ref 5.0–8.0)

## 2024-03-17 LAB — GLUCOSE, CAPILLARY
Glucose-Capillary: 85 mg/dL (ref 70–99)
Glucose-Capillary: 105 mg/dL — ABNORMAL HIGH (ref 70–99)
Glucose-Capillary: 118 mg/dL — ABNORMAL HIGH (ref 70–99)
Glucose-Capillary: 98 mg/dL (ref 70–99)

## 2024-03-17 LAB — HIV ANTIBODY (ROUTINE TESTING W REFLEX): HIV Screen 4th Generation wRfx: NONREACTIVE

## 2024-03-17 MED ORDER — VALPROATE SODIUM 100 MG/ML IV SOLN
1000.0000 mg | Freq: Once | INTRAVENOUS | Status: AC
  Administered 2024-03-17: 1000 mg via INTRAVENOUS
  Filled 2024-03-17: qty 10

## 2024-03-17 MED ORDER — POTASSIUM CHLORIDE CRYS ER 20 MEQ PO TBCR
40.0000 meq | EXTENDED_RELEASE_TABLET | Freq: Once | ORAL | Status: AC
  Administered 2024-03-17: 40 meq via ORAL
  Filled 2024-03-17: qty 2

## 2024-03-17 MED ORDER — DIHYDROERGOTAMINE MESYLATE 1 MG/ML IJ SOLN
1.0000 mg | Freq: Once | INTRAMUSCULAR | Status: AC
  Administered 2024-03-17: 1 mg via INTRAVENOUS
  Filled 2024-03-17: qty 1

## 2024-03-17 MED ORDER — MAGNESIUM SULFATE 2 GM/50ML IV SOLN
2.0000 g | Freq: Once | INTRAVENOUS | Status: AC
  Administered 2024-03-17: 2 g via INTRAVENOUS
  Filled 2024-03-17: qty 50

## 2024-03-17 MED ORDER — DIPHENHYDRAMINE HCL 50 MG/ML IJ SOLN
12.5000 mg | Freq: Once | INTRAMUSCULAR | Status: AC
  Administered 2024-03-17: 12.5 mg via INTRAVENOUS
  Filled 2024-03-17: qty 1

## 2024-03-17 MED ORDER — KETOROLAC TROMETHAMINE 30 MG/ML IJ SOLN
30.0000 mg | Freq: Four times a day (QID) | INTRAMUSCULAR | Status: AC | PRN
  Administered 2024-03-17 – 2024-03-18 (×2): 30 mg via INTRAVENOUS
  Filled 2024-03-17 (×3): qty 1

## 2024-03-17 MED ORDER — METOCLOPRAMIDE HCL 5 MG/ML IJ SOLN
10.0000 mg | Freq: Once | INTRAMUSCULAR | Status: AC
Start: 2024-03-17 — End: 2024-03-17
  Administered 2024-03-17: 10 mg via INTRAVENOUS
  Filled 2024-03-17: qty 2

## 2024-03-17 MED ORDER — ONDANSETRON HCL 4 MG/2ML IJ SOLN
4.0000 mg | Freq: Four times a day (QID) | INTRAMUSCULAR | Status: DC | PRN
Start: 2024-03-17 — End: 2024-03-19

## 2024-03-17 MED ORDER — TOPIRAMATE 100 MG PO TABS
200.0000 mg | ORAL_TABLET | Freq: Every day | ORAL | Status: DC
  Administered 2024-03-17 – 2024-03-18 (×2): 200 mg via ORAL
  Filled 2024-03-17 (×2): qty 2

## 2024-03-17 NOTE — Progress Notes (Signed)
 [facility]                                                                               Wendy Parks, is a 43 y.o. female, DOB - February 10, 1981, FMW:[id number] Admit date - 03/16/2024    Outpatient Primary MD for the patient is Jarold Medici, MD  LOS - 0  days    Brief summary    Wendy Parks is a 43 y.o. female with medical history significant of allergic rhinitis, atelectasis, amenorrhea, nipple bloody discharge, breast pain, chronic RUQ pain, leiomyoma, ovary cyst, vaginal discharge, esophageal reflux, gastritis, herpes simplex, nephrolithiasis, glucose intolerance, class II obesity, migraine headaches, hypertension, nephrolithiasis who presented to the emergency department with complaints of having a migraine headache with aura for the past 10 days associated with whole facial numbness more pronounced on the right, nausea and photophobia.    Assessment & Plan    Assessment and Plan:  Intractable migraine with aura and status migrainosus - not better this morning. 10/10.  - another course of DHE, reglan , magnesium  and toradol  ordered.  - neurology consulted and MRI brain with and without contrast ordered for further evaluation.    GERD Resume PPI.    Hypertension On metoprolol  25 mg BID.    Body mass index is 37.44 kg/m. Class II obesity  Recommend outpatient follow up with PCP for weight loss and lifestyle modifications.    Hypokalemia Replaced.    Leukocytosis Suspect reactive.  Get CXR and UA For further evaluation.   Estimated body mass index is 37.44 kg/m as calculated from the following:   Height as of this encounter: 5' 5 (1.651 m).   Weight as of this encounter: 102.1 kg.  Code Status: full code.  DVT Prophylaxis:  enoxaparin  (LOVENOX ) injection 40 mg Start: 03/16/24 2200   Level of Care: Level of care: Telemetry Family Communication: family at bedside.   Disposition Plan:     Remains inpatient appropriate:  pending  clinical  improvement.   Procedures:  MRI brain with and without contrast.  Consultants:   Neurology.   Antimicrobials:   Anti-infectives (From admission, onward)    None        Medications  Scheduled Meds:  dihydroergotamine   1 mg Intravenous Once   enoxaparin  (LOVENOX ) injection  40 mg Subcutaneous Q24H   metoCLOPramide  (REGLAN ) injection  10 mg Intravenous Once   metoprolol  tartrate  25 mg Oral BID   potassium chloride   40 mEq Oral Once   Continuous Infusions:  magnesium  sulfate bolus IVPB     promethazine  (PHENERGAN ) injection (IM or IVPB) Stopped (03/16/24 2317)   PRN Meds:.acetaminophen  **OR** acetaminophen , ketorolac , promethazine  (PHENERGAN ) injection (IM or IVPB)    Subjective:   Retal Tonkinson was seen and examined today.  Hs persistent headache with nausea.   Objective:   Vitals:   03/16/24 1553 03/16/24 2019 03/17/24 0005 03/17/24 0438  BP: (!) 144/92 (!) 154/94 131/85 122/83  Pulse: 73 65 61 (!) 59  Resp:  20 20   Temp: 97.7 F (36.5 C) 98.5 F (36.9 C) (!) 97.5 F (36.4 C) 97.8 F (36.6 C)  TempSrc:  Oral  Oral  SpO2:   100%  98%  Weight:      Height:        Intake/Output Summary (Last 24 hours) at 03/17/2024 0834 Last data filed at 03/17/2024 0728 Gross per 24 hour  Intake 148.42 ml  Output 300 ml  Net -151.58 ml   Filed Weights   03/16/24 0027  Weight: 102.1 kg     Exam General exam: Appears calm and comfortable  Respiratory system: Clear to auscultation. Respiratory effort normal. Cardiovascular system: S1 & S2 heard, RRR. Gastrointestinal system: Abdomen is nondistended, soft and nontender.  Central nervous system: Alert and oriented with facial numbness.  Extremities: Symmetric 5 x 5 power. Skin: No rashes,  Psychiatry:  Mood & affect appropriate.     Data Reviewed:  I have personally reviewed following labs and imaging studies   CBC Lab Results  Component Value Date   WBC 14.6 (H) 03/17/2024   RBC 4.83 03/17/2024   HGB  14.3 03/17/2024   HCT 44.4 03/17/2024   MCV 91.9 03/17/2024   MCH 29.6 03/17/2024   PLT 356 03/17/2024   MCHC 32.2 03/17/2024   RDW 13.3 03/17/2024   LYMPHSABS 2.8 03/16/2024   MONOABS 0.1 03/16/2024   EOSABS 0.1 03/16/2024   BASOSABS 0.0 03/16/2024     Last metabolic panel Lab Results  Component Value Date   NA 139 03/17/2024   K 3.4 (L) 03/17/2024   CL 114 (H) 03/17/2024   CO2 20 (L) 03/17/2024   BUN 14 03/17/2024   CREATININE 0.82 03/17/2024   GLUCOSE 124 (H) 03/17/2024   GFRNONAA >60 03/17/2024   GFRAA 101 07/01/2020   CALCIUM 8.2 (L) 03/17/2024   PHOS 3.0 05/12/2023   PROT 7.3 03/17/2024   ALBUMIN 3.3 (L) 03/17/2024   LABGLOB 2.8 03/04/2024   AGRATIO 1.5 08/08/2022   BILITOT 0.7 03/17/2024   ALKPHOS 79 03/17/2024   AST 15 03/17/2024   ALT 11 03/17/2024   ANIONGAP 5 03/17/2024    CBG (last 3)  Recent Labs    03/16/24 1626 03/16/24 2138 03/17/24 0731  GLUCAP 117* 106* 85      Coagulation Profile: No results for input(s): INR, PROTIME in the last 168 hours.   Radiology Studies: CT Head Wo Contrast Result Date: 03/16/2024 CLINICAL DATA:  Headache, sudden, severe EXAM: CT HEAD WITHOUT CONTRAST TECHNIQUE: Contiguous axial images were obtained from the base of the skull through the vertex without intravenous contrast. RADIATION DOSE REDUCTION: This exam was performed according to the departmental dose-optimization program which includes automated exposure control, adjustment of the mA and/or kV according to patient size and/or use of iterative reconstruction technique. COMPARISON:  None Available. FINDINGS: Brain: Normal anatomic configuration. No abnormal intra or extra-axial mass lesion or fluid collection. No abnormal mass effect or midline shift. No evidence of acute intracranial hemorrhage or infarct. Ventricular size is normal. Cerebellum unremarkable. Vascular: Unremarkable Skull: Intact Sinuses/Orbits: Paranasal sinuses are clear. Orbits are  unremarkable. Other: Mastoid air cells and middle ear cavities are clear. IMPRESSION: 1. No acute intracranial abnormality. Electronically Signed   By: Dorethia Molt M.D.   On: 03/16/2024 03:52       Elgie Butter M.D. [facility] 03/17/2024, 8:34 AM  Available via Epic secure chat 7am-7pm After 7 pm, please refer to night coverage provider listed on amion.

## 2024-03-17 NOTE — Plan of Care (Signed)
  Problem: Coping: Goal: Ability to adjust to condition or change in health will improve Outcome: Progressing   Problem: Tissue Perfusion: Goal: Adequacy of tissue perfusion will improve Outcome: Progressing   Problem: Activity: Goal: Risk for activity intolerance will decrease Outcome: Progressing

## 2024-03-17 NOTE — Consult Note (Signed)
 NEUROLOGY CONSULT NOTE   Date of service: March 17, 2024 Patient Name: Genice Kimberlin MRN:  [id number] DOB:  06-10-1981 Chief Complaint: Intractable migraine Requesting Provider: Cherlyn Labella, MD  History of Present Illness  Brenly Trawick is a 43 y.o. female with hx of allergic rhinitis, amenorrhea, bloody discharge from the nipple, breast pain, leiomyoma, ovarian cyst, GERD, gastritis, kidney stones, obesity, hypertension and migraines who presents with an intractable migraine which has been occurring for 10 days.  Patient reports that she has tried using her home medications, which included rizatriptan and Ajovy without success.  She also saw her primary care provider and was given Toradol  and Fioricet which also did not relieve the headache.  Headache is characterized as being sharp, dull and throbbing and is worse in the occipital and forehead areas bilaterally.  It is accompanied by nausea, photophobia, phonophobia and also gets worse with odors.  Patient also reports bilateral facial numbness, a metallic taste in her mouth, blurred vision and seeing zigzag lines.  She reports that this is the longest she has ever had a migraine.  She does see a headache specialist and gets regular Botox injections and nerve blocks.  Multiple headache cocktails have been tried by primary team without success.  Patient reports nausea but no other recent illnesses or symptoms.  ROS  Comprehensive ROS performed and pertinent positives documented in HPI   Past History   Past Medical History:  Diagnosis Date   Esophageal reflux 11/26/2020   Gastritis 11/26/2020   Headache    Migranes   Herpes simplex 12/25/2018   Hypertension    Kidney stone     Past Surgical History:  Procedure Laterality Date   BREAST BIOPSY Right 09/21/2023   US  RT BREAST BX W LOC DEV 1ST LESION IMG BX SPEC US  GUIDE 09/21/2023 [facility]   BREAST BIOPSY Right 11/27/2023   US  RT BREAST BX W LOC DEV 1ST LESION IMG BX SPEC US  GUIDE  11/27/2023 [facility]   BREAST BIOPSY  01/16/2024   MM RT RADIOACTIVE SEED LOC MAMMO GUIDE 01/16/2024 [facility]   BUNIONECTOMY  11/16/2020   ENDOMETRIAL ABLATION  01/2019   Dr. Henry   EXTRACORPOREAL SHOCK WAVE LITHOTRIPSY Left 07/10/2023   Procedure: LEFT EXTRACORPOREAL SHOCK WAVE LITHOTRIPSY (ESWL);  Surgeon: Cam Morene ORN, MD;  Location: [facility];  Service: Urology;  Laterality: Left;   KIDNEY STONE SURGERY     RADIOACTIVE SEED GUIDED AXILLARY SENTINEL LYMPH NODE Right 01/17/2024   Procedure: RADIOACTIVE SEED GUIDED EXCISIONAL BREAST BIOPSY;  Surgeon: Belinda Cough, MD;  Location: [facility];  Service: General;  Laterality: Right;  LMA RIGHT BREAST RADIOACTIVE SEED LOCALIZED EXCISIONAL BIOPSY   TUBAL LIGATION  2012    Family History: Family History  Problem Relation Age of Onset   Healthy Mother    Dementia Father    Cancer Father     Social History  reports that she has never smoked. She has never used smokeless tobacco. She reports current alcohol use. She reports that she does not use drugs.  No Known Allergies  Medications   Current Facility-Administered Medications:    acetaminophen  (TYLENOL ) tablet 650 mg, 650 mg, Oral, Q6H PRN, 650 mg at 03/16/24 2254 **OR** acetaminophen  (TYLENOL ) suppository 650 mg, 650 mg, Rectal, Q6H PRN, Celinda Alm Lot, MD   enoxaparin  (LOVENOX ) injection 40 mg, 40 mg, Subcutaneous, Q24H, Celinda Alm Lot, MD, 40 mg at 03/16/24 2254   ketorolac  (TORADOL ) 30 MG/ML injection 30 mg, 30 mg,  Intravenous, Q6H PRN, Celinda Alm Lot, MD, 30 mg at 03/17/24 9345   magnesium  sulfate IVPB 2 g 50 mL, 2 g, Intravenous, Once, Cherlyn Labella, MD, Last Rate: 50 mL/hr at 03/17/24 0919, 2 g at 03/17/24 0919   metoprolol  tartrate (LOPRESSOR ) tablet 25 mg, 25 mg, Oral, BID, Celinda Alm Lot, MD, 25 mg at 04-11-24 2254   ondansetron  (ZOFRAN ) injection 4 mg, 4 mg, Intravenous, Q6H PRN, de Clint Kill, Cortney  E, NP   promethazine  (PHENERGAN ) 25 mg in sodium chloride  0.9 % 50 mL IVPB, 25 mg, Intravenous, Q6H PRN, Raford Alm, MD, Stopped at 2024-04-11 2317  Vitals   Vitals:   April 11, 2024 2019 03/17/24 0005 03/17/24 0438 03/17/24 0913  BP: (!) 154/94 131/85 122/83   Pulse: 65 61 (!) 59 (!) 54  Resp: 20 20    Temp: 98.5 F (36.9 C) (!) 97.5 F (36.4 C) 97.8 F (36.6 C)   TempSrc: Oral  Oral   SpO2:  100% 98%   Weight:      Height:        Body mass index is 37.44 kg/m.   Physical Exam   Constitutional: Appears well-developed and well-nourished.  Psych: Affect appropriate to situation.  Eyes: No scleral injection.  HENT: No OP obstruction.  Head: Normocephalic.  Respiratory: Effort normal, non-labored breathing.  Skin: WDI.   Neurologic Examination    NEURO:  Mental Status: AA&Ox3, able to give clear and coherent history of present illness Speech/Language: speech is without dysarthria or aphasia.   Cranial Nerves:  II: PERRL.  Patient reports some blurred vision and seeing zigzag lines III, IV, VI: EOMI. Eyelids elevate symmetrically.  V: Sensation is intact to light touch and symmetrical to face.  VII: Smile is symmetrical.  VIII: hearing intact to voice. IX, X: Phonation is normal.  KP:Dynloizm shrug 5/5. XII: tongue is midline without fasciculations. Motor: Able to move all 4 extremities with good antigravity strength Tone: is normal and bulk is normal Sensation- Intact to light touch bilaterally.  Gait- deferred   Labs/Imaging/Neurodiagnostic studies   CBC:  Recent Labs  Lab 2024/04/11 0726 03/17/24 0516  WBC 10.2 14.6*  NEUTROABS 7.2  --   HGB 14.5 14.3  HCT 44.2 44.4  MCV 90.4 91.9  PLT 350 356   Basic Metabolic Panel:  Lab Results  Component Value Date   NA 139 03/17/2024   K 3.4 (L) 03/17/2024   CO2 20 (L) 03/17/2024   GLUCOSE 124 (H) 03/17/2024   BUN 14 03/17/2024   CREATININE 0.82 03/17/2024   CALCIUM 8.2 (L) 03/17/2024   GFRNONAA >60  03/17/2024   GFRAA 101 07/01/2020   Lipid Panel:  Lab Results  Component Value Date   LDLCALC 88 09/04/2023   HgbA1c:  Lab Results  Component Value Date   HGBA1C 6.2 (H) 03/04/2024   MRI Brain with and without contrast: Pending  ASSESSMENT   Kryslyn Helbig is a 43 y.o. female with hx of allergic rhinitis, amenorrhea, bloody discharge from the nipple, breast pain, leiomyoma, ovarian cyst, GERD, gastritis, kidney stones, obesity, hypertension and migraines who presents with a 10-day history of intractable migraine.  She has tried her home migraine rescue medications and received Toradol  and Fioricet from her primary care provider without relief.  Migraine is sharp, dull and throbbing in character and is worse at the occiput and forehead area.  It is accompanied by nausea, photophobia, phonophobia, metallic taste in the mouth, facial numbness bilaterally as well as seeing zigzag lines and blurred  vision.  Multiple headache cocktails with DHE and magnesium  have been attempted and did not appear to be providing relief.  Will need MRI brain with and without contrast to determine if structural cause of migraine can be identified.  Will try 1000 mg of Depakote IV to see if this provides relief.  Otherwise, would consider occipital nerve block, which can be done with outpatient headache specialist or [facility].  RECOMMENDATIONS  -MRI brain with and without contrast -Depakote 1000 mg IV x 1 - Continue headache cocktails with Toradol , Reglan  and Phenergan , add Zofran  for additional nausea relief - Will consider occipital nerve block pending MRI results, this may be able to be done on an outpatient basis at [facility] ______________________________________________________________________  Patient seen by NP and then by MD, MD to edit note as needed.  Signed, Cortney E Everitt Clint Kill, NP [facility]   I have seen the patient reviewed the above note.   She has significant drusen, but I do not see any blurring of the disc margin.  Given how much more severe and different in location this is than her typical migraines, I agree with further imaging with MRI/MRV.  She has tried the common rescue medications, but given that Joen reports some benefit from Compazine , I think this class may be useful for her and droperidol  can sometimes be effective.  I will check an EKG, and if there is no signs of QT prolongation, we will attempt droperidol  x 1.  I discussed with the patient that this is likely going to need to be treated predominantly as an outpatient, and that it may not improve quickly.  Aisha Seals, MD [facility]  If 7pm- 7am, please page neurology on call as listed in AMION.

## 2024-03-18 ENCOUNTER — Inpatient Hospital Stay (HOSPITAL_COMMUNITY)

## 2024-03-18 DIAGNOSIS — G43511 Persistent migraine aura without cerebral infarction, intractable, with status migrainosus: Secondary | ICD-10-CM

## 2024-03-18 DIAGNOSIS — G43919 Migraine, unspecified, intractable, without status migrainosus: Secondary | ICD-10-CM | POA: Diagnosis not present

## 2024-03-18 LAB — BASIC METABOLIC PANEL WITH GFR
Anion gap: 6 (ref 5–15)
BUN: 19 mg/dL (ref 6–20)
CO2: 23 mmol/L (ref 22–32)
Calcium: 8.9 mg/dL (ref 8.9–10.3)
Chloride: 108 mmol/L (ref 98–111)
Creatinine, Ser: 0.96 mg/dL (ref 0.44–1.00)
GFR, Estimated: >60 mL/min (ref >60)
Glucose, Bld: 83 mg/dL (ref 70–99)
Potassium: 3.5 mmol/L (ref 3.5–5.1)
Sodium: 137 mmol/L (ref 135–145)

## 2024-03-18 LAB — GLUCOSE, CAPILLARY
Glucose-Capillary: 93 mg/dL (ref 70–99)
Glucose-Capillary: 71 mg/dL (ref 70–99)
Glucose-Capillary: 52 mg/dL — ABNORMAL LOW (ref 70–99)
Glucose-Capillary: 61 mg/dL — ABNORMAL LOW (ref 70–99)
Glucose-Capillary: 73 mg/dL (ref 70–99)
Glucose-Capillary: 69 mg/dL — ABNORMAL LOW (ref 70–99)
Glucose-Capillary: 112 mg/dL — ABNORMAL HIGH (ref 70–99)

## 2024-03-18 MED ORDER — METOCLOPRAMIDE HCL 5 MG/ML IJ SOLN
10.0000 mg | Freq: Three times a day (TID) | INTRAMUSCULAR | Status: DC
  Administered 2024-03-18 – 2024-03-19 (×2): 10 mg via INTRAVENOUS
  Filled 2024-03-18 (×2): qty 2

## 2024-03-18 MED ORDER — GADOBUTROL 1 MMOL/ML IV SOLN
7.0000 mL | Freq: Once | INTRAVENOUS | Status: AC | PRN
  Administered 2024-03-18: 7 mL via INTRAVENOUS

## 2024-03-18 MED ORDER — GUAIFENESIN ER 600 MG PO TB12
600.0000 mg | ORAL_TABLET | Freq: Two times a day (BID) | ORAL | Status: DC
  Administered 2024-03-18 – 2024-03-19 (×3): 600 mg via ORAL
  Filled 2024-03-18 (×3): qty 1

## 2024-03-18 MED ORDER — KETOROLAC TROMETHAMINE 30 MG/ML IJ SOLN
30.0000 mg | Freq: Once | INTRAMUSCULAR | Status: AC
  Administered 2024-03-18: 30 mg via INTRAVENOUS

## 2024-03-18 MED ORDER — DEXAMETHASONE SODIUM PHOSPHATE 10 MG/ML IJ SOLN
8.0000 mg | Freq: Once | INTRAMUSCULAR | Status: AC
  Administered 2024-03-18: 8 mg via INTRAVENOUS
  Filled 2024-03-18: qty 1

## 2024-03-18 MED ORDER — METOCLOPRAMIDE HCL 5 MG/ML IJ SOLN
10.0000 mg | Freq: Once | INTRAMUSCULAR | Status: AC
  Administered 2024-03-18: 10 mg via INTRAVENOUS
  Filled 2024-03-18: qty 2

## 2024-03-18 MED ORDER — HYDROMORPHONE HCL 1 MG/ML IJ SOLN
1.0000 mg | INTRAMUSCULAR | Status: DC | PRN
  Administered 2024-03-18 – 2024-03-19 (×3): 1 mg via INTRAVENOUS
  Filled 2024-03-18 (×3): qty 1

## 2024-03-18 MED ORDER — FLUTICASONE PROPIONATE 50 MCG/ACT NA SUSP
1.0000 | Freq: Every day | NASAL | Status: DC
  Administered 2024-03-18 – 2024-03-19 (×2): 1 via NASAL
  Filled 2024-03-18: qty 16

## 2024-03-18 MED ORDER — POTASSIUM CHLORIDE 20 MEQ PO PACK: 40.0000 meq | PACK | Freq: Once | ORAL | Status: DC

## 2024-03-18 MED ORDER — DROPERIDOL 2.5 MG/ML IJ SOLN
2.5000 mg | Freq: Once | INTRAMUSCULAR | Status: AC
  Administered 2024-03-18: 2.5 mg via INTRAVENOUS
  Filled 2024-03-18: qty 2

## 2024-03-18 MED ORDER — AMLODIPINE BESYLATE 5 MG PO TABS
5.0000 mg | ORAL_TABLET | Freq: Every day | ORAL | Status: DC
  Administered 2024-03-18 – 2024-03-19 (×2): 5 mg via ORAL
  Filled 2024-03-18 (×2): qty 1

## 2024-03-18 MED ORDER — DEXTROSE 50 % IV SOLN: 1.0000 | Freq: Once | INTRAVENOUS | Status: DC

## 2024-03-18 MED ORDER — DOCUSATE SODIUM 100 MG PO CAPS
100.0000 mg | ORAL_CAPSULE | Freq: Two times a day (BID) | ORAL | Status: DC
  Administered 2024-03-18 – 2024-03-19 (×3): 100 mg via ORAL
  Filled 2024-03-18 (×3): qty 1

## 2024-03-18 NOTE — Plan of Care (Signed)
  Problem: Education: Goal: Ability to describe self-care measures that may prevent or decrease complications (Diabetes Survival Skills Education) will improve 03/18/2024 1856 by Joesph Morna SQUIBB, RN Outcome: Progressing 03/18/2024 1856 by Joesph Morna SQUIBB, RN Outcome: Progressing Goal: Individualized Educational Video(s) 03/18/2024 1856 by Joesph Morna SQUIBB, RN Outcome: Progressing 03/18/2024 1856 by Joesph Morna SQUIBB, RN Outcome: Progressing   Problem: Coping: Goal: Ability to adjust to condition or change in health will improve 03/18/2024 1856 by Joesph Morna SQUIBB, RN Outcome: Progressing 03/18/2024 1856 by Joesph Morna SQUIBB, RN Outcome: Progressing   Problem: Fluid Volume: Goal: Ability to maintain a balanced intake and output will improve 03/18/2024 1856 by Joesph Morna SQUIBB, RN Outcome: Progressing 03/18/2024 1856 by Joesph Morna SQUIBB, RN Outcome: Progressing   Problem: Health Behavior/Discharge Planning: Goal: Ability to identify and utilize available resources and services will improve Outcome: Progressing Goal: Ability to manage health-related needs will improve Outcome: Progressing   Problem: Metabolic: Goal: Ability to maintain appropriate glucose levels will improve Outcome: Progressing   Problem: Nutritional: Goal: Maintenance of adequate nutrition will improve Outcome: Progressing Goal: Progress toward achieving an optimal weight will improve Outcome: Progressing   Problem: Skin Integrity: Goal: Risk for impaired skin integrity will decrease Outcome: Progressing   Problem: Tissue Perfusion: Goal: Adequacy of tissue perfusion will improve Outcome: Progressing   Problem: Education: Goal: Knowledge of General Education information will improve Description: Including pain rating scale, medication(s)/side effects and non-pharmacologic comfort measures Outcome: Progressing   Problem: Health Behavior/Discharge Planning: Goal: Ability to manage health-related  needs will improve Outcome: Progressing   Problem: Clinical Measurements: Goal: Ability to maintain clinical measurements within normal limits will improve Outcome: Progressing Goal: Will remain free from infection Outcome: Progressing Goal: Diagnostic test results will improve Outcome: Progressing Goal: Respiratory complications will improve Outcome: Progressing Goal: Cardiovascular complication will be avoided Outcome: Progressing   Problem: Activity: Goal: Risk for activity intolerance will decrease Outcome: Progressing   Problem: Nutrition: Goal: Adequate nutrition will be maintained Outcome: Progressing   Problem: Coping: Goal: Level of anxiety will decrease Outcome: Progressing   Problem: Elimination: Goal: Will not experience complications related to bowel motility Outcome: Progressing Goal: Will not experience complications related to urinary retention Outcome: Progressing   Problem: Pain Managment: Goal: General experience of comfort will improve and/or be controlled Outcome: Progressing   Problem: Safety: Goal: Ability to remain free from injury will improve Outcome: Progressing   Problem: Skin Integrity: Goal: Risk for impaired skin integrity will decrease Outcome: Progressing

## 2024-03-18 NOTE — Progress Notes (Signed)
   03/18/24 1326  TOC Brief Assessment  Insurance and Status Reviewed  Patient has primary care physician Yes  Home environment has been reviewed single family home  Prior level of function: independent  Prior/Current Home Services No current home services  Social Drivers of Health Review SDOH reviewed no interventions necessary  Readmission risk has been reviewed Yes  Transition of care needs no transition of care needs at this time    Heather Saltness, MSW, LCSW Clinical Social Worker Inpatient Care Management 03/18/2024 1:26 PM

## 2024-03-18 NOTE — Plan of Care (Signed)
  Problem: Coping: Goal: Ability to adjust to condition or change in health will improve Outcome: Progressing   Problem: Health Behavior/Discharge Planning: Goal: Ability to manage health-related needs will improve Outcome: Progressing   Problem: Nutritional: Goal: Maintenance of adequate nutrition will improve Outcome: Progressing   Problem: Clinical Measurements: Goal: Diagnostic test results will improve Outcome: Progressing   Problem: Activity: Goal: Risk for activity intolerance will decrease Outcome: Progressing

## 2024-03-18 NOTE — Progress Notes (Signed)
 [facility]  PROGRESS NOTE  Wendy Parks FMW:[id number] DOB: [birth year] DOA: 03/16/2024 PCP: Wendy Medici, MD   Brief HPI:   43 y.o. female with medical history significant of allergic rhinitis, atelectasis, amenorrhea, nipple bloody discharge, breast pain, chronic RUQ pain, leiomyoma, ovary cyst, vaginal discharge, esophageal reflux, gastritis, herpes simplex, nephrolithiasis, glucose intolerance, class II obesity, migraine headaches, hypertension, nephrolithiasis who presented to the emergency department with complaints of having a migraine headache with aura for the past 10 days associated with whole facial numbness more pronounced on the right, nausea and photophobia.       Assessment/Plan:   Intractable migraine with aura and status migrainosus -Continues to have frontal headache -No improvement with DHE, Reglan , magnesium  and Toradol  -Neurology consulted, ordered 1 dose of 1 g IV Depakote with no improvement; patient said that she had weird dreams with Depakote -MRI brain, MR venogram head was unremarkable  ?  Sinus headache -Patient does have allergies and has tenderness noted at frontal sinuses -Will start Mucinex  600 mg p.o. twice daily, Flonase  1 spray twice daily  GERD Resume PPI.     Hypertension On metoprolol  25 mg BID.      Body mass index is 37.44 kg/m. Class II obesity  Recommend outpatient follow up with PCP for weight loss and lifestyle modifications.      Hypokalemia Replaced, will check BMP today     Leukocytosis Suspect reactive.  UA is clear -Chest x-ray did not show infiltrate      Medications     docusate sodium   100 mg Oral BID   enoxaparin  (LOVENOX ) injection  40 mg Subcutaneous Q24H   fluticasone   1 spray Each Nare Daily   guaiFENesin   600 mg Oral BID   metoprolol  tartrate  25 mg Oral BID   topiramate   200 mg Oral QHS     Data Reviewed:   CBG:  Recent Labs  Lab 03/17/24 1145 03/17/24 1623 03/17/24 2038 03/18/24 0727  03/18/24 1152  GLUCAP 105* 118* 98 93 71    SpO2: 100 %    Vitals:   03/17/24 1147 03/17/24 2035 03/18/24 0444 03/18/24 1259  BP: 126/82 139/83 128/82 136/80  Pulse: 61 60 62 70  Resp: 16 17 17 15   Temp: 97.7 F (36.5 C) 98.4 F (36.9 C) 98.4 F (36.9 C) 98.2 F (36.8 C)  TempSrc: Oral Oral Oral   SpO2: 100% 100% 100% 100%  Weight:      Height:          Data Reviewed:  Basic Metabolic Panel: Recent Labs  Lab 03/16/24 0726 03/17/24 0516  NA 137 139  K 3.7 3.4*  CL 111 114*  CO2 18* 20*  GLUCOSE 133* 124*  BUN 17 14  CREATININE 0.90 0.82  CALCIUM 8.7* 8.2*    CBC: Recent Labs  Lab 03/16/24 0726 03/17/24 0516  WBC 10.2 14.6*  NEUTROABS 7.2  --   HGB 14.5 14.3  HCT 44.2 44.4  MCV 90.4 91.9  PLT 350 356    LFT Recent Labs  Lab 03/17/24 0516  AST 15  ALT 11  ALKPHOS 79  BILITOT 0.7  PROT 7.3  ALBUMIN 3.3*     Antibiotics: Anti-infectives (From admission, onward)    None        DVT prophylaxis: Lovenox   Code Status: Full code  Family Communication: No family at bedside   CONSULTS neurology   Subjective   Still complains of headache, mainly in frontal region   Objective  Physical Examination:   General-appears in no acute distress Heart-S1-S2, regular, no murmur auscultated HEENT-tenderness noted at the bridge of nose, frontal sinuses Lungs-clear to auscultation bilaterally, no wheezing or crackles auscultated Abdomen-soft, nontender, no organomegaly Extremities-no edema in the lower extremities Neuro-alert, oriented x3, no focal deficit noted  Status is: Inpatient:             Wendy Parks   [facility] If 7PM-7AM, please contact night-coverage at [email or web address], Office  [phone]   03/18/2024, 2:24 PM  LOS: 1 day

## 2024-03-19 DIAGNOSIS — E66812 Obesity, class 2: Secondary | ICD-10-CM | POA: Diagnosis not present

## 2024-03-19 DIAGNOSIS — E6609 Other obesity due to excess calories: Secondary | ICD-10-CM | POA: Diagnosis not present

## 2024-03-19 DIAGNOSIS — Z8679 Personal history of other diseases of the circulatory system: Secondary | ICD-10-CM | POA: Diagnosis not present

## 2024-03-19 DIAGNOSIS — G43111 Migraine with aura, intractable, with status migrainosus: Secondary | ICD-10-CM | POA: Diagnosis not present

## 2024-03-19 LAB — GLUCOSE, CAPILLARY
Glucose-Capillary: 127 mg/dL — ABNORMAL HIGH (ref 70–99)
Glucose-Capillary: 92 mg/dL (ref 70–99)

## 2024-03-19 MED ORDER — OXYCODONE HCL 5 MG PO TABS: 5.0000 mg | ORAL_TABLET | Freq: Three times a day (TID) | ORAL | 0 refills | Status: DC | PRN

## 2024-03-19 MED ORDER — PREDNISONE 10 MG PO TABS: ORAL_TABLET | ORAL | 0 refills | Status: DC

## 2024-03-19 MED ORDER — GUAIFENESIN ER 600 MG PO TB12: 600.0000 mg | ORAL_TABLET | Freq: Two times a day (BID) | ORAL | 0 refills | Status: AC

## 2024-03-19 MED ORDER — FLUTICASONE PROPIONATE 50 MCG/ACT NA SUSP: 1.0000 | Freq: Every day | NASAL | 0 refills | Status: AC

## 2024-03-19 MED ORDER — TOPIRAMATE 200 MG PO TABS: 200.0000 mg | ORAL_TABLET | Freq: Every day | ORAL | 1 refills | Status: DC

## 2024-03-19 NOTE — Discharge Summary (Signed)
 Physician Discharge Summary   Patient: Wendy Parks MRN: [id number] DOB: 1980/10/02  Admit date:     03/16/2024  Discharge date: 03/19/24  Discharge Physician: [name]   PCP: Jarold Medici, MD   Recommendations at discharge:   Follow-up pain management/headache specialist as outpatient  Discharge Diagnoses: Principal Problem:   Intractable migraine with aura with status migrainosus Active Problems:   Esophageal reflux   History of hypertension   Class 2 obesity due to excess calories with body mass index (BMI) of 37.0 to 37.9 in adult   Prediabetes   Hypocalcemia  Resolved Problems:   * No resolved hospital problems. *  Hospital Course: 43 y.o. female with medical history significant of allergic rhinitis, atelectasis, amenorrhea, nipple bloody discharge, breast pain, chronic RUQ pain, leiomyoma, ovary cyst, vaginal discharge, esophageal reflux, gastritis, herpes simplex, nephrolithiasis, glucose intolerance, class II obesity, migraine headaches, hypertension, nephrolithiasis who presented to the emergency department with complaints of having a migraine headache with aura for the past 10 days associated with whole facial numbness more pronounced on the right, nausea and photophobia.     Assessment and Plan:  Intractable migraine with aura and status migrainosus - Improved ; will discharge home and follow-up with headache specialist as outpatient. - Initially had no improvement with  DHE, Reglan , magnesium  and Toradol  -Neurology consulted, ordered 1 dose of 1 g IV Depakote with no improvement; patient said that she had weird dreams with Depakote -Discussed with neuro, 1 dose of Decadron  10 mg IV given.  Will discharge on prednisone  taper for next 5 days. -MRI brain, MR venogram head was unremarkable -Will discharge on Topamax  200 mg p.o. nightly -Follow-up Dr. Oneita, headache specialist as outpatient.   ?  Sinus headache -Patient does have allergies and has tenderness  noted at frontal sinuses - Started on Mucinex  600 mg p.o. twice daily, Flonase  1 spray twice daily -Will discharge on Mucinex , Flonase       Hypertension On amlodipine  5 mg daily     Body mass index is 37.44 kg/m. Class II obesity  Recommend outpatient follow up with PCP for weight loss and lifestyle modifications.      Hypokalemia Replete     Leukocytosis Suspect reactive.  UA is clear -Chest x-ray did not show infiltrate            Consultants: Neurology Procedures performed:   Disposition: Home Diet recommendation:  Discharge Diet Orders (From admission, onward)     Start     Ordered   03/19/24 0000  Diet - low sodium heart healthy        03/19/24 1332           Regular diet DISCHARGE MEDICATION: Allergies as of 03/19/2024   No Known Allergies      Medication List     TAKE these medications    Ajovy 225 MG/1.5ML Soaj Generic drug: Fremanezumab-vfrm Inject 1.5 mLs into the skin every 30 (thirty) days.   albuterol  108 (90 Base) MCG/ACT inhaler Commonly known as: VENTOLIN  HFA TAKE 2 PUFFS BY MOUTH EVERY 6 HOURS AS NEEDED FOR WHEEZE OR SHORTNESS OF BREATH   amLODipine  2.5 MG tablet Commonly known as: NORVASC  Take 2 tabs po daily   baclofen 10 MG tablet Commonly known as: LIORESAL Take 10 mg by mouth 2 (two) times daily as needed.   Botox 100 units Solr injection Generic drug: botulinum toxin Type A Inject 100 Units into the muscle every 3 (three) months.   butalbital -acetaminophen -caffeine  50-325-40 MG tablet Commonly  known as: FIORICET Take 1-2 tablets by mouth every 12 (twelve) hours as needed for headache.   cholecalciferol 25 MCG (1000 UNIT) tablet Commonly known as: VITAMIN D3 Take 1,000 Units by mouth daily.   citalopram  10 MG tablet Commonly known as: CELEXA  Take 1 tablet (10 mg total) by mouth daily.   fluticasone  50 MCG/ACT nasal spray Commonly known as: FLONASE  Place 1 spray into both nostrils daily. Start taking on:  March 20, 2024   guaiFENesin  600 MG 12 hr tablet Commonly known as: MUCINEX  Take 1 tablet (600 mg total) by mouth 2 (two) times daily for 5 days.   metFORMIN  500 MG 24 hr tablet Commonly known as: GLUCOPHAGE -XR Take 1 tablet (500 mg total) by mouth daily with breakfast.   ondansetron  4 MG tablet Commonly known as: Zofran  Take 1 tablet (4 mg total) by mouth every 8 (eight) hours as needed for nausea or vomiting.   oxyCODONE  5 MG immediate release tablet Commonly known as: Roxicodone  Take 1 tablet (5 mg total) by mouth every 8 (eight) hours as needed.   phentermine  37.5 MG tablet Commonly known as: ADIPEX-P  Take 1 tablet (37.5 mg total) by mouth daily before breakfast.   predniSONE  10 MG tablet Commonly known as: DELTASONE  Prednisone  40 mg po daily x 1 day then Prednisone  30 mg po daily x 1 day then Prednisone  20 mg po daily x 1 day then Prednisone  10 mg daily x 1 day then stop...   rizatriptan 10 MG tablet Commonly known as: MAXALT Take 5 mg by mouth as needed for migraine.   topiramate  200 MG tablet Commonly known as: TOPAMAX  Take 1 tablet (200 mg total) by mouth at bedtime.        Discharge Exam: Filed Weights   03/16/24 0027  Weight: 102.1 kg   General-appears in no acute distress Heart-S1-S2, regular, no murmur auscultated Lungs-clear to auscultation bilaterally, no wheezing or crackles auscultated Abdomen-soft, nontender, no organomegaly Extremities-no edema in the lower extremities Neuro-alert, oriented x3, no focal deficit noted  Condition at discharge: good  The results of significant diagnostics from this hospitalization (including imaging, microbiology, ancillary and laboratory) are listed below for reference.   Imaging Studies: MR Venogram Head ADDENDUM: Dedicated MR venogram images demonstrate moderate narrowing of the transverse sinus bilaterally the lateral aspect of the transverse sinus is within normal limits bilaterally. Sigmoid Sinus and  internal jugular sinus is normal bilaterally. The right transverse sinus is dominant.Electronically signed by: Lonni Necessary MD 03/18/2024 08:33 AM EDT RP Workstation: [id number]   Result Date: 03/18/2024  EXAM: MRI BRAIN WITHOUT CONTRAST 03/18/2024 06:44:02 AM TECHNIQUE: Multiplanar multisequence MRI of the head/brain was performed without the administration of intravenous contrast. COMPARISON: None available. CLINICAL HISTORY: Headache, sudden, severe. Rule out meningitis and pseudotumor cerebri. Chronic migraine headaches for 10 days. FINDINGS: BRAIN AND VENTRICLES: No acute infarct. No acute intracranial hemorrhage. No mass or abnormal enhancement. No midline shift. No hydrocephalus. The sella is unremarkable. Normal flow voids. ORBITS: No acute abnormality. SINUSES AND MASTOIDS: No acute abnormality. BONES AND SOFT TISSUES: Normal bone marrow signal. No acute soft tissue abnormality. IMPRESSION: 1. No acute intracranial abnormality. 2. No mass or abnormal enhancement. Electronically signed by: Lonni Necessary MD 03/18/2024 08:16 AM EDT RP Workstation: [id number]   MR BRAIN W WO CONTRAST Result Date: 03/18/2024 EXAM: MRI BRAIN WITH AND WITHOUT CONTRAST 03/18/2024 06:44:02 AM TECHNIQUE: Multiplanar multisequence MRI of the head/brain was performed with and without the administration of intravenous contrast. COMPARISON: MR head without contrast 09/05/2021 and  CT head without contrast 03/16/2024. CLINICAL HISTORY: Intractable migraine headaches for 10 days. Rule out meningitis and pseudotumor cerebri. FINDINGS: BRAIN AND VENTRICLES: No acute infarct. No acute intracranial hemorrhage. No mass or abnormal enhancement. No midline shift. No hydrocephalus. The sella is unremarkable. Normal flow voids. 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 ORBITS: No acute abnormality. SINUSES AND MASTOIDS: No acute abnormality. BONES AND SOFT TISSUES: Normal bone marrow  signal. No acute soft tissue abnormality. IMPRESSION: 1. No acute intracranial abnormality. 2. No mass or abnormal enhancement. 3. No evidence of meningitis or pseudotumor cerebri. Electronically signed by: Lonni Necessary MD 03/18/2024 08:27 AM EDT RP Workstation: [id number]   DG CHEST PORT 1 VIEW Result Date: 03/17/2024 CLINICAL DATA:  Migraine headache EXAM: PORTABLE CHEST 1 VIEW COMPARISON:  None Available. FINDINGS: Low lung volumes are present, causing crowding of the pulmonary vasculature. Mild enlargement of the cardiopericardial silhouette, without edema. Accounting for the low lung volumes, the lungs appear clear. No blunting of the costophrenic angles. No significant bony findings. IMPRESSION: 1. Low lung volumes, causing crowding of the pulmonary vasculature. 2. Mild enlargement of the cardiopericardial silhouette, without edema. Electronically Signed   By: Ryan Salvage M.D.   On: 03/17/2024 12:42   CT Head Wo Contrast Result Date: 03/16/2024 CLINICAL DATA:  Headache, sudden, severe EXAM: CT HEAD WITHOUT CONTRAST TECHNIQUE: Contiguous axial images were obtained from the base of the skull through the vertex without intravenous contrast. RADIATION DOSE REDUCTION: This exam was performed according to the departmental dose-optimization program which includes automated exposure control, adjustment of the mA and/or kV according to patient size and/or use of iterative reconstruction technique. COMPARISON:  None Available. FINDINGS: Brain: Normal anatomic configuration. No abnormal intra or extra-axial mass lesion or fluid collection. No abnormal mass effect or midline shift. No evidence of acute intracranial hemorrhage or infarct. Ventricular size is normal. Cerebellum unremarkable. Vascular: Unremarkable Skull: Intact Sinuses/Orbits: Paranasal sinuses are clear. Orbits are unremarkable. Other: Mastoid air cells and middle ear cavities are clear. IMPRESSION: 1. No acute intracranial abnormality.  Electronically Signed   By: Dorethia Molt M.D.   On: 03/16/2024 03:52    Microbiology: Results for orders placed or performed in visit on 04/26/23  Microscopic Examination     Status: Abnormal   Collection Time: 04/26/23  1:53 PM   Urine  Result Value Ref Range Status   WBC, UA >30 (A) 0 - 5 /hpf Final   RBC, Urine >30 (A) 0 - 2 /hpf Final   Epithelial Cells (non renal) >10 (A) 0 - 10 /hpf Final   Mucus, UA Present (A) Not Estab. Final   Bacteria, UA Many (A) None seen/Few Final    Labs: CBC: Recent Labs  Lab 03/16/24 0726 03/17/24 0516  WBC 10.2 14.6*  NEUTROABS 7.2  --   HGB 14.5 14.3  HCT 44.2 44.4  MCV 90.4 91.9  PLT 350 356   Basic Metabolic Panel: Recent Labs  Lab 03/16/24 0726 03/17/24 0516 03/18/24 1437  NA 137 139 137  K 3.7 3.4* 3.5  CL 111 114* 108  CO2 18* 20* 23  GLUCOSE 133* 124* 83  BUN 17 14 19   CREATININE 0.90 0.82 0.96  CALCIUM 8.7* 8.2* 8.9   Liver Function Tests: Recent Labs  Lab 03/17/24 0516  AST 15  ALT 11  ALKPHOS 79  BILITOT 0.7  PROT 7.3  ALBUMIN 3.3*   CBG: Recent Labs  Lab 03/18/24 1750 03/18/24 2050 03/18/24  2139 03/19/24 0727 03/19/24 1135  GLUCAP 73 69* 112* 127* 92    Discharge time spent: greater than 30 minutes.  Signed: [name], MD [facility] 03/19/2024

## 2024-03-19 NOTE — Discharge Instructions (Signed)
 MR venogram showed narrowing of transverse sinus, neurology recommends that patient should follow-up with neurosurgeon at [facility].  Patient to discuss with her headache specialist as well as PCP to get referral for neurosurgery at [facility].

## 2024-03-19 NOTE — Progress Notes (Incomplete)
 [facility]  PROGRESS NOTE  Wendy Parks FMW:[id number] DOB: 04-Oct-1980 DOA: 03/16/2024 PCP: Jarold Medici, MD   Brief HPI:   43 y.o. female with medical history significant of allergic rhinitis, atelectasis, amenorrhea, nipple bloody discharge, breast pain, chronic RUQ pain, leiomyoma, ovary cyst, vaginal discharge, esophageal reflux, gastritis, herpes simplex, nephrolithiasis, glucose intolerance, class II obesity, migraine headaches, hypertension, nephrolithiasis who presented to the emergency department with complaints of having a migraine headache with aura for the past 10 days associated with whole facial numbness more pronounced on the right, nausea and photophobia.       Assessment/Plan:   Intractable migraine with aura and status migrainosus -Continues to have frontal headache -No improvement with DHE, Reglan , magnesium  and Toradol  -Neurology consulted, ordered 1 dose of 1 g IV Depakote with no improvement; patient said that she had weird dreams with Depakote -MRI brain, MR venogram head was unremarkable  ?  Sinus headache -Patient does have allergies and has tenderness noted at frontal sinuses -Will start Mucinex  600 mg p.o. twice daily, Flonase  1 spray twice daily  GERD Resume PPI.     Hypertension On metoprolol  25 mg BID.      Body mass index is 37.44 kg/m. Class II obesity  Recommend outpatient follow up with PCP for weight loss and lifestyle modifications.      Hypokalemia Replaced, will check BMP today     Leukocytosis Suspect reactive.  UA is clear -Chest x-ray did not show infiltrate      Medications     amLODipine   5 mg Oral Daily   dextrose   1 ampule Intravenous Once   docusate sodium   100 mg Oral BID   enoxaparin  (LOVENOX ) injection  40 mg Subcutaneous Q24H   fluticasone   1 spray Each Nare Daily   guaiFENesin   600 mg Oral BID   metoCLOPramide  (REGLAN ) injection  10 mg Intravenous Q8H   topiramate   200 mg Oral QHS     Data  Reviewed:   CBG:  Recent Labs  Lab 03/18/24 1657 03/18/24 1750 03/18/24 2050 03/18/24 2139 03/19/24 0727  GLUCAP 52* 73 69* 112* 127*    SpO2: 97 %    Vitals:   03/18/24 1259 03/18/24 1934 03/18/24 1935 03/19/24 0537  BP: 136/80 (!) 152/98 (!) 148/83 (!) 146/84  Pulse: 70 84 81 88  Resp: 15 18  18   Temp: 98.2 F (36.8 C) 98 F (36.7 C)  98 F (36.7 C)  TempSrc:  Oral  Oral  SpO2: 100% 98%  97%  Weight:      Height:          Data Reviewed:  Basic Metabolic Panel: Recent Labs  Lab 03/16/24 0726 03/17/24 0516 03/18/24 1437  NA 137 139 137  K 3.7 3.4* 3.5  CL 111 114* 108  CO2 18* 20* 23  GLUCOSE 133* 124* 83  BUN 17 14 19   CREATININE 0.90 0.82 0.96  CALCIUM 8.7* 8.2* 8.9    CBC: Recent Labs  Lab 03/16/24 0726 03/17/24 0516  WBC 10.2 14.6*  NEUTROABS 7.2  --   HGB 14.5 14.3  HCT 44.2 44.4  MCV 90.4 91.9  PLT 350 356    LFT Recent Labs  Lab 03/17/24 0516  AST 15  ALT 11  ALKPHOS 79  BILITOT 0.7  PROT 7.3  ALBUMIN 3.3*     Antibiotics: Anti-infectives (From admission, onward)    None        DVT prophylaxis: Lovenox   Code Status: Full code  Family Communication:  No family at bedside   CONSULTS neurology   Subjective      Objective    Physical Examination:    Status is: Inpatient:             Wendy Parks   [facility] If 7PM-7AM, please contact night-coverage at [email or web address], Office  [phone]   03/19/2024, 8:42 AM  LOS: 2 days

## 2024-03-20 ENCOUNTER — Telehealth: Payer: Self-pay | Admitting: *Deleted

## 2024-03-20 NOTE — Transitions of Care (Post Inpatient/ED Visit) (Signed)
 03/20/2024  Name: Wendy Parks MRN: [id number] DOB: 1981/07/17  Today's TOC FU Call Status: Today's TOC FU Call Status:: Successful TOC FU Call Completed TOC FU Call Complete Date: 03/20/24 Patient's Name and Date of Birth confirmed.  Transition Care Management Follow-up Telephone Call Date of Discharge: 03/19/24 Discharge Facility: [facility]) Type of Discharge: Inpatient Admission Primary Inpatient Discharge Diagnosis:: Intractable migraine with aura with status migrainosus How have you been since you were released from the hospital?: Better Any questions or concerns?: No  Items Reviewed: Did you receive and understand the discharge instructions provided?: Yes Medications obtained,verified, and reconciled?: Partial Review Completed Reason for Partial Mediation Review: Patient is a [employer] employee and declined to continue TOC assessment and medication review. Dietary orders reviewed?: Yes Type of Diet Ordered:: low sodium heart healthy Do you have support at home?: Yes People in Home [RPT]: spouse Name of Support/Comfort Primary Source: Spouse/[place]  Medications Reviewed Today:Partial review completed Medications Reviewed Today     Reviewed by Lucky Andrea LABOR, RN (Registered Nurse) on 03/20/24 at 1024  Med List Status: <None>   Medication Order Taking? Sig Documenting Provider Last Dose Status Informant  AJOVY 225 MG/1.5ML SOAJ [id number]  Inject 1.5 mLs into the skin every 30 (thirty) days. [provider]  Active Self, Pharmacy Records  albuterol  (VENTOLIN  HFA) 108 [phone] Base) MCG/ACT inhaler [id number]  TAKE 2 PUFFS BY MOUTH EVERY 6 HOURS AS NEEDED FOR WHEEZE OR SHORTNESS OF SHERIDA Jarold Medici, MD  Active Self, Pharmacy Records  amLODipine  (NORVASC ) 2.5 MG tablet [id number]  Take 2 tabs po daily Jarold Medici, MD  Active Self, Pharmacy Records  baclofen (LIORESAL) 10 MG tablet [id number]  Take 10 mg by mouth 2 (two) times daily as needed. [provider]  Active Self, Pharmacy Records  BOTOX 100 units SOLR injection [id number]  Inject 100 Units into the muscle every 3 (three) months. [provider]  Active Self, Pharmacy Records  butalbital -acetaminophen -caffeine  (FIORICET) 50-325-40 MG tablet [id number]  Take 1-2 tablets by mouth every 12 (twelve) hours as needed for headache. Jarold Medici, MD  Active Self, Pharmacy Records  cholecalciferol (VITAMIN D3) 25 MCG (1000 UNIT) tablet [id number]  Take 1,000 Units by mouth daily. [provider]  Active Self, Pharmacy Records  citalopram  (CELEXA ) 10 MG tablet [id number]  Take 1 tablet (10 mg total) by mouth daily. Jarold Medici, MD  Active Self, Pharmacy Records  fluticasone  (FLONASE ) 50 MCG/ACT nasal spray [id number] Yes Place 1 spray into both nostrils daily. Drusilla Sabas RAMAN, MD  Active   guaiFENesin  (MUCINEX ) 600 MG 12 hr tablet [id number] Yes Take 1 tablet (600 mg total) by mouth 2 (two) times daily for 5 days. Drusilla Sabas RAMAN, MD  Active   metFORMIN  (GLUCOPHAGE -XR) 500 MG 24 hr tablet [id number]  Take 1 tablet (500 mg total) by mouth daily with breakfast. Jarold Medici, MD  Active Self, Pharmacy Records           Med Note EVERETTE JESUSA BROCKS   Sat Mar 16, 2024  8:31 AM) Has not started taking due to migraines  ondansetron  (ZOFRAN ) 4 MG tablet [id number]  Take 1 tablet (4 mg total) by mouth every 8 (eight) hours as needed for nausea or vomiting. Jarold Medici, MD  Active Self, Pharmacy Records  oxyCODONE  (ROXICODONE ) 5 MG immediate release tablet [id number] Yes Take 1 tablet (5 mg total) by mouth every 8 (eight) hours as needed. Drusilla Sabas RAMAN, MD  Active   phentermine  (ADIPEX-P ) 37.5 MG tablet  [id number]  Take 1 tablet (37.5 mg total) by mouth daily before breakfast. Jarold Medici, MD  Active Self, Pharmacy Records  predniSONE  (DELTASONE ) 10 MG tablet [id number] Yes Prednisone  40 mg po daily x 1 day then Prednisone  30 mg po daily x 1 day then Prednisone  20 mg po daily x 1 day then  Prednisone  10 mg daily x 1 day then stop... Drusilla Sabas RAMAN, MD  Active   rizatriptan (MAXALT) 10 MG tablet [id number]  Take 5 mg by mouth as needed for migraine. [provider]  Active Self, Pharmacy Records           Med Note EVERETTE BRISKER C   Sat Mar 16, 2024  8:35 AM) Pt states she takes the whole 10mg  tablets.  Takes 1 tablet po daily  topiramate  (TOPAMAX ) 200 MG tablet [id number] Yes Take 1 tablet (200 mg total) by mouth at bedtime. Drusilla Sabas RAMAN, MD  Active             Home Care and Equipment/Supplies: Were Home Health Services Ordered?: No Any new equipment or medical supplies ordered?: No  Functional Questionnaire:    Follow up appointments reviewed: PCP Follow-up appointment confirmed?: Yes Date of PCP follow-up appointment?: 04/11/24 Follow-up Provider: Dr. Jarold Specialist [facility] Follow-up appointment confirmed?: Yes Date of Specialist follow-up appointment?: 03/21/24 Follow-Up Specialty Provider:: Dr. Oneita Do you understand care options if your condition(s) worsen?: Yes-patient verbalized understanding  Patient reports being a [employer] employee(not indicated in Epic) and familiar with these questions declined to continue with TOC assessment.   Andrea Dimes RN, BSN   [facility] RN Care Manager [phone]

## 2024-03-21 ENCOUNTER — Other Ambulatory Visit: Payer: Self-pay | Admitting: Internal Medicine

## 2024-03-21 DIAGNOSIS — G518 Other disorders of facial nerve: Secondary | ICD-10-CM | POA: Diagnosis not present

## 2024-03-21 DIAGNOSIS — M542 Cervicalgia: Secondary | ICD-10-CM | POA: Diagnosis not present

## 2024-03-21 DIAGNOSIS — G43719 Chronic migraine without aura, intractable, without status migrainosus: Secondary | ICD-10-CM | POA: Diagnosis not present

## 2024-03-21 DIAGNOSIS — M791 Myalgia, unspecified site: Secondary | ICD-10-CM | POA: Diagnosis not present

## 2024-03-21 MED ORDER — FELODIPINE ER 5 MG PO TB24: 5.0000 mg | ORAL_TABLET | Freq: Every day | ORAL | 1 refills | Status: DC

## 2024-03-26 ENCOUNTER — Inpatient Hospital Stay: Admitting: Internal Medicine

## 2024-03-27 DIAGNOSIS — F4312 Post-traumatic stress disorder, chronic: Secondary | ICD-10-CM | POA: Diagnosis not present

## 2024-04-01 ENCOUNTER — Other Ambulatory Visit: Payer: Self-pay | Admitting: Internal Medicine

## 2024-04-01 DIAGNOSIS — G43E19 Chronic migraine with aura, intractable, without status migrainosus: Secondary | ICD-10-CM

## 2024-04-01 DIAGNOSIS — F4312 Post-traumatic stress disorder, chronic: Secondary | ICD-10-CM | POA: Diagnosis not present

## 2024-04-04 ENCOUNTER — Other Ambulatory Visit: Payer: Self-pay | Admitting: Orthopaedic Surgery

## 2024-04-04 DIAGNOSIS — S5011XD Contusion of right forearm, subsequent encounter: Secondary | ICD-10-CM

## 2024-04-10 ENCOUNTER — Ambulatory Visit
Admission: RE | Admit: 2024-04-10 | Discharge: 2024-04-10 | Disposition: A | Discharge status: Home / Self Care | Payer: Worker's Compensation | Source: Ambulatory Visit | Attending: Orthopaedic Surgery | Admitting: Orthopaedic Surgery

## 2024-04-10 DIAGNOSIS — S5011XD Contusion of right forearm, subsequent encounter: Secondary | ICD-10-CM | POA: Diagnosis present

## 2024-04-11 ENCOUNTER — Ambulatory Visit: Admitting: Internal Medicine

## 2024-04-11 ENCOUNTER — Encounter: Payer: Self-pay | Admitting: Internal Medicine

## 2024-04-11 VITALS — BP 110/82 | HR 67 | Temp 98.1°F | Ht 65.0 in | Wt 226.7 lb

## 2024-04-11 DIAGNOSIS — G43E19 Chronic migraine with aura, intractable, without status migrainosus: Secondary | ICD-10-CM | POA: Diagnosis not present

## 2024-04-11 DIAGNOSIS — I10 Essential (primary) hypertension: Secondary | ICD-10-CM

## 2024-04-11 MED ORDER — NURTEC 75 MG PO TBDP: ORAL_TABLET | ORAL | Status: DC

## 2024-04-11 NOTE — Progress Notes (Signed)
 I,[name], CMA,acting as a Neurosurgeon for [name], MD.,have documented all relevant documentation on the behalf of [name], MD,as directed by  [name], MD while in the presence of [name], MD.  Subjective:  Patient ID: Wendy Parks , female    DOB: [birth year] , 43 y.o.   MRN: [id number]  Chief Complaint  Patient presents with   Hospitalization Follow-up    Patient presents today for hospital follow up. Admitted on 7/26 & discharged on 7/29 for Migraine. Today she reports still experiencing migraine rates 8/10. She had Nerve block completed with Dr Oneita on Thursday.  She reports stopping phentermine  for now.     HPI Discussed the use of AI scribe software for clinical note transcription with the patient, who gave verbal consent to proceed.  History of Present Illness Wendy Parks is a 43 year old female with migraines who presents for follow-up after a recent hospital admission for a prolonged migraine with aura.  She was admitted to the hospital on July 26th and discharged on July 29th for a migraine with aura that lasted at least ten days. Her symptoms included facial numbness, more pronounced on the right side, nausea, and photophobia. During her hospital stay, she was treated with Reglan , magnesium , Toradol , and IV Depakote without improvement. She experienced improvement after receiving Decadron  and was discharged on a prednisone  taper and Topamax  200 mg nightly.  Post-discharge, her medications were changed. She is no longer on Topamax , which she had been taking for years. She is currently taking chlorpromazine and zonisamide (Zonegran), and she has been using rizatriptan (Maxalt) as needed, limited to six doses per month. She also uses Fioricet and a muscle relaxer, baclofen, but these do not provide significant relief. She has been on Ajovy for over a year, with her last injection on August 1st, but has discontinued it.  She has had a migraine  for the past month, with daily pain levels between five and eight. She experiences visual disturbances, stating she has to take 'double takes' or 'triples' as the pain increases. She has not been taking magnesium  supplements, although it is on her list to purchase, and she feels she could be better hydrated.  Her blood pressure medication was switched from amlodipine  to felodipine .   Migraine  This is a chronic problem. The current episode started 1 to 4 weeks ago. The problem occurs daily. The problem has been gradually worsening. The pain is located in the Bilateral and frontal region. The pain quality is similar to prior headaches. The quality of the pain is described as aching. The pain is at a severity of 9/10. The pain is severe. Associated symptoms include nausea. Pertinent negatives include no anorexia or back pain.     Past Medical History:  Diagnosis Date   Esophageal reflux 11/26/2020   Gastritis 11/26/2020   Headache    Migranes   Herpes simplex 12/25/2018   Hypertension    Kidney stone      Family History  Problem Relation Age of Onset   Healthy Mother    Dementia Father    Cancer Father      Current Outpatient Medications:    AJOVY 225 MG/1.5ML SOAJ, Inject 1.5 mLs into the skin every 30 (thirty) days., Disp: , Rfl:    albuterol  (VENTOLIN  HFA) 108 (90 Base) MCG/ACT inhaler, TAKE 2 PUFFS BY MOUTH EVERY 6 HOURS AS NEEDED FOR WHEEZE OR SHORTNESS OF BREATH, Disp: 18 each, Rfl: 3  baclofen (LIORESAL) 10 MG tablet, Take 10 mg by mouth 2 (two) times daily as needed., Disp: , Rfl:    BOTOX 100 units SOLR injection, Inject 100 Units into the muscle every 3 (three) months., Disp: , Rfl:    butalbital -acetaminophen -caffeine  (FIORICET) 50-325-40 MG tablet, Take 1-2 tablets by mouth every 12 (twelve) hours as needed for headache., Disp: 20 tablet, Rfl: 0   cholecalciferol (VITAMIN D3) 25 MCG (1000 UNIT) tablet, Take 1,000 Units by mouth daily., Disp: , Rfl:    citalopram  (CELEXA )  10 MG tablet, Take 1 tablet (10 mg total) by mouth daily., Disp: 90 tablet, Rfl: 1   felodipine  (PLENDIL ) 5 MG 24 hr tablet, Take 1 tablet (5 mg total) by mouth daily., Disp: 90 tablet, Rfl: 1   fluticasone  (FLONASE ) 50 MCG/ACT nasal spray, Place 1 spray into both nostrils daily., Disp: 15.8 mL, Rfl: 0   metFORMIN  (GLUCOPHAGE -XR) 500 MG 24 hr tablet, Take 1 tablet (500 mg total) by mouth daily with breakfast., Disp: 90 tablet, Rfl: 0   ondansetron  (ZOFRAN ) 4 MG tablet, Take 1 tablet (4 mg total) by mouth every 8 (eight) hours as needed for nausea or vomiting., Disp: 20 tablet, Rfl: 0   oxyCODONE  (ROXICODONE ) 5 MG immediate release tablet, Take 1 tablet (5 mg total) by mouth every 8 (eight) hours as needed., Disp: 20 tablet, Rfl: 0   Rimegepant Sulfate (NURTEC) 75 MG TBDP, One tab po every day prn, Disp: , Rfl:    rizatriptan (MAXALT) 10 MG tablet, Take 5 mg by mouth as needed for migraine., Disp: , Rfl:    zonisamide (ZONEGRAN) 25 MG capsule, TAKE 4 CAPSULES BY ORAL ROUTE DAILY FOR 30 DAYS, Disp: , Rfl:    No Known Allergies   Review of Systems  Constitutional: Negative.   Respiratory: Negative.    Cardiovascular: Negative.   Gastrointestinal:  Positive for nausea. Negative for anorexia.  Musculoskeletal:  Negative for back pain.  Neurological:  Positive for headaches.  Psychiatric/Behavioral: Negative.       Today's Vitals   04/11/24 1443  BP: 110/82  Pulse: 67  Temp: 98.1 F (36.7 C)  SpO2: 98%  Weight: 226 lb 11.2 oz (102.8 kg)  Height: 5' 5 (1.651 m)   Body mass index is 37.72 kg/m.  Wt Readings from Last 3 Encounters:  04/11/24 226 lb 11.2 oz (102.8 kg)  03/11/24 225 lb 9.6 oz (102.3 kg)  03/04/24 230 lb 12.8 oz (104.7 kg)     Objective:  Physical Exam Vitals and nursing note reviewed.  Constitutional:      Appearance: Normal appearance.  HENT:     Head: Normocephalic and atraumatic.  Eyes:     Extraocular Movements: Extraocular movements intact.   Cardiovascular:     Rate and Rhythm: Normal rate and regular rhythm.     Heart sounds: Normal heart sounds.  Pulmonary:     Effort: Pulmonary effort is normal.     Breath sounds: Normal breath sounds.  Musculoskeletal:     Cervical back: Normal range of motion.  Skin:    General: Skin is warm.  Neurological:     General: No focal deficit present.     Mental Status: She is alert.  Psychiatric:        Mood and Affect: Mood normal.        Behavior: Behavior normal.         Assessment And Plan:  Intractable chronic migraine with aura and without status migrainosus Assessment & Plan: Chronic migraine  with aura unresponsive to current treatment regimen, including rizatriptan, baclofen, and Fioricet. Ajovy discontinued. Visual disturbances noted with increased migraine severity. - Provide samples of Nurtec for immediate trial. - Consider starting Qulipta on September 1st, pending neurologist confirmation. - Instruct her to email neurologist regarding lack of improvement and inquire about transitioning from zonisamide to Qulipta. - Advise magnesium  glycinate at night. - Ensure adequate hydration.   Primary hypertension Assessment & Plan: Hypertension controlled with felodipine  5 mg daily. No impact on migraine symptoms. - Continue felodipine  5 mg daily in the morning.   Other orders -     Nurtec; One tab po every day prn   Return for resch sep apt to october .  Patient was given opportunity to ask questions. Patient verbalized understanding of the plan and was able to repeat key elements of the plan. All questions were answered to their satisfaction.   I, [name], MD, have reviewed all documentation for this visit. The documentation on 04/11/24 for the exam, diagnosis, procedures, and orders are all accurate and complete.   IF YOU HAVE BEEN REFERRED TO A SPECIALIST, IT MAY TAKE 1-2 WEEKS TO SCHEDULE/PROCESS THE REFERRAL. IF YOU HAVE NOT HEARD FROM US /SPECIALIST IN TWO  WEEKS, PLEASE GIVE US  A CALL AT [phone].   THE PATIENT IS ENCOURAGED TO PRACTICE SOCIAL DISTANCING DUE TO THE COVID-19 PANDEMIC.

## 2024-04-11 NOTE — Patient Instructions (Addendum)
 Magnesium  glycinate  Migraine Headache A migraine headache is an intense pulsing or throbbing pain on one or both sides of the head. Migraine headaches may also cause other symptoms, such as nausea, vomiting, and sensitivity to light and noise. A migraine headache can last from 4 hours to 3 days. Talk with your health care provider about what things may bring on (trigger) your migraine headaches. What are the causes? The exact cause is not known. However, a migraine may be caused when nerves in the brain get irritated and release chemicals that cause blood vessels to become inflamed. This inflammation causes pain. Migraines may be triggered or caused by: Smoking. Medicines, such as: Nitroglycerin, which is used to treat chest pain. Birth control pills. Estrogen. Certain blood pressure medicines. Foods or drinks that contain nitrates, glutamate, aspartame, MSG, or tyramine. Certain foods or drinks, such as aged cheeses, chocolate, alcohol, or caffeine . Doing physical activity that is very hard. Other triggers may include: Menstruation. Pregnancy. Hunger. Stress. Getting too much or too little sleep. Weather changes. Tiredness (fatigue). What increases the risk? The following factors may make you more likely to have migraine headaches: Being between the ages of 83-46 years old. Being female. Having a family history of migraine headaches. Being Caucasian. Having a mental health condition, such as depression or anxiety. Being obese. What are the signs or symptoms? The main symptom of this condition is pulsing or throbbing pain. This pain may: Happen in any area of the head, such as on one or both sides. Make it hard to do daily activities. Get worse with physical activity. Get worse around bright lights, loud noises, or smells. Other symptoms may include: Nausea. Vomiting. Dizziness. Before a migraine headache starts, you may get warning signs (an aura). An aura may  include: Seeing flashing lights or having blind spots. Seeing bright spots, halos, or zigzag lines. Having tunnel vision or blurred vision. Having numbness or a tingling feeling. Having trouble talking. Having muscle weakness. After a migraine ends, you may have symptoms. These may include: Feeling tired. Trouble concentrating. How is this diagnosed? A migraine headache can be diagnosed based on: Your symptoms. A physical exam. Tests, such as: A CT scan or an MRI of the head. These tests can help rule out other causes of headaches. Taking fluid from the spine (lumbar puncture) to examine it (cerebrospinal fluid analysis, or CSF analysis). How is this treated? This condition may be treated with medicines that: Relieve pain and nausea. Prevent migraines. Treatment may also include: Acupuncture. Lifestyle changes like avoiding foods that trigger migraine headaches. Learning ways to control your body (biofeedback). Talk therapy to help you know and deal with negative thoughts (cognitive behavioral therapy). Follow these instructions at home: Medicines Take over-the-counter and prescription medicines only as told by your provider. Ask your provider if the medicine prescribed to you: Requires you to avoid driving or using machinery. Can cause constipation. You may need to take these actions to prevent or treat constipation: Drink enough fluid to keep your pee (urine) pale yellow. Take over-the-counter or prescription medicines. Eat foods that are high in fiber, such as beans, whole grains, and fresh fruits and vegetables. Limit foods that are high in fat and processed sugars, such as fried or sweet foods. Lifestyle  Do not drink alcohol. Do not use any products that contain nicotine or tobacco. These products include cigarettes, chewing tobacco, and vaping devices, such as e-cigarettes. If you need help quitting, ask your provider. Get 7-9 hours of sleep each  night, or the amount  recommended by your provider. Find ways to manage stress, such as meditation, deep breathing, or yoga. Try to exercise regularly. This can help lessen how bad and how often your migraines occur. General instructions Keep a journal to find out what triggers your migraines, so you can avoid those things. For example, write down: What you eat and drink. How much sleep you get. Any change to your diet or medicines. If you have a migraine headache: Avoid things that make your symptoms worse, such as bright lights. Lie down in a dark, quiet room. Do not drive or use machinery. Ask your provider what activities are safe for you while you have symptoms. Keep all follow-up visits. Your provider will monitor your symptoms and recommend any further treatment. Where to find more information Coalition for Headache and Migraine Patients (CHAMP): headachemigraine.org American Migraine Foundation: americanmigrainefoundation.org National Headache Foundation: headaches.org Contact a health care provider if: You have symptoms that are different or worse than your usual migraine headache symptoms. You have more than 15 days of headaches in one month. Get help right away if: Your migraine headache becomes severe or lasts more than 72 hours. You have a fever or stiff neck. You have vision loss. Your muscles feel weak or like you cannot control them. You lose your balance often or have trouble walking. You faint. You have a seizure. This information is not intended to replace advice given to you by your health care provider. Make sure you discuss any questions you have with your health care provider. Document Revised: 04/04/2022 Document Reviewed: 04/04/2022 Elsevier Patient Education  2024 [employer].

## 2024-04-15 DIAGNOSIS — F4312 Post-traumatic stress disorder, chronic: Secondary | ICD-10-CM | POA: Diagnosis not present

## 2024-04-15 NOTE — Assessment & Plan Note (Signed)
 Hypertension controlled with felodipine  5 mg daily. No impact on migraine symptoms. - Continue felodipine  5 mg daily in the morning.

## 2024-04-15 NOTE — Assessment & Plan Note (Signed)
 Chronic migraine with aura unresponsive to current treatment regimen, including rizatriptan, baclofen, and Fioricet. Ajovy discontinued. Visual disturbances noted with increased migraine severity. - Provide samples of Nurtec for immediate trial. - Consider starting Qulipta on September 1st, pending neurologist confirmation. - Instruct her to email neurologist regarding lack of improvement and inquire about transitioning from zonisamide to Qulipta. - Advise magnesium  glycinate at night. - Ensure adequate hydration.

## 2024-04-16 DIAGNOSIS — G43719 Chronic migraine without aura, intractable, without status migrainosus: Secondary | ICD-10-CM | POA: Diagnosis not present

## 2024-04-29 DIAGNOSIS — F4312 Post-traumatic stress disorder, chronic: Secondary | ICD-10-CM | POA: Diagnosis not present

## 2024-05-02 NOTE — Progress Notes (Unsigned)
 NEUROLOGY CONSULTATION NOTE  Aki Burdin MRN: [id number] DOB: [birth year]  Referring provider: Catheryn Slocumb, MD Primary care provider: Catheryn Slocumb, MD  Reason for consult:  headache  Assessment/Plan:   Status migrainosus, with aura, intractable  Given MRI findings (transverse sinus narrowing), although nonspecific, consider increased intracranial pressure as she has not fully responded to DHE:  Refer to ophthalmology.  Pending results, consider LP to measure opening pressure. Migraine prevention:  I would continue current management as per Dr. Oneita (Botox, trigger point injections, zonisamide 100mg  daily) Migraine rescue:  Provide samples of Nurtec as it did provide some relief.  As she is in a persistent headache, I explained that a rescue medication at this point will not completely abort it.  Would discontinue Fioricet due to high propensity for rebound headache Limit use of pain relievers to no more than 9 days out of the month to prevent risk of rebound or medication-overuse headache. Keep headache diary Lifestyle modification Follow up 4 months.  Total time spent on today's visit was 52 minutes dedicated to this patient today, preparing to see patient, examining the patient, ordering tests and/or medications and counseling the patient, documenting clinical information in the EHR or other health record, independently interpreting results and communicating results to the patient/family, discussing treatment and goals, answering patient's questions and coordinating care.    Subjective:  Wendy Parks is a 43 year old right-handed female with HTN and history of kidney stones who presents for intractable migraines.  History supplemented by hospital records and referring provider's notes.  Onset:  43 years old.   Location:  varies - unilateral or bilateral frontal, temporal, occipital Quality:  pressure, throbbing, pounding, stabbing Intensity:  7/10.  Aura:  spots in vision,  sometimes precedes headache Prodrome:  absent Associated symptoms:  Nausea, photophobia, phonophobia, osmophobia .  Rarely vomiting.  She denies associated unilateral numbness or weakness. Duration:  1 to 3 days Frequency:  initially 20 days a month, then decreased to 10-15 a month on the Ajovy. Triggers:  emotional stress, change in weather/barometric pressure, some alcohol, possibly menstrual cycle but has had an ablation Relieving factors:  ice pack, rest Activity:  aggravates  Intractable migraine since 03/04/2024.  This migraine has been associated with bilateral facial numbness.  Usual treatments have not helped.  Went to the hospital on 03/18/2024 and received headache cocktail which didn't work.  While prior MRI of brain from 2023 demonstrated partial empty sella but repeat MRI of brain with and without contrast did not reveal any evidence for pseudotumor cerebri.  MRV of head revealed transverse sinus narrowing.  Admitted for DHE and Mg which was ineffective.  Received 1g IV Depakene  without improvement and had vivid dreams with it.  Given Decadron  and discharged on prednisone  taper.  Still with migraines.    Past NSAIDS/analgesics:  Toradol  IV, ibuprofen , naproxen, Tylenol , Excedrin Past abortive triptans:  sumatriptan tab, eletriptan Past abortive ergotamine:  DHE IV Past muscle relaxants:  Flexeril  Past anti-emetic:  Reglan  IV Past antihypertensive medications:  propranolol Past antidepressant medications:  amitriptyline  Past anticonvulsant medications:  topiramate , Qudexy , Depakote Past anti-CGRP:  Aimovig, Emgality, Ajovy,  Nurtec PRN samples (ineffective but has been in status migrainosus) Past vitamins/Herbal/Supplements:  feverfew Past antihistamines/decongestants:  Benadryl  Other past therapies:  none  Rescue protocol:  baclofen first line, rizatriptan second line; Fioricet Current NSAIDS/analgesics:  Fioricet  Current triptans:  rizatriptan 10mg  Current ergotamine:   none Current anti-emetic:  Zofran -ODT 4mg  Current muscle relaxants:  baclofen 10mg  PRN Current  Antihypertensive medications:  felodipine  Current Antidepressant medications:  citalopram  10mg  Current Anticonvulsant medications:  zonisamide 100mg  daily (since July) Current anti-CGRP: none Current Vitamins/Herbal/Supplements:  Magnesium  Current Antihistamines/Decongestants:  none Other therapy:  ice pack Birth control:  no Other medications:  Botox, trigger point injections   Caffeine :  No caffeine  for last month in a half. Alcohol:  no Smoker:  no Diet:  80 oz water daily.  Smoothies, sandwich, grilled chicken, salmon, shrimp; cut out red meat in last month. Exercise:  trying to increase walking in the morning. Depression:  some; Anxiety:  yes.  Busy.  Has 3 jobs.  Has 3 kids in college. Sleep hygiene:  varies.  Part time job is third shift which may interfere with routine schedule  History of TBI/concussion:  no Family history of headache:  no Family history of cerebral aneurysm:  no      PAST MEDICAL HISTORY: Past Medical History:  Diagnosis Date   Esophageal reflux 11/26/2020   Gastritis 11/26/2020   Headache    Migranes   Herpes simplex 12/25/2018   Hypertension    Kidney stone     PAST SURGICAL HISTORY: Past Surgical History:  Procedure Laterality Date   BREAST BIOPSY Right 09/21/2023   US  RT BREAST BX W LOC DEV 1ST LESION IMG BX SPEC US  GUIDE 09/21/2023 [facility]   BREAST BIOPSY Right 11/27/2023   US  RT BREAST BX W LOC DEV 1ST LESION IMG BX SPEC US  GUIDE 11/27/2023 [facility]   BREAST BIOPSY  01/16/2024   MM RT RADIOACTIVE SEED LOC MAMMO GUIDE 01/16/2024 [facility]   BUNIONECTOMY  11/16/2020   ENDOMETRIAL ABLATION  01/2019   Dr. Henry   EXTRACORPOREAL SHOCK WAVE LITHOTRIPSY Left 07/10/2023   Procedure: LEFT EXTRACORPOREAL SHOCK WAVE LITHOTRIPSY (ESWL);  Surgeon: Cam Morene ORN, MD;  Location: [facility];  Service:  Urology;  Laterality: Left;   KIDNEY STONE SURGERY     RADIOACTIVE SEED GUIDED AXILLARY SENTINEL LYMPH NODE Right 01/17/2024   Procedure: RADIOACTIVE SEED GUIDED EXCISIONAL BREAST BIOPSY;  Surgeon: Belinda Cough, MD;  Location: [facility];  Service: General;  Laterality: Right;  LMA RIGHT BREAST RADIOACTIVE SEED LOCALIZED EXCISIONAL BIOPSY   TUBAL LIGATION  2012    MEDICATIONS: Current Outpatient Medications on File Prior to Visit  Medication Sig Dispense Refill   AJOVY 225 MG/1.5ML SOAJ Inject 1.5 mLs into the skin every 30 (thirty) days.     albuterol  (VENTOLIN  HFA) 108 (90 Base) MCG/ACT inhaler TAKE 2 PUFFS BY MOUTH EVERY 6 HOURS AS NEEDED FOR WHEEZE OR SHORTNESS OF BREATH 18 each 3   baclofen (LIORESAL) 10 MG tablet Take 10 mg by mouth 2 (two) times daily as needed.     BOTOX 100 units SOLR injection Inject 100 Units into the muscle every 3 (three) months.     butalbital -acetaminophen -caffeine  (FIORICET) 50-325-40 MG tablet Take 1-2 tablets by mouth every 12 (twelve) hours as needed for headache. 20 tablet 0   cholecalciferol (VITAMIN D3) 25 MCG (1000 UNIT) tablet Take 1,000 Units by mouth daily.     citalopram  (CELEXA ) 10 MG tablet Take 1 tablet (10 mg total) by mouth daily. 90 tablet 1   felodipine  (PLENDIL ) 5 MG 24 hr tablet Take 1 tablet (5 mg total) by mouth daily. 90 tablet 1   fluticasone  (FLONASE ) 50 MCG/ACT nasal spray Place 1 spray into both nostrils daily. 15.8 mL 0   metFORMIN  (GLUCOPHAGE -XR) 500 MG 24 hr tablet Take 1 tablet (500 mg total) by  mouth daily with breakfast. 90 tablet 0   ondansetron  (ZOFRAN ) 4 MG tablet Take 1 tablet (4 mg total) by mouth every 8 (eight) hours as needed for nausea or vomiting. 20 tablet 0   oxyCODONE  (ROXICODONE ) 5 MG immediate release tablet Take 1 tablet (5 mg total) by mouth every 8 (eight) hours as needed. 20 tablet 0   Rimegepant Sulfate (NURTEC) 75 MG TBDP One tab po every day prn     rizatriptan (MAXALT) 10 MG tablet Take 5  mg by mouth as needed for migraine.     zonisamide (ZONEGRAN) 25 MG capsule TAKE 4 CAPSULES BY ORAL ROUTE DAILY FOR 30 DAYS     No current facility-administered medications on file prior to visit.    ALLERGIES: No Known Allergies  FAMILY HISTORY: Family History  Problem Relation Age of Onset   Healthy Mother    Dementia Father    Cancer Father     Objective:  Blood pressure 133/86, pulse 79, height 5' 5 (1.651 m), weight 225 lb (102.1 kg), SpO2 98%. General: No acute distress.  Patient appears well-groomed.   Head:  Normocephalic/atraumatic Eyes:  fundi examined but not visualized Neck: supple, no paraspinal tenderness, full range of motion Heart: regular rate and rhythm Neurological Exam: Mental status: alert and oriented to person, place, and time, speech fluent and not dysarthric, language intact. Cranial nerves: CN I: not tested CN II: pupils equal, round and reactive to light, visual fields intact CN III, IV, VI:  full range of motion, no nystagmus, no ptosis CN V: facial sensation intact. CN VII: upper and lower face symmetric CN VIII: hearing intact CN IX, X: gag intact, uvula midline CN XI: sternocleidomastoid and trapezius muscles intact CN XII: tongue midline Bulk & Tone: normal, no fasciculations. Motor:  muscle strength 5/5 throughout Sensation:  Pinprick and vibratory sensation intact. Deep Tendon Reflexes:  2+ throughout,  toes downgoing.   Finger to nose testing:  Without dysmetria.   Gait:  Normal station and stride.  Romberg negative.    Thank you for allowing me to take part in the care of this patient.  Juliene Dunnings, DO  CC: Catheryn Slocumb, MD

## 2024-05-03 ENCOUNTER — Encounter: Payer: Self-pay | Admitting: Neurology

## 2024-05-03 ENCOUNTER — Ambulatory Visit (INDEPENDENT_AMBULATORY_CARE_PROVIDER_SITE_OTHER): Admitting: Neurology

## 2024-05-03 VITALS — BP 133/86 | HR 79 | Ht 65.0 in | Wt 225.0 lb

## 2024-05-03 DIAGNOSIS — G43111 Migraine with aura, intractable, with status migrainosus: Secondary | ICD-10-CM

## 2024-05-03 DIAGNOSIS — E236 Other disorders of pituitary gland: Secondary | ICD-10-CM

## 2024-05-03 NOTE — Progress Notes (Signed)
 Medication Samples have been provided to the patient.  Drug name: Nurtec       Strength: 75 mg        Qty: 2   LOT: [id number]  Exp.Date: 11/28  Dosing instructions: as needed  The patient has been instructed regarding the correct time, dose, and frequency of taking this medication, including desired effects and most common side effects.   [place] JINNY Sharper 10:29 AM 05/03/2024

## 2024-05-03 NOTE — Patient Instructions (Addendum)
 Continue current regimen with Dr. Oneita Refer to ophthalmology - further recommendations pending results. [facility] Address: [address], [place], [place] [address] Phone: [phone] Follow up in 4 months for now (again, further recommendations pending results) Nurtec once daily as needed

## 2024-05-08 DIAGNOSIS — M791 Myalgia, unspecified site: Secondary | ICD-10-CM | POA: Diagnosis not present

## 2024-05-08 DIAGNOSIS — G43719 Chronic migraine without aura, intractable, without status migrainosus: Secondary | ICD-10-CM | POA: Diagnosis not present

## 2024-05-08 DIAGNOSIS — M542 Cervicalgia: Secondary | ICD-10-CM | POA: Diagnosis not present

## 2024-05-08 DIAGNOSIS — G518 Other disorders of facial nerve: Secondary | ICD-10-CM | POA: Diagnosis not present

## 2024-05-11 ENCOUNTER — Encounter: Payer: Self-pay | Admitting: Internal Medicine

## 2024-05-13 ENCOUNTER — Encounter: Admitting: Internal Medicine

## 2024-05-13 DIAGNOSIS — F4312 Post-traumatic stress disorder, chronic: Secondary | ICD-10-CM | POA: Diagnosis not present

## 2024-05-13 MED ORDER — BENZONATATE 100 MG PO CAPS: 100.0000 mg | ORAL_CAPSULE | Freq: Three times a day (TID) | ORAL | 1 refills | Status: DC | PRN

## 2024-05-13 NOTE — Patient Instructions (Signed)

## 2024-05-13 NOTE — Progress Notes (Signed)
 error

## 2024-05-14 ENCOUNTER — Ambulatory Visit: Admitting: Internal Medicine

## 2024-05-27 DIAGNOSIS — F4312 Post-traumatic stress disorder, chronic: Secondary | ICD-10-CM | POA: Diagnosis not present

## 2024-05-29 DIAGNOSIS — R519 Headache, unspecified: Secondary | ICD-10-CM | POA: Diagnosis not present

## 2024-05-29 DIAGNOSIS — H04123 Dry eye syndrome of bilateral lacrimal glands: Secondary | ICD-10-CM | POA: Diagnosis not present

## 2024-06-06 ENCOUNTER — Other Ambulatory Visit: Payer: Self-pay | Admitting: Internal Medicine

## 2024-06-10 DIAGNOSIS — F4312 Post-traumatic stress disorder, chronic: Secondary | ICD-10-CM | POA: Diagnosis not present

## 2024-06-13 ENCOUNTER — Ambulatory Visit (INDEPENDENT_AMBULATORY_CARE_PROVIDER_SITE_OTHER): Admitting: Internal Medicine

## 2024-06-13 ENCOUNTER — Encounter: Payer: Self-pay | Admitting: Internal Medicine

## 2024-06-13 VITALS — BP 130/82 | HR 89 | Temp 98.1°F | Ht 65.0 in | Wt 229.0 lb

## 2024-06-13 DIAGNOSIS — I10 Essential (primary) hypertension: Secondary | ICD-10-CM | POA: Diagnosis not present

## 2024-06-13 DIAGNOSIS — D72829 Elevated white blood cell count, unspecified: Secondary | ICD-10-CM

## 2024-06-13 DIAGNOSIS — R7303 Prediabetes: Secondary | ICD-10-CM | POA: Diagnosis not present

## 2024-06-13 DIAGNOSIS — G43E09 Chronic migraine with aura, not intractable, without status migrainosus: Secondary | ICD-10-CM | POA: Diagnosis not present

## 2024-06-13 MED ORDER — NURTEC 75 MG PO TBDP: ORAL_TABLET | ORAL | 2 refills | Status: AC

## 2024-06-13 MED ORDER — ONDANSETRON HCL 4 MG PO TABS: 4.0000 mg | ORAL_TABLET | Freq: Three times a day (TID) | ORAL | 0 refills | Status: AC | PRN

## 2024-06-13 NOTE — Assessment & Plan Note (Signed)
 On metformin  for less than a month, addressing potential PCOS symptoms. Irregular cycles may relate to insulin  resistance. Discussed lifestyle modifications. - Check kidney function. - Check testosterone levels. - Advise increasing cruciferous vegetables. - Recommend regular exercise and reducing sugar intak

## 2024-06-13 NOTE — Progress Notes (Signed)
 I,Wendy Parks, CMA,acting as a Neurosurgeon for Wendy LOISE Slocumb, MD.,have documented all relevant documentation on the behalf of Wendy LOISE Slocumb, MD,as directed by  Wendy LOISE Slocumb, MD while in the presence of Wendy LOISE Slocumb, MD.  Subjective:  Patient ID: Wendy Parks , female    DOB: February 20, 1981 , 43 y.o.   MRN: [id number]  Chief Complaint  Patient presents with   Migraine    Patient presents today for migraine follow up. She reports stopping celexa  & phentermine  due to still having occasional migraine episodes. She would like to rule out if a certain medication could be causing the migraines.  She has not yet completed pap.     HPI Discussed the use of AI scribe software for clinical note transcription with the patient, who gave verbal consent to proceed.  History of Present Illness Wendy Parks is a 44 year old female with migraines who presents for follow-up.  She has ongoing migraines and recently consulted a migraine specialist. Her medication regimen remains unchanged as her regular neurologist had recently adjusted it. She is to wait three months to evaluate the effectiveness of the new regimen. Her migraines are long-lasting, subsiding for a day or two before recurring. Some do not respond to medication and must be endured.  She has stopped taking Celexa  following a medication review. She continues to take felodipine  for blood pressure, Flonase  as needed, and metformin , which she started approximately two weeks ago. She experiences nausea with migraines and uses Zofran  as needed, but she is currently out of it and requires a refill. For migraine management, she uses Maxalt and Nurtec, choosing based on severity. Rizatriptan is preferred initially as it does not require her to lay down, unlike Nurtec, which she uses if the migraine persists the next day. Fioricet was discontinued as instructed by her neurologist.  She has a history of prediabetes and is concerned about PCOS due to  irregular cycles and a history of fibroids. She has not been on metformin  long enough to assess its impact on her cycles. She has not been able to schedule an appointment with her gynecologist despite multiple attempts.  She experiences swelling in her ankles, which she attributes to prolonged sitting at her desk. She acknowledges the need for more physical activity but struggles with time management and discipline. 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   Hypertension This is a new problem. The current episode started more than 1 month ago. The problem has been gradually improving since onset. Pertinent negatives include no blurred vision, neck pain or peripheral edema. Past treatments include calcium channel blockers. The current treatment provides moderate improvement.     Past Medical History:  Diagnosis Date   Esophageal reflux 11/26/2020   Gastritis 11/26/2020   Headache    Migranes   Herpes simplex 12/25/2018   Hx of migraines    Hypertension    Kidney stone      Family History  Problem Relation Age of Onset   Healthy Mother    Dementia Father    Cancer Father      Current Outpatient Medications:    albuterol  (VENTOLIN  HFA) 108 (90 Base) MCG/ACT inhaler, TAKE 2 PUFFS BY MOUTH EVERY 6 HOURS AS NEEDED FOR WHEEZE OR SHORTNESS OF BREATH, Disp: 18 each, Rfl: 3   baclofen (LIORESAL) 10 MG tablet, Take 10 mg by mouth 2 (two) times daily as needed., Disp: , Rfl:    benzonatate  (TESSALON  PERLES) 100  MG capsule, Take 1 capsule (100 mg total) by mouth 3 (three) times daily as needed for cough., Disp: 30 capsule, Rfl: 1   BOTOX 100 units SOLR injection, Inject 100 Units into the muscle every 3 (three) months., Disp: , Rfl:    felodipine  (PLENDIL ) 5 MG 24 hr tablet, Take 1 tablet (5 mg total) by mouth daily., Disp: 90 tablet, Rfl: 1   fluticasone  (FLONASE ) 50 MCG/ACT nasal spray, Place 1 spray into both nostrils daily., Disp: 15.8  mL, Rfl: 0   metFORMIN  (GLUCOPHAGE -XR) 500 MG 24 hr tablet, TAKE 1 TABLET BY MOUTH EVERY DAY WITH BREAKFAST, Disp: 90 tablet, Rfl: 0   rizatriptan (MAXALT) 10 MG tablet, Take 5 mg by mouth as needed for migraine., Disp: , Rfl:    zonisamide (ZONEGRAN) 25 MG capsule, TAKE 4 CAPSULES BY ORAL ROUTE DAILY FOR 30 DAYS, Disp: , Rfl:    cholecalciferol (VITAMIN D3) 25 MCG (1000 UNIT) tablet, Take 1,000 Units by mouth daily. (Patient not taking: Reported on 06/13/2024), Disp: , Rfl:    citalopram  (CELEXA ) 10 MG tablet, Take 1 tablet (10 mg total) by mouth daily. (Patient not taking: Reported on 06/13/2024), Disp: 90 tablet, Rfl: 1   ondansetron  (ZOFRAN ) 4 MG tablet, Take 1 tablet (4 mg total) by mouth every 8 (eight) hours as needed for nausea or vomiting., Disp: 20 tablet, Rfl: 0   Rimegepant Sulfate (NURTEC) 75 MG TBDP, One tab po every day prn, Disp: 16 tablet, Rfl: 2   No Known Allergies   Review of Systems  Constitutional: Negative.   Eyes:  Negative for blurred vision.  Respiratory: Negative.    Cardiovascular: Negative.   Gastrointestinal: Negative.   Musculoskeletal:  Negative for neck pain.  Neurological: Negative.   Psychiatric/Behavioral: Negative.       Today's Vitals   06/13/24 0835  BP: 130/82  Pulse: 89  Temp: 98.1 F (36.7 C)  SpO2: 98%  Weight: 229 lb (103.9 kg)  Height: 5' 5 (1.651 m)   Body mass index is 38.11 kg/m.  Wt Readings from Last 3 Encounters:  06/13/24 229 lb (103.9 kg)  05/03/24 225 lb (102.1 kg)  04/11/24 226 lb 11.2 oz (102.8 kg)     Objective:  Physical Exam Vitals and nursing note reviewed.  Constitutional:      Appearance: Normal appearance. She is obese.  HENT:     Head: Normocephalic and atraumatic.  Eyes:     Extraocular Movements: Extraocular movements intact.  Cardiovascular:     Rate and Rhythm: Normal rate and regular rhythm.     Heart sounds: Normal heart sounds.  Pulmonary:     Effort: Pulmonary effort is normal.     Breath  sounds: Normal breath sounds.  Musculoskeletal:     Cervical back: Normal range of motion.  Skin:    General: Skin is warm.  Neurological:     General: No focal deficit present.     Mental Status: She is alert.  Psychiatric:        Mood and Affect: Mood normal.        Behavior: Behavior normal.         Assessment And Plan:  Chronic migraine with aura without status migrainosus, not intractable Assessment & Plan: Continues to experience migraines despite medication adjustments. Fioricet discontinued due to rebound headache risk. Uses Maxalt initially, switches to Nurtec if persistent. - Refill Zofran  for nausea. - Prescribe Nurtec with samples. - Continue Maxalt for initial treatment.   Prediabetes Assessment & Plan:  On metformin  for less than a month, addressing potential PCOS symptoms. Irregular cycles may relate to insulin  resistance. Discussed lifestyle modifications. - Check kidney function. - Check testosterone levels. - Advise increasing cruciferous vegetables. - Recommend regular exercise and reducing sugar intak  Orders: -     Testosterone -     BMP8+EGFR -     Hemoglobin A1c  Primary hypertension Assessment & Plan: Chronic, fair control.  Goal BP<130/80.  On felodipine . Ankle swelling possibly related to medication or prolonged sitting. - Advise calf pumping exercises and hourly squats. - Follow low sodium diet.   Orders: -     BMP8+EGFR  Leukocytosis, unspecified type -     CBC  Other orders -     Ondansetron  HCl; Take 1 tablet (4 mg total) by mouth every 8 (eight) hours as needed for nausea or vomiting.  Dispense: 20 tablet; Refill: 0 -     Nurtec; One tab po every day prn  Dispense: 16 tablet; Refill: 2  General Health Maintenance Last Pap smear in 2022, difficulty obtaining records. Flu shot received on September 30th. - Confirm flu shot administration.  Return if symptoms worsen or fail to improve.  Patient was given opportunity to ask  questions. Patient verbalized understanding of the plan and was able to repeat key elements of the plan. All questions were answered to their satisfaction.   I, Wendy LOISE Slocumb, MD, have reviewed all documentation for this visit. The documentation on 06/13/24 for the exam, diagnosis, procedures, and orders are all accurate and complete.   IF YOU HAVE BEEN REFERRED TO A SPECIALIST, IT MAY TAKE 1-2 WEEKS TO SCHEDULE/PROCESS THE REFERRAL. IF YOU HAVE NOT HEARD FROM US /SPECIALIST IN TWO WEEKS, PLEASE GIVE US  A CALL AT [phone].   THE PATIENT IS ENCOURAGED TO PRACTICE SOCIAL DISTANCING DUE TO THE COVID-19 PANDEMIC.

## 2024-06-13 NOTE — Assessment & Plan Note (Signed)
 Continues to experience migraines despite medication adjustments. Fioricet discontinued due to rebound headache risk. Uses Maxalt initially, switches to Nurtec if persistent. - Refill Zofran  for nausea. - Prescribe Nurtec with samples. - Continue Maxalt for initial treatment.

## 2024-06-13 NOTE — Patient Instructions (Addendum)

## 2024-06-13 NOTE — Assessment & Plan Note (Signed)
 Chronic, fair control.  Goal BP<130/80.  On felodipine . Ankle swelling possibly related to medication or prolonged sitting. - Advise calf pumping exercises and hourly squats. - Follow low sodium diet.

## 2024-06-14 LAB — CBC
Hematocrit: 40.1 % (ref 34.0–46.6)
Hemoglobin: 13.4 g/dL (ref 11.1–15.9)
MCH: 30.3 pg (ref 26.6–33.0)
MCHC: 33.4 g/dL (ref 31.5–35.7)
MCV: 91 fL (ref 79–97)
Platelets: 337 x10E3/uL (ref 150–450)
RBC: 4.42 x10E6/uL (ref 3.77–5.28)
RDW: 12.5 % (ref 11.7–15.4)
WBC: 9.6 x10E3/uL (ref 3.4–10.8)

## 2024-06-14 LAB — BMP8+EGFR
BUN/Creatinine Ratio: 17 (ref 9–23)
BUN: 14 mg/dL (ref 6–24)
CO2: 21 mmol/L (ref 20–29)
Calcium: 9 mg/dL (ref 8.7–10.2)
Chloride: 104 mmol/L (ref 96–106)
Creatinine, Ser: 0.81 mg/dL (ref 0.57–1.00)
Glucose: 99 mg/dL (ref 70–99)
Potassium: 3.9 mmol/L (ref 3.5–5.2)
Sodium: 138 mmol/L (ref 134–144)
eGFR: 92 mL/min/1.73 (ref >59)

## 2024-06-14 LAB — HEMOGLOBIN A1C
Est. average glucose Bld gHb Est-mCnc: 128 mg/dL
Hgb A1c MFr Bld: 6.1 % — ABNORMAL HIGH (ref 4.8–5.6)

## 2024-06-14 LAB — TESTOSTERONE: Testosterone: 7 ng/dL (ref 4–50)

## 2024-06-17 ENCOUNTER — Ambulatory Visit: Payer: Self-pay | Admitting: Internal Medicine

## 2024-06-18 DIAGNOSIS — M542 Cervicalgia: Secondary | ICD-10-CM | POA: Diagnosis not present

## 2024-06-18 DIAGNOSIS — M791 Myalgia, unspecified site: Secondary | ICD-10-CM | POA: Diagnosis not present

## 2024-06-18 DIAGNOSIS — G518 Other disorders of facial nerve: Secondary | ICD-10-CM | POA: Diagnosis not present

## 2024-06-18 DIAGNOSIS — G43719 Chronic migraine without aura, intractable, without status migrainosus: Secondary | ICD-10-CM | POA: Diagnosis not present

## 2024-06-24 DIAGNOSIS — F4312 Post-traumatic stress disorder, chronic: Secondary | ICD-10-CM | POA: Diagnosis not present

## 2024-07-08 DIAGNOSIS — F4312 Post-traumatic stress disorder, chronic: Secondary | ICD-10-CM | POA: Diagnosis not present

## 2024-07-10 DIAGNOSIS — G43719 Chronic migraine without aura, intractable, without status migrainosus: Secondary | ICD-10-CM | POA: Diagnosis not present

## 2024-07-12 ENCOUNTER — Encounter: Payer: Self-pay | Admitting: Internal Medicine

## 2024-07-13 ENCOUNTER — Emergency Department (HOSPITAL_COMMUNITY)

## 2024-07-13 ENCOUNTER — Emergency Department (HOSPITAL_COMMUNITY)
Admission: EM | Admit: 2024-07-13 | Discharge: 2024-07-13 | Disposition: A | Discharge status: Home / Self Care | Attending: Emergency Medicine | Admitting: Emergency Medicine

## 2024-07-13 ENCOUNTER — Other Ambulatory Visit: Payer: Self-pay

## 2024-07-13 DIAGNOSIS — S161XXA Strain of muscle, fascia and tendon at neck level, initial encounter: Secondary | ICD-10-CM | POA: Insufficient documentation

## 2024-07-13 DIAGNOSIS — S0990XA Unspecified injury of head, initial encounter: Secondary | ICD-10-CM | POA: Insufficient documentation

## 2024-07-13 DIAGNOSIS — I10 Essential (primary) hypertension: Secondary | ICD-10-CM | POA: Diagnosis not present

## 2024-07-13 DIAGNOSIS — Z041 Encounter for examination and observation following transport accident: Secondary | ICD-10-CM | POA: Diagnosis not present

## 2024-07-13 DIAGNOSIS — R0789 Other chest pain: Secondary | ICD-10-CM | POA: Diagnosis not present

## 2024-07-13 DIAGNOSIS — Z79899 Other long term (current) drug therapy: Secondary | ICD-10-CM | POA: Diagnosis not present

## 2024-07-13 DIAGNOSIS — Y92410 Unspecified street and highway as the place of occurrence of the external cause: Secondary | ICD-10-CM | POA: Diagnosis not present

## 2024-07-13 DIAGNOSIS — M25561 Pain in right knee: Secondary | ICD-10-CM | POA: Diagnosis not present

## 2024-07-13 DIAGNOSIS — R079 Chest pain, unspecified: Secondary | ICD-10-CM | POA: Diagnosis not present

## 2024-07-13 DIAGNOSIS — S199XXA Unspecified injury of neck, initial encounter: Secondary | ICD-10-CM | POA: Diagnosis not present

## 2024-07-13 LAB — CBC
HCT: 40.7 % (ref 36.0–46.0)
Hemoglobin: 13.2 g/dL (ref 12.0–15.0)
MCH: 29.6 pg (ref 26.0–34.0)
MCHC: 32.4 g/dL (ref 30.0–36.0)
MCV: 91.3 fL (ref 80.0–100.0)
Platelets: 361 K/uL (ref 150–400)
RBC: 4.46 MIL/uL (ref 3.87–5.11)
RDW: 12.4 % (ref 11.5–15.5)
WBC: 12.9 K/uL — ABNORMAL HIGH (ref 4.0–10.5)
nRBC: 0 % (ref 0.0–0.2)

## 2024-07-13 LAB — BASIC METABOLIC PANEL WITH GFR
Anion gap: 10 (ref 5–15)
BUN: 15 mg/dL (ref 6–20)
CO2: 24 mmol/L (ref 22–32)
Calcium: 9.4 mg/dL (ref 8.9–10.3)
Chloride: 104 mmol/L (ref 98–111)
Creatinine, Ser: 1 mg/dL (ref 0.44–1.00)
GFR, Estimated: >60 mL/min (ref >60)
Glucose, Bld: 101 mg/dL — ABNORMAL HIGH (ref 70–99)
Potassium: 3.8 mmol/L (ref 3.5–5.1)
Sodium: 138 mmol/L (ref 135–145)

## 2024-07-13 LAB — HCG, SERUM, QUALITATIVE: Preg, Serum: NEGATIVE

## 2024-07-13 LAB — TROPONIN T, HIGH SENSITIVITY: Troponin T High Sensitivity: <15 ng/L (ref 0–19)

## 2024-07-13 MED ORDER — ONDANSETRON 4 MG PO TBDP
4.0000 mg | ORAL_TABLET | Freq: Once | ORAL | Status: AC
  Administered 2024-07-13: 4 mg via ORAL
  Filled 2024-07-13: qty 1

## 2024-07-13 NOTE — Discharge Instructions (Addendum)
 Make an appointment to follow-up with your doctor.  Chest x-ray cardiac markers are normal.  X-ray of the right knee without any acute bony abnormalities.  Recommend Exer strength Tylenol  2 tablets every 8 hours.  And Motrin  800 mg every 8 hours as needed.  Return for any new or worse symptoms.
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
## 2024-07-15 ENCOUNTER — Ambulatory Visit: Admitting: Nurse Practitioner

## 2024-07-15 ENCOUNTER — Ambulatory Visit: Payer: Self-pay

## 2024-07-15 ENCOUNTER — Encounter: Payer: Self-pay | Admitting: Nurse Practitioner

## 2024-07-15 DIAGNOSIS — R9431 Abnormal electrocardiogram [ECG] [EKG]: Secondary | ICD-10-CM | POA: Diagnosis not present

## 2024-07-15 DIAGNOSIS — M542 Cervicalgia: Secondary | ICD-10-CM

## 2024-07-15 DIAGNOSIS — R079 Chest pain, unspecified: Secondary | ICD-10-CM | POA: Diagnosis not present

## 2024-07-15 DIAGNOSIS — E66812 Obesity, class 2: Secondary | ICD-10-CM

## 2024-07-15 DIAGNOSIS — I10 Essential (primary) hypertension: Secondary | ICD-10-CM | POA: Diagnosis not present

## 2024-07-15 DIAGNOSIS — Z6838 Body mass index (BMI) 38.0-38.9, adult: Secondary | ICD-10-CM

## 2024-07-15 MED ORDER — CYCLOBENZAPRINE HCL 10 MG PO TABS
10.0000 mg | ORAL_TABLET | Freq: Three times a day (TID) | ORAL | 0 refills | Status: AC | PRN
Start: 2024-07-15 — End: ?

## 2024-07-15 MED ORDER — KETOROLAC TROMETHAMINE 60 MG/2ML IM SOLN
60.0000 mg | Freq: Once | INTRAMUSCULAR | Status: AC
  Administered 2024-07-15: 60 mg via INTRAMUSCULAR

## 2024-07-15 MED ORDER — TRIAMCINOLONE ACETONIDE 40 MG/ML IJ SUSP
40.0000 mg | Freq: Once | INTRAMUSCULAR | Status: AC
  Administered 2024-07-15: 40 mg via INTRAMUSCULAR
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
## 2024-07-22 DIAGNOSIS — F4312 Post-traumatic stress disorder, chronic: Secondary | ICD-10-CM | POA: Diagnosis not present

## 2024-07-28 DIAGNOSIS — M542 Cervicalgia: Secondary | ICD-10-CM | POA: Insufficient documentation

## 2024-07-28 DIAGNOSIS — R9431 Abnormal electrocardiogram [ECG] [EKG]: Secondary | ICD-10-CM | POA: Insufficient documentation

## 2024-07-28 NOTE — Assessment & Plan Note (Signed)
 Chronic, stable.  Continue current medications.
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
## 2024-07-28 NOTE — Assessment & Plan Note (Signed)
 She is encouraged to strive for BMI less than 30 to decrease cardiac risk. Advised to aim for at least 150 minutes of exercise per week.

## 2024-07-28 NOTE — Assessment & Plan Note (Signed)
 ECG shows no STEMI, but abnormal findings. Previous ECGs showed nonspecific T wave abnormalities. Current findings not concerning. - Will have Doctor Jarold review ECG findings.
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
## 2024-07-29 ENCOUNTER — Ambulatory Visit: Admitting: Neurology

## 2024-07-31 ENCOUNTER — Encounter: Payer: Self-pay | Admitting: Physician Assistant

## 2024-07-31 ENCOUNTER — Ambulatory Visit: Attending: Physician Assistant | Admitting: Physician Assistant

## 2024-07-31 VITALS — BP 127/83 | HR 92 | Ht 65.0 in | Wt 230.2 lb

## 2024-07-31 DIAGNOSIS — E7800 Pure hypercholesterolemia, unspecified: Secondary | ICD-10-CM | POA: Diagnosis not present

## 2024-07-31 DIAGNOSIS — R0602 Shortness of breath: Secondary | ICD-10-CM | POA: Diagnosis not present

## 2024-07-31 DIAGNOSIS — I10 Essential (primary) hypertension: Secondary | ICD-10-CM

## 2024-07-31 DIAGNOSIS — R072 Precordial pain: Secondary | ICD-10-CM

## 2024-07-31 MED ORDER — METOPROLOL TARTRATE 100 MG PO TABS: 100.0000 mg | ORAL_TABLET | Freq: Once | ORAL | 0 refills | Status: AC
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
## 2024-07-31 NOTE — Patient Instructions (Signed)
 Medication Instructions:  Your physician recommends that you continue on your current medications as directed. Please refer to the Current Medication list given to you today.  *If you need a refill on your cardiac medications before your next appointment, please call your pharmacy*  Lab Work: TODAY:  PRO BNP  If you have labs (blood work) drawn today and your tests are completely normal, you will receive your results only by: MyChart Message (if you have MyChart) OR A paper copy in the mail If you have any lab test that is abnormal or we need to change your treatment, we will call you to review the results.  Testing/Procedures: Your physician has requested that you have cardiac CT. Cardiac computed tomography (CT) is a painless test that uses an x-ray machine to take clear, detailed pictures of your heart. For further information please visit [email or web address] Please follow instruction sheet BELOW:    Your cardiac CT will be scheduled at one of the below locations:   [facility] [address] [place], [place] [address] [phone] (Severe contrast allergies only)  OR   [facility] [address] [place], [place] [address] [phone]  OR   [facility] [address] [place], [place] [address] [phone]  OR   [facility] [address]  [place], [place] [address]  OR   [facility] [address] [place], [place] [phone]  If scheduled at [facility], please arrive at the [facility] and Children's Entrance (Entrance C2) of [facility] 30 minutes prior to test start time. You can use the FREE valet parking offered at entrance C (encouraged to control the heart rate for the test)  Proceed to the [facility] Radiology Department (first floor) to check-in and test prep.  All radiology patients and guests should use entrance C2 at [facility],  accessed from [facility], even though the hospital's physical address listed is [address]  If scheduled at the Heart and Vascular Tower at [address], please enter the parking lot using the [address] entrance and use the FREE valet service at the patient drop-off area. Enter the building and check-in with registration on the main floor.  If scheduled at [facility], please arrive to the Heart and Vascular Center 15 mins early for check-in and test prep.  There is spacious parking and easy access to the radiology department from the [facility] Heart and Vascular entrance. Please enter here and check-in with the desk attendant.   If scheduled at [facility], please arrive 30 minutes early for check-in and test prep.  Please follow these instructions carefully (unless otherwise directed):  An IV will be required for this test and Nitroglycerin will be given.  Hold all erectile dysfunction medications at least 3 days (72 hrs) prior to test. (Ie viagra, cialis, sildenafil, tadalafil, etc)   On the Night Before the Test: Be sure to Drink plenty of water. Do not consume any caffeinated/decaffeinated beverages or chocolate 12 hours prior to your test. Do not take any antihistamines 12 hours prior to your test.   On the Day of the Test: Drink plenty of water until 1 hour prior to the test. Do not eat any food 1 hour prior to test. You may take your regular medications prior to the test.  Take metoprolol  (Lopressor ) 100 MG two hours prior to test. THIS HAS BEEN SENT TO YOUR  PHARMACY  FEMALES- please wear underwire-free bra if available, avoid dresses & tight clothing        After the Test: Drink plenty of water. After receiving IV contrast, you may experience a mild flushed feeling. This is normal. On occasion, you may experience a mild rash up to 24 hours after the test. This is not dangerous. If this occurs, you can take Benadryl   25 mg, Zyrtec, Claritin, or Allegra and increase your fluid intake. (Patients taking Tikosyn should avoid Benadryl , and may take Zyrtec, Claritin, or Allegra) If you experience trouble breathing, this can be serious. If it is severe call 911 IMMEDIATELY. If it is mild, please call our office.  We will call to schedule your test 2-4 weeks out understanding that some insurance companies will need an authorization prior to the service being performed.   For more information and frequently asked questions, please visit our website : [email or web address]  For non-scheduling related questions, please contact the cardiac imaging nurse navigator should you have any questions/concerns: Cardiac Imaging Nurse Navigators Direct Office Dial: [phone]   For scheduling needs, including cancellations and rescheduling, please call Brittany, [phone]  Follow-Up: At [facility], you and your health needs are our priority.  As part of our continuing mission to provide you with exceptional heart care, our providers are all part of one team.  This team includes your primary Cardiologist (physician) and Advanced Practice Providers or APPs (Physician Assistants and Nurse Practitioners) who all work together to provide you with the care you need, when you need it.  Your next appointment:   2-3  month(s)  Provider:   Glendia Ferrier, PA-C          We recommend signing up for the patient portal called MyChart.  Sign up information is provided on this After Visit Summary.  MyChart is used to connect with patients for Virtual Visits (Telemedicine).  Patients are able to view lab/test results, encounter notes, upcoming appointments, etc.  Non-urgent messages can be sent to your provider as well.   To learn more about what you can do with MyChart, go to [email or web address].   Other Instructions

## 2024-08-01 ENCOUNTER — Ambulatory Visit: Payer: Self-pay | Admitting: Physician Assistant

## 2024-08-01 DIAGNOSIS — I2510 Atherosclerotic heart disease of native coronary artery without angina pectoris: Secondary | ICD-10-CM

## 2024-08-01 LAB — PRO B NATRIURETIC PEPTIDE: NT-Pro BNP: <36 pg/mL (ref 0–130)

## 2024-08-05 DIAGNOSIS — F4312 Post-traumatic stress disorder, chronic: Secondary | ICD-10-CM | POA: Diagnosis not present

## 2024-08-07 ENCOUNTER — Encounter (HOSPITAL_COMMUNITY): Payer: Self-pay

## 2024-08-07 DIAGNOSIS — G518 Other disorders of facial nerve: Secondary | ICD-10-CM | POA: Diagnosis not present

## 2024-08-07 DIAGNOSIS — M791 Myalgia, unspecified site: Secondary | ICD-10-CM | POA: Diagnosis not present

## 2024-08-07 DIAGNOSIS — M542 Cervicalgia: Secondary | ICD-10-CM | POA: Diagnosis not present

## 2024-08-07 DIAGNOSIS — G43719 Chronic migraine without aura, intractable, without status migrainosus: Secondary | ICD-10-CM | POA: Diagnosis not present

## 2024-08-09 ENCOUNTER — Encounter: Payer: Self-pay | Admitting: Physician Assistant

## 2024-08-09 ENCOUNTER — Ambulatory Visit (HOSPITAL_COMMUNITY)
Admission: RE | Admit: 2024-08-09 | Discharge: 2024-08-09 | Disposition: A | Discharge status: Home / Self Care | Source: Ambulatory Visit | Attending: Cardiovascular Disease | Admitting: Cardiovascular Disease

## 2024-08-09 DIAGNOSIS — E7800 Pure hypercholesterolemia, unspecified: Secondary | ICD-10-CM | POA: Insufficient documentation

## 2024-08-09 DIAGNOSIS — I10 Essential (primary) hypertension: Secondary | ICD-10-CM | POA: Diagnosis not present

## 2024-08-09 DIAGNOSIS — R072 Precordial pain: Secondary | ICD-10-CM | POA: Diagnosis not present

## 2024-08-09 DIAGNOSIS — R0602 Shortness of breath: Secondary | ICD-10-CM | POA: Diagnosis not present

## 2024-08-09 DIAGNOSIS — I2510 Atherosclerotic heart disease of native coronary artery without angina pectoris: Secondary | ICD-10-CM | POA: Insufficient documentation

## 2024-08-09 MED ORDER — ROSUVASTATIN CALCIUM 5 MG PO TABS: 5.0000 mg | ORAL_TABLET | Freq: Every day | ORAL | 3 refills | Status: AC

## 2024-08-09 MED ORDER — IOHEXOL 350 MG/ML SOLN
100.0000 mL | Freq: Once | INTRAVENOUS | Status: AC | PRN
  Administered 2024-08-09: 100 mL via INTRAVENOUS

## 2024-08-09 MED ORDER — NITROGLYCERIN 0.4 MG SL SUBL
0.8000 mg | SUBLINGUAL_TABLET | Freq: Once | SUBLINGUAL | Status: AC
  Administered 2024-08-09: 0.8 mg via SUBLINGUAL

## 2024-08-09 NOTE — Addendum Note (Signed)
 Addended by: JOSHUA ANDREZ PARAS on: 08/09/2024 03:35 PM   Modules accepted: Orders

## 2024-08-11 NOTE — Addendum Note (Signed)
 Addended byBETHA FERRIER, GLENDIA T on: 08/11/2024 09:21 PM   Modules accepted: Orders

## 2024-08-12 DIAGNOSIS — Z01419 Encounter for gynecological examination (general) (routine) without abnormal findings: Secondary | ICD-10-CM | POA: Diagnosis not present

## 2024-08-12 DIAGNOSIS — N920 Excessive and frequent menstruation with regular cycle: Secondary | ICD-10-CM | POA: Diagnosis not present

## 2024-08-12 DIAGNOSIS — Z8742 Personal history of other diseases of the female genital tract: Secondary | ICD-10-CM | POA: Diagnosis not present

## 2024-08-12 DIAGNOSIS — Z113 Encounter for screening for infections with a predominantly sexual mode of transmission: Secondary | ICD-10-CM | POA: Diagnosis not present

## 2024-08-16 ENCOUNTER — Ambulatory Visit (HOSPITAL_COMMUNITY)
Admission: RE | Admit: 2024-08-16 | Discharge: 2024-08-16 | Disposition: A | Discharge status: Home / Self Care | Source: Ambulatory Visit | Attending: Cardiology | Admitting: Cardiology

## 2024-08-16 DIAGNOSIS — R9431 Abnormal electrocardiogram [ECG] [EKG]: Secondary | ICD-10-CM | POA: Insufficient documentation

## 2024-08-16 DIAGNOSIS — I10 Essential (primary) hypertension: Secondary | ICD-10-CM | POA: Diagnosis not present

## 2024-08-17 LAB — ECHOCARDIOGRAM COMPLETE
Area-P 1/2: 4.07 cm2
S' Lateral: 2.4 cm

## 2024-08-19 DIAGNOSIS — D259 Leiomyoma of uterus, unspecified: Secondary | ICD-10-CM | POA: Diagnosis not present

## 2024-08-19 DIAGNOSIS — Z113 Encounter for screening for infections with a predominantly sexual mode of transmission: Secondary | ICD-10-CM | POA: Diagnosis not present

## 2024-08-23 LAB — HM PAP SMEAR: HPV Aptima: NEGATIVE

## 2024-09-05 ENCOUNTER — Encounter: Payer: Self-pay | Admitting: Internal Medicine

## 2024-09-05 ENCOUNTER — Ambulatory Visit: Payer: BC Managed Care – PPO | Admitting: Internal Medicine

## 2024-09-05 VITALS — BP 118/80 | HR 67 | Temp 98.1°F | Ht 65.0 in | Wt 225.2 lb

## 2024-09-05 DIAGNOSIS — E7800 Pure hypercholesterolemia, unspecified: Secondary | ICD-10-CM

## 2024-09-05 DIAGNOSIS — I10 Essential (primary) hypertension: Secondary | ICD-10-CM

## 2024-09-05 DIAGNOSIS — E559 Vitamin D deficiency, unspecified: Secondary | ICD-10-CM | POA: Diagnosis not present

## 2024-09-05 DIAGNOSIS — R7303 Prediabetes: Secondary | ICD-10-CM | POA: Diagnosis not present

## 2024-09-05 DIAGNOSIS — Z0000 Encounter for general adult medical examination without abnormal findings: Secondary | ICD-10-CM | POA: Diagnosis not present

## 2024-09-05 DIAGNOSIS — D259 Leiomyoma of uterus, unspecified: Secondary | ICD-10-CM | POA: Diagnosis not present

## 2024-09-05 NOTE — Patient Instructions (Signed)
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
## 2024-09-06 LAB — BMP8+EGFR
BUN/Creatinine Ratio: 16 (ref 9–23)
BUN: 13 mg/dL (ref 6–24)
CO2: 22 mmol/L (ref 20–29)
Calcium: 9.4 mg/dL (ref 8.7–10.2)
Chloride: 102 mmol/L (ref 96–106)
Creatinine, Ser: 0.82 mg/dL (ref 0.57–1.00)
Glucose: 87 mg/dL (ref 70–99)
Potassium: 4.2 mmol/L (ref 3.5–5.2)
Sodium: 138 mmol/L (ref 134–144)
eGFR: 91 mL/min/1.73

## 2024-09-06 LAB — MICROALBUMIN / CREATININE URINE RATIO
Creatinine, Urine: 376.5 mg/dL
Microalb/Creat Ratio: 9 mg/g{creat} (ref 0–29)
Microalbumin, Urine: 35.6 ug/mL

## 2024-09-06 LAB — MICROSCOPIC EXAMINATION: Casts: NONE SEEN /LPF

## 2024-09-06 LAB — URINALYSIS, COMPLETE
Bilirubin, UA: NEGATIVE
Glucose, UA: NEGATIVE
Leukocytes,UA: NEGATIVE
Nitrite, UA: NEGATIVE
Urobilinogen, Ur: 1 mg/dL (ref 0.2–1.0)
pH, UA: 6.5 (ref 5.0–7.5)

## 2024-09-06 LAB — CBC
Hematocrit: 41.5 % (ref 34.0–46.6)
Hemoglobin: 13.7 g/dL (ref 11.1–15.9)
MCH: 30 pg (ref 26.6–33.0)
MCHC: 33 g/dL (ref 31.5–35.7)
MCV: 91 fL (ref 79–97)
Platelets: 352 x10E3/uL (ref 150–450)
RBC: 4.56 x10E6/uL (ref 3.77–5.28)
RDW: 12.4 % (ref 11.7–15.4)
WBC: 11.7 x10E3/uL — ABNORMAL HIGH (ref 3.4–10.8)

## 2024-09-06 LAB — THYROID PANEL
Free Thyroxine Index: 2.3 (ref 1.2–4.9)
T3 Uptake Ratio: 26 % (ref 24–39)
T4, Total: 8.7 ug/dL (ref 4.5–12.0)

## 2024-09-06 LAB — VITAMIN D 25 HYDROXY (VIT D DEFICIENCY, FRACTURES): Vit D, 25-Hydroxy: 34.6 ng/mL (ref 30.0–100.0)

## 2024-09-06 LAB — INSULIN, RANDOM: INSULIN: 11.3 u[IU]/mL (ref 2.6–24.9)

## 2024-09-08 DIAGNOSIS — E559 Vitamin D deficiency, unspecified: Secondary | ICD-10-CM | POA: Insufficient documentation

## 2024-09-08 DIAGNOSIS — E7800 Pure hypercholesterolemia, unspecified: Secondary | ICD-10-CM | POA: Insufficient documentation

## 2024-09-08 NOTE — Assessment & Plan Note (Signed)
 Chronic, currently on metformin .  Previous labs reviewed, her A1c has been elevated in the past. I will check an A1c today. Reminded to avoid refined sugars including sugary drinks/foods and processed meats including bacon, sausages and deli meats.

## 2024-09-08 NOTE — Assessment & Plan Note (Signed)
 Vitamin D  supplementation is sporadic. Levels need checking as per GYN request. - Ordered vitamin D  level tes

## 2024-09-08 NOTE — Assessment & Plan Note (Signed)
 Chronic, followed by Gyn. Multiple fibroids, largest 6.3 cm. Discussed thyroid  function link. - Ordered TSH test. - Avoid spinach and limit kale due to kidney stones. - Consider dandelion root tea with cinnamon. - Incorporate more cruciferous veggies into diet.

## 2024-09-08 NOTE — Assessment & Plan Note (Signed)
 BMI 37 with underlying HTN. She is encouraged to strive to lose ten percent of her body weight to decrease cardiac risk.  - Aim for at least 150 minutes of exercise per week, including strength training.  - Decrease intake of processed foods.

## 2024-09-08 NOTE — Assessment & Plan Note (Signed)
 Chronic, fair control. Goal BP <130/80.  Managed with felodipine .  - Follow low sodium diet.  - Follow up in six months.

## 2024-09-08 NOTE — Assessment & Plan Note (Signed)
 Chronic, started on rosuvastatin  by Cardiology.   - Follow heart healthy lifestyle.  - Scheduled to have repeat labs with Cardiology

## 2024-09-08 NOTE — Assessment & Plan Note (Signed)

## 2024-09-09 ENCOUNTER — Ambulatory Visit (HOSPITAL_COMMUNITY)

## 2024-09-14 ENCOUNTER — Other Ambulatory Visit: Payer: Self-pay | Admitting: Internal Medicine

## 2024-09-16 ENCOUNTER — Ambulatory Visit: Payer: Self-pay | Admitting: Internal Medicine

## 2024-09-23 LAB — LIPID PANEL
Chol/HDL Ratio: 2.3 ratio (ref 0.0–4.4)
Cholesterol, Total: 124 mg/dL (ref 100–199)
HDL: 53 mg/dL
LDL Chol Calc (NIH): 55 mg/dL (ref 0–99)
Triglycerides: 82 mg/dL (ref 0–149)
VLDL Cholesterol Cal: 16 mg/dL (ref 5–40)

## 2024-09-23 LAB — ALT: ALT: 14 [IU]/L (ref 0–32)

## 2024-10-02 ENCOUNTER — Ambulatory Visit: Admitting: Physician Assistant

## 2024-10-14 ENCOUNTER — Ambulatory Visit: Admitting: Neurology

## 2025-03-12 ENCOUNTER — Ambulatory Visit: Payer: Self-pay | Admitting: Internal Medicine

## 2025-09-08 ENCOUNTER — Encounter: Payer: Self-pay | Admitting: Internal Medicine
